# Patient Record
Sex: Female | Born: 1960 | Race: Black or African American | Hispanic: No | Smoking: Never smoker
Health system: Southern US, Community
[De-identification: ages and names within clinical notes are randomized; demographics above are authoritative.]

## PROBLEM LIST (undated history)

## (undated) DIAGNOSIS — G709 Myoneural disorder, unspecified: Secondary | ICD-10-CM

## (undated) DIAGNOSIS — E119 Type 2 diabetes mellitus without complications: Secondary | ICD-10-CM

## (undated) DIAGNOSIS — M1990 Unspecified osteoarthritis, unspecified site: Secondary | ICD-10-CM

## (undated) DIAGNOSIS — E785 Hyperlipidemia, unspecified: Secondary | ICD-10-CM

## (undated) DIAGNOSIS — I10 Essential (primary) hypertension: Secondary | ICD-10-CM

## (undated) DIAGNOSIS — Z9989 Dependence on other enabling machines and devices: Secondary | ICD-10-CM

## (undated) DIAGNOSIS — F419 Anxiety disorder, unspecified: Secondary | ICD-10-CM

## (undated) DIAGNOSIS — R0902 Hypoxemia: Secondary | ICD-10-CM

## (undated) DIAGNOSIS — T7840XA Allergy, unspecified, initial encounter: Secondary | ICD-10-CM

## (undated) DIAGNOSIS — I219 Acute myocardial infarction, unspecified: Secondary | ICD-10-CM

## (undated) DIAGNOSIS — G4730 Sleep apnea, unspecified: Secondary | ICD-10-CM

## (undated) DIAGNOSIS — E559 Vitamin D deficiency, unspecified: Secondary | ICD-10-CM

## (undated) DIAGNOSIS — E079 Disorder of thyroid, unspecified: Secondary | ICD-10-CM

## (undated) HISTORY — DX: Hypoxemia: R09.02

## (undated) HISTORY — DX: Acute myocardial infarction, unspecified: I21.9

## (undated) HISTORY — DX: Anxiety disorder, unspecified: F41.9

## (undated) HISTORY — DX: Hyperlipidemia, unspecified: E78.5

## (undated) HISTORY — DX: Allergy, unspecified, initial encounter: T78.40XA

## (undated) HISTORY — DX: Disorder of thyroid, unspecified: E07.9

## (undated) HISTORY — DX: Myoneural disorder, unspecified: G70.9

## (undated) HISTORY — DX: Sleep apnea, unspecified: G47.30

## (undated) HISTORY — DX: Vitamin D deficiency, unspecified: E55.9

## (undated) HISTORY — PX: TONSILLECTOMY: SUR1361

## (undated) HISTORY — DX: Dependence on other enabling machines and devices: Z99.89

## (undated) HISTORY — PX: ABDOMINAL HYSTERECTOMY: SHX81

## (undated) HISTORY — PX: TRIGGER FINGER RELEASE: SHX641

## (undated) HISTORY — DX: Type 2 diabetes mellitus without complications: E11.9

## (undated) HISTORY — DX: Unspecified osteoarthritis, unspecified site: M19.90

## (undated) HISTORY — PX: TOOTH EXTRACTION: SUR596

## (undated) HISTORY — DX: Essential (primary) hypertension: I10

---

## 2018-02-25 DIAGNOSIS — R5383 Other fatigue: Secondary | ICD-10-CM | POA: Diagnosis not present

## 2018-02-25 DIAGNOSIS — E7800 Pure hypercholesterolemia, unspecified: Secondary | ICD-10-CM | POA: Diagnosis not present

## 2018-02-25 DIAGNOSIS — E119 Type 2 diabetes mellitus without complications: Secondary | ICD-10-CM | POA: Diagnosis not present

## 2018-02-25 DIAGNOSIS — I10 Essential (primary) hypertension: Secondary | ICD-10-CM | POA: Diagnosis not present

## 2018-03-04 DIAGNOSIS — E7800 Pure hypercholesterolemia, unspecified: Secondary | ICD-10-CM | POA: Diagnosis not present

## 2018-03-04 DIAGNOSIS — E032 Hypothyroidism due to medicaments and other exogenous substances: Secondary | ICD-10-CM | POA: Diagnosis not present

## 2018-03-04 DIAGNOSIS — I10 Essential (primary) hypertension: Secondary | ICD-10-CM | POA: Diagnosis not present

## 2018-03-04 DIAGNOSIS — E119 Type 2 diabetes mellitus without complications: Secondary | ICD-10-CM | POA: Diagnosis not present

## 2018-05-13 DIAGNOSIS — I10 Essential (primary) hypertension: Secondary | ICD-10-CM | POA: Diagnosis not present

## 2018-05-13 DIAGNOSIS — B029 Zoster without complications: Secondary | ICD-10-CM | POA: Diagnosis not present

## 2018-10-07 DIAGNOSIS — E0590 Thyrotoxicosis, unspecified without thyrotoxic crisis or storm: Secondary | ICD-10-CM | POA: Diagnosis not present

## 2018-10-07 DIAGNOSIS — Z9989 Dependence on other enabling machines and devices: Secondary | ICD-10-CM | POA: Diagnosis not present

## 2018-10-07 DIAGNOSIS — I10 Essential (primary) hypertension: Secondary | ICD-10-CM | POA: Diagnosis not present

## 2018-10-07 DIAGNOSIS — R5383 Other fatigue: Secondary | ICD-10-CM | POA: Diagnosis not present

## 2018-10-07 DIAGNOSIS — E7800 Pure hypercholesterolemia, unspecified: Secondary | ICD-10-CM | POA: Diagnosis not present

## 2018-10-07 DIAGNOSIS — E876 Hypokalemia: Secondary | ICD-10-CM | POA: Diagnosis not present

## 2018-10-07 DIAGNOSIS — E119 Type 2 diabetes mellitus without complications: Secondary | ICD-10-CM | POA: Diagnosis not present

## 2018-10-07 LAB — TSH: TSH: 0.03 — AB (ref <=5.90)

## 2018-12-24 DIAGNOSIS — H10029 Other mucopurulent conjunctivitis, unspecified eye: Secondary | ICD-10-CM | POA: Diagnosis not present

## 2018-12-24 DIAGNOSIS — R05 Cough: Secondary | ICD-10-CM | POA: Diagnosis not present

## 2018-12-24 DIAGNOSIS — H6693 Otitis media, unspecified, bilateral: Secondary | ICD-10-CM | POA: Diagnosis not present

## 2018-12-24 DIAGNOSIS — E119 Type 2 diabetes mellitus without complications: Secondary | ICD-10-CM | POA: Diagnosis not present

## 2019-02-18 DIAGNOSIS — M79645 Pain in left finger(s): Secondary | ICD-10-CM | POA: Diagnosis not present

## 2019-02-18 DIAGNOSIS — M79644 Pain in right finger(s): Secondary | ICD-10-CM | POA: Diagnosis not present

## 2019-02-18 DIAGNOSIS — E119 Type 2 diabetes mellitus without complications: Secondary | ICD-10-CM | POA: Diagnosis not present

## 2019-02-23 ENCOUNTER — Encounter: Payer: Self-pay | Admitting: Family Medicine

## 2019-02-28 ENCOUNTER — Other Ambulatory Visit: Payer: Self-pay

## 2019-02-28 ENCOUNTER — Encounter: Payer: Self-pay | Admitting: Family Medicine

## 2019-02-28 ENCOUNTER — Ambulatory Visit (INDEPENDENT_AMBULATORY_CARE_PROVIDER_SITE_OTHER): Payer: BLUE CROSS/BLUE SHIELD | Admitting: Family Medicine

## 2019-02-28 ENCOUNTER — Telehealth: Payer: Self-pay | Admitting: Family Medicine

## 2019-02-28 VITALS — BP 128/79 | HR 96 | Temp 98.2°F | Resp 17 | Ht 64.57 in | Wt 211.8 lb

## 2019-02-28 DIAGNOSIS — Z1211 Encounter for screening for malignant neoplasm of colon: Secondary | ICD-10-CM

## 2019-02-28 DIAGNOSIS — E039 Hypothyroidism, unspecified: Secondary | ICD-10-CM | POA: Diagnosis not present

## 2019-02-28 DIAGNOSIS — E119 Type 2 diabetes mellitus without complications: Secondary | ICD-10-CM

## 2019-02-28 DIAGNOSIS — G4733 Obstructive sleep apnea (adult) (pediatric): Secondary | ICD-10-CM | POA: Diagnosis not present

## 2019-02-28 DIAGNOSIS — Z1389 Encounter for screening for other disorder: Secondary | ICD-10-CM | POA: Diagnosis not present

## 2019-02-28 DIAGNOSIS — I10 Essential (primary) hypertension: Secondary | ICD-10-CM

## 2019-02-28 LAB — GLUCOSE, POCT (MANUAL RESULT ENTRY): POC Glucose: 143 mg/dl — AB (ref 70–99)

## 2019-02-28 LAB — POCT URINALYSIS DIP (CLINITEK)
Bilirubin, UA: NEGATIVE
Blood, UA: NEGATIVE
Glucose, UA: 500 mg/dL — AB
Ketones, POC UA: NEGATIVE mg/dL
LEUKOCYTES UA: NEGATIVE
Nitrite, UA: NEGATIVE
POC PROTEIN,UA: NEGATIVE
Spec Grav, UA: 1.025 (ref 1.010–1.025)
Urobilinogen, UA: 0.2 E.U./dL
pH, UA: 5.5 (ref 5.0–8.0)

## 2019-02-28 MED ORDER — FREESTYLE LIBRE SENSOR SYSTEM MISC: 3 refills | Status: DC

## 2019-02-28 MED ORDER — AMLODIPINE BESYLATE 10 MG PO TABS: 10.0000 mg | ORAL_TABLET | Freq: Every day | ORAL | 3 refills | Status: DC

## 2019-02-28 NOTE — Progress Notes (Signed)
Melanie Steele, is a 59 y.o. female  MWN:[id number]  QIH:[id number]  DOB - 1961-02-10  CC:  Chief Complaint  Patient presents with  . Establish Care  . Diabetes  . Hypertension  . Hyperlipidemia       HPI: Melanie Steele is a 58 y.o. female is here today to establish care.   Melanie Steele has Essential hypertension; Type 2 diabetes mellitus without complication, without long-term current use of insulin ([place]); and Acquired hypothyroidism.  Recently relocated to [place]. Patient is employed with [employer]. Previously followed by [facility] in [place], [place]. Scheduled to have two surgeries to repair trigger fingers-followed by [facility]. Scheduled for an eye exam at [place].   Diabetes Last A1C 8.2 10/07/18. Current diabetes regimen includes Invokana and Victoza. She reports an intolerance of metformin. She has been prescribed current regimen for over 1 year.  She monitors her glucose at home and home readings have been between 120 and 140s.  She denies any prior history of renal disease. Diabetic eye exam was 06/15/17. Urine microalbumin 10/07/2018. Endorse occasional mild neuropathy. She suffered from a shingles outbreak last year and endorse neuropathic pain of her arm secondary to resolved shingle outbreak. She is prescribed Gabapentin as needed for pain.   Hypertension/Hyperlipidemia  Blood pressure currently managed with amlodipine 10 mg, hydrochlorothiazide 25 mg, metoprolol 50 mg.  She has a blood pressure cuff at home she does not routinely monitor her blood pressure.  She denies any headaches, dizziness, chest pain, weakness.  Endorses efforts to here to a low-sodium diet.  No routine physical activity however is making efforts to become more physically active. Current Body mass index is 35.72 kg/m.  She is currently on statin therapy for management of hyperlipidemia.    OSA  Patient was diagnosed with obstructive sleep apnea in 2009.  Was prescribed a CPAP  machine although endorses that the mask never fit appropriately for discontinued seeing the machine consistently.  She has not had a sleep study since 2009.  She request a referral today.  Other overdue health maintenance Colonscopy, interested in Cologuard. No family history of GI cancers or colon cancer. She suffers from acquired hypothyroidism which is currently managed by Armour 180 mg.  Last Thyroid study performed in October which was non- therapeutic with depressed levels of TSH.   Current medications: Current Outpatient Medications:  .  amLODipine (NORVASC) 10 MG tablet, Take 1 tablet by mouth daily., Disp: , Rfl:  .  canagliflozin (INVOKANA) 300 MG TABS tablet, Take 1 tablet by mouth daily., Disp: , Rfl:  .  Continuous Blood Gluc Sensor (Tranquillity) MISC, by Does not apply route., Disp: , Rfl:  .  EPINEPHrine 0.3 mg/0.3 mL IJ SOAJ injection, epinephrine 0.3 mg/0.3 mL injection, auto-injector  Inject 0.3 mg IM as needed, Disp: , Rfl:  .  fluticasone (FLONASE) 50 MCG/ACT nasal spray, Place 1 spray into both nostrils daily., Disp: , Rfl:  .  gabapentin (NEURONTIN) 100 MG capsule, Take 1 capsule by mouth 3 (three) times daily as needed., Disp: , Rfl:  .  glucose blood test strip, Dispense and use as prescribed., Disp: , Rfl:  .  hydrochlorothiazide (HYDRODIURIL) 25 MG tablet, Take 1 tablet by mouth daily., Disp: , Rfl:  .  Insulin Pen Needle (NOVOFINE) 30G X 8 MM MISC, by Does not apply route., Disp: , Rfl:  .  liraglutide (VICTOZA) 18 MG/3ML SOPN, Inject 0.3 mLs into the skin daily., Disp: , Rfl:  .  metoprolol succinate (TOPROL-XL) 50 MG 24 hr tablet, Take 1 tablet by mouth daily., Disp: , Rfl:  .  rosuvastatin (CRESTOR) 5 MG tablet, Take 1 tablet by mouth daily., Disp: , Rfl:  .  thyroid (ARMOUR THYROID) 180 MG tablet, Take 1 tablet by mouth every other day., Disp: , Rfl:  .  fexofenadine (ALLEGRA) 180 MG tablet, Take 1 tablet by mouth daily., Disp: , Rfl:     Pertinent family medical history: family history includes Diabetes in her mother; Hypertension in her mother and sister; Stroke in her father.   Allergies  Allergen Reactions  . Lisinopril Anaphylaxis  . Other Anaphylaxis  . Linagliptin Other (See Comments)    Upset stomach  . Metformin And Related Diarrhea    Social History   Socioeconomic History  . Marital status: Widowed    Spouse name: Not on file  . Number of children: Not on file  . Years of education: Not on file  . Highest education level: Not on file  Occupational History  . Not on file  Social Needs  . Financial resource strain: Not on file  . Food insecurity:    Worry: Not on file    Inability: Not on file  . Transportation needs:    Medical: Not on file    Non-medical: Not on file  Tobacco Use  . Smoking status: Never Smoker  . Smokeless tobacco: Never Used  Substance and Sexual Activity  . Alcohol use: Yes  . Drug use: Not on file  . Sexual activity: Not on file  Lifestyle  . Physical activity:    Days per week: Not on file    Minutes per session: Not on file  . Stress: Not on file  Relationships  . Social connections:    Talks on phone: Not on file    Gets together: Not on file    Attends religious service: Not on file    Active member of club or organization: Not on file    Attends meetings of clubs or organizations: Not on file    Relationship status: Not on file  . Intimate partner violence:    Fear of current or ex partner: Not on file    Emotionally abused: Not on file    Physically abused: Not on file    Forced sexual activity: Not on file  Other Topics Concern  . Not on file  Social History Narrative  . Not on file    Review of Systems: Pertinent negatives listed in HPI  Objective:  There were no vitals filed for this visit.  BP Readings from Last 3 Encounters:  No data found for BP    There were no vitals filed for this visit.    Physical Exam: Constitutional: Patient  appears well-developed and well-nourished. No distress. HENT: Normocephalic, atraumatic, External right and left ear normal. Oropharynx is clear and moist.  Eyes: Conjunctivae and EOM are normal. PERRLA, no scleral icterus. Neck: Normal ROM. Neck supple. No JVD. No tracheal deviation. No thyromegaly. CVS: RRR, S1/S2 +, no murmurs, no gallops, no carotid bruit.  Pulmonary: Effort and breath sounds normal, no stridor, rhonchi, wheezes, rales.  Abdominal: Soft. BS +, no distension, tenderness, rebound or guarding.  Musculoskeletal: Normal range of motion. No edema and no tenderness.  Neuro: Alert. Normal muscle tone coordination. Normal gait. BUE and BLE strength 5/5. Bilateral hand grips symmetrical. No cranial nerve deficit. Skin: Skin is warm and dry. No rash noted. Not diaphoretic. No erythema. No pallor.  Psychiatric: Normal mood and affect. Behavior, judgment, thought content normal.     Assessment and plan:  1. Type 2 diabetes mellitus without complication, without long-term current use of insulin (HCC) Last A1C 8.2. Recent home readings stable. No medication changes today. Continue Invokana and Victoza. Recommend engaging in routine physical activity with a goal of 150 minutes per week, increasing intake of vegetables, fruits, fiber, and selecting lean cuts of meat. Checking:   - Comprehensive metabolic panel - Hemoglobin A1c - Glucose (CBG) - CBC with Differential  2. Essential hypertension, Controlled  -Continue amlodipine, hydrochlorothiazide, and metoprolol We have discussed target BP range and blood pressure goal. I have advised patient to check BP regularly and to call us back or report to clinic if the numbers are consistently higher than 140/90. We discussed the importance of compliance with medical therapy and DASH diet recommended, consequences of uncontrolled hypertension discussed.   3. Acquired hypothyroidism Currently prescribed Armour Thyroid 180 mg once daily. -  TSH  4. Screen for colon cancer - Cologuard ordered.  5. Screening for blood or protein in urine - POCT URINALYSIS DIP (CLINITEK)  6. OSA, sleep study 2009.  Ordered a split night sleep study evaluation of CPAP.   Marland Kitchen Orders Placed This Encounter  Procedures  . Comprehensive metabolic panel  . Hemoglobin A1c  . CBC with Differential  . TSH  . TSH    This external order was created through the Results Console.  . Cologuard  . Glucose (CBG)  . POCT URINALYSIS DIP (CLINITEK)    A total of 40 minutes spent, greater than 50 % of this time was spent counseling and coordination of care.   Return in about 3 months (around 05/31/2019) for diabetes and hypertenson .   The patient was given clear instructions to go to ER or return to medical center if symptoms don't improve, worsen or new problems develop. The patient verbalized understanding. The patient was advised  to call and obtain lab results if they haven't heard anything from out office within 7-10 business days.  Molli Barrows, FNP [facility] [address] [place] [address] [phone]: [phone]    This note has been created with Dragon speech recognition software and Engineer, materials. Any transcriptional errors are unintentional.

## 2019-02-28 NOTE — Telephone Encounter (Signed)
Contacted patient, stated she would come pick it up tomorrow.

## 2019-02-28 NOTE — Telephone Encounter (Signed)
Completed a work Quarry manager for patient. Patient thought she was signed up for mychart, however activation is incomplete. Please contact patient to inquire if she would like to pick-up letter or have the letter mailed.

## 2019-02-28 NOTE — Patient Instructions (Signed)
Thank you for choosing [facility] at [facility] for your medical home!    Melanie Steele was seen by Molli Barrows, FNP today.   Melanie Steele primary care doctor is Scot Jun, FNP.   For the best care possible,  you should try to see Molli Barrows, FNP-C  whenever you come to clinic.   We look forward to seeing you again soon!  If you have any questions about your visit today,  please call us at [phone]  Or feel free to reach your provider via [place].        Preventive Care 40-64 Years, Female Preventive care refers to lifestyle choices and visits with your health care provider that can promote health and wellness. What does preventive care include?   A yearly physical exam. This is also called an annual well check.  Dental exams once or twice a year.  Routine eye exams. Ask your health care provider how often you should have your eyes checked.  Personal lifestyle choices, including: ? Daily care of your teeth and gums. ? Regular physical activity. ? Eating a healthy diet. ? Avoiding tobacco and drug use. ? Limiting alcohol use. ? Practicing safe sex. ? Taking low-dose aspirin daily starting at age 28. ? Taking vitamin and mineral supplements as recommended by your health care provider. What happens during an annual well check? The services and screenings done by your health care provider during your annual well check will depend on your age, overall health, lifestyle risk factors, and family history of disease. Counseling Your health care provider may ask you questions about your:  Alcohol use.  Tobacco use.  Drug use.  Emotional well-being.  Home and relationship well-being.  Sexual activity.  Eating habits.  Work and work Statistician.  Method of birth control.  Menstrual cycle.  Pregnancy history. Screening You may have the following tests or measurements:  Height, weight, and BMI.  Blood pressure.  Lipid and  cholesterol levels. These may be checked every 5 years, or more frequently if you are over 36 years old.  Skin check.  Lung cancer screening. You may have this screening every year starting at age 44 if you have a 30-pack-year history of smoking and currently smoke or have quit within the past 15 years.  Colorectal cancer screening. All adults should have this screening starting at age 31 and continuing until age 76. Your health care provider may recommend screening at age 39. You will have tests every 1-10 years, depending on your results and the type of screening test. People at increased risk should start screening at an earlier age. Screening tests may include: ? Guaiac-based fecal occult blood testing. ? Fecal immunochemical test (FIT). ? Stool DNA test. ? Virtual colonoscopy. ? Sigmoidoscopy. During this test, a flexible tube with a tiny camera (sigmoidoscope) is used to examine your rectum and lower colon. The sigmoidoscope is inserted through your anus into your rectum and lower colon. ? Colonoscopy. During this test, a long, thin, flexible tube with a tiny camera (colonoscope) is used to examine your entire colon and rectum.  Hepatitis C blood test.  Hepatitis B blood test.  Sexually transmitted disease (STD) testing.  Diabetes screening. This is done by checking your blood sugar (glucose) after you have not eaten for a while (fasting). You may have this done every 1-3 years.  Mammogram. This may be done every 1-2 years. Talk to your health care provider about when you should start having regular mammograms. This may  depend on whether you have a family history of breast cancer.  BRCA-related cancer screening. This may be done if you have a family history of breast, ovarian, tubal, or peritoneal cancers.  Pelvic exam and Pap test. This may be done every 3 years starting at age 35. Starting at age 16, this may be done every 5 years if you have a Pap test in combination with an HPV  test.  Bone density scan. This is done to screen for osteoporosis. You may have this scan if you are at high risk for osteoporosis. Discuss your test results, treatment options, and if necessary, the need for more tests with your health care provider. Vaccines Your health care provider may recommend certain vaccines, such as:  Influenza vaccine. This is recommended every year.  Tetanus, diphtheria, and acellular pertussis (Tdap, Td) vaccine. You may need a Td booster every 10 years.  Varicella vaccine. You may need this if you have not been vaccinated.  Zoster vaccine. You may need this after age 52.  Measles, mumps, and rubella (MMR) vaccine. You may need at least one dose of MMR if you were born in 1957 or later. You may also need a second dose.  Pneumococcal 13-valent conjugate (PCV13) vaccine. You may need this if you have certain conditions and were not previously vaccinated.  Pneumococcal polysaccharide (PPSV23) vaccine. You may need one or two doses if you smoke cigarettes or if you have certain conditions.  Meningococcal vaccine. You may need this if you have certain conditions.  Hepatitis A vaccine. You may need this if you have certain conditions or if you travel or work in places where you may be exposed to hepatitis A.  Hepatitis B vaccine. You may need this if you have certain conditions or if you travel or work in places where you may be exposed to hepatitis B.  Haemophilus influenzae type b (Hib) vaccine. You may need this if you have certain conditions. Talk to your health care provider about which screenings and vaccines you need and how often you need them. This information is not intended to replace advice given to you by your health care provider. Make sure you discuss any questions you have with your health care provider. Document Released: 12/28/2015 Document Revised: 01/21/2018 Document Reviewed: 10/02/2015 Elsevier Interactive Patient Education  2019 [employer].

## 2019-03-01 ENCOUNTER — Telehealth: Payer: Self-pay | Admitting: Family Medicine

## 2019-03-01 LAB — CBC WITH DIFFERENTIAL/PLATELET
Basophils Absolute: 0.1 10*3/uL (ref 0.0–0.2)
Basos: 1 %
EOS (ABSOLUTE): 0.1 10*3/uL (ref 0.0–0.4)
Eos: 1 %
Hematocrit: 44 % (ref 34.0–46.6)
Hemoglobin: 14.7 g/dL (ref 11.1–15.9)
Immature Grans (Abs): 0 10*3/uL (ref 0.0–0.1)
Immature Granulocytes: 0 %
Lymphocytes Absolute: 3.1 10*3/uL (ref 0.7–3.1)
Lymphs: 30 %
MCH: 30.9 pg (ref 26.6–33.0)
MCHC: 33.4 g/dL (ref 31.5–35.7)
MCV: 93 fL (ref 79–97)
MONOCYTES: 6 %
Monocytes Absolute: 0.6 10*3/uL (ref 0.1–0.9)
Neutrophils Absolute: 6.4 10*3/uL (ref 1.4–7.0)
Neutrophils: 62 %
Platelets: 268 10*3/uL (ref 150–450)
RBC: 4.75 x10E6/uL (ref 3.77–5.28)
RDW: 12.4 % (ref 11.7–15.4)
WBC: 10.2 10*3/uL (ref 3.4–10.8)

## 2019-03-01 LAB — COMPREHENSIVE METABOLIC PANEL
ALT: 20 IU/L (ref 0–32)
AST: 16 IU/L (ref 0–40)
Albumin/Globulin Ratio: 1.3 (ref 1.2–2.2)
Albumin: 4.3 g/dL (ref 3.8–4.9)
Alkaline Phosphatase: 79 IU/L (ref 39–117)
BUN/Creatinine Ratio: 16 (ref 9–23)
BUN: 11 mg/dL (ref 6–24)
Bilirubin Total: 0.8 mg/dL (ref 0.0–1.2)
CALCIUM: 10.2 mg/dL (ref 8.7–10.2)
CO2: 22 mmol/L (ref 20–29)
Chloride: 102 mmol/L (ref 96–106)
Creatinine, Ser: 0.7 mg/dL (ref 0.57–1.00)
GFR calc Af Amer: 111 mL/min/{1.73_m2} (ref >59)
GFR calc non Af Amer: 96 mL/min/{1.73_m2} (ref >59)
Globulin, Total: 3.2 g/dL (ref 1.5–4.5)
Glucose: 130 mg/dL — ABNORMAL HIGH (ref 65–99)
Potassium: 3.5 mmol/L (ref 3.5–5.2)
Sodium: 142 mmol/L (ref 134–144)
TOTAL PROTEIN: 7.5 g/dL (ref 6.0–8.5)

## 2019-03-01 LAB — TSH: TSH: 11.78 u[IU]/mL — AB (ref 0.450–4.500)

## 2019-03-01 LAB — HEMOGLOBIN A1C
Est. average glucose Bld gHb Est-mCnc: 180 mg/dL
Hgb A1c MFr Bld: 7.9 % — ABNORMAL HIGH (ref 4.8–5.6)

## 2019-03-01 NOTE — Telephone Encounter (Signed)
Signed order for [facility]. Please complete billing and demographic information and fax to number highlighted on the form

## 2019-03-02 ENCOUNTER — Other Ambulatory Visit: Payer: Self-pay

## 2019-03-02 DIAGNOSIS — I10 Essential (primary) hypertension: Secondary | ICD-10-CM | POA: Insufficient documentation

## 2019-03-02 DIAGNOSIS — E890 Postprocedural hypothyroidism: Secondary | ICD-10-CM | POA: Insufficient documentation

## 2019-03-02 DIAGNOSIS — E119 Type 2 diabetes mellitus without complications: Secondary | ICD-10-CM | POA: Insufficient documentation

## 2019-03-02 DIAGNOSIS — E039 Hypothyroidism, unspecified: Secondary | ICD-10-CM | POA: Insufficient documentation

## 2019-03-02 MED ORDER — HYDROCHLOROTHIAZIDE 25 MG PO TABS: 25.0000 mg | ORAL_TABLET | ORAL | 1 refills | Status: DC

## 2019-03-02 MED ORDER — POTASSIUM CHLORIDE CRYS ER 20 MEQ PO TBCR: 20.0000 meq | EXTENDED_RELEASE_TABLET | Freq: Every day | ORAL | 1 refills | Status: DC

## 2019-03-02 NOTE — Telephone Encounter (Signed)
Form was completed and faxed 03/01/2019.

## 2019-03-03 ENCOUNTER — Encounter: Payer: Self-pay | Admitting: Family Medicine

## 2019-03-04 ENCOUNTER — Encounter: Payer: Self-pay | Admitting: Family Medicine

## 2019-03-07 ENCOUNTER — Telehealth: Payer: Self-pay

## 2019-03-07 NOTE — Telephone Encounter (Signed)
Received fax from [facility] stating that sample could not be processed because they received an empty collection kit. They have contacted patient to initiate a new sample collection.

## 2019-03-08 DIAGNOSIS — H9312 Tinnitus, left ear: Secondary | ICD-10-CM | POA: Diagnosis not present

## 2019-03-08 DIAGNOSIS — H9313 Tinnitus, bilateral: Secondary | ICD-10-CM | POA: Diagnosis not present

## 2019-03-09 DIAGNOSIS — M65311 Trigger thumb, right thumb: Secondary | ICD-10-CM | POA: Diagnosis not present

## 2019-03-09 DIAGNOSIS — M65322 Trigger finger, left index finger: Secondary | ICD-10-CM | POA: Diagnosis not present

## 2019-03-28 ENCOUNTER — Telehealth: Payer: Self-pay

## 2019-03-28 NOTE — Telephone Encounter (Signed)
Due to current COVID 19 pandemic, our office is severely reducing in office visits for at least the next 2 weeks, in order to minimize the risk to our patients and healthcare providers.   I called pt, offered her a virtual visit with Dr. Rexene Alberts. Pt is agreeable to a virtual visit.  Pt understands that although there may be some limitations with this type of visit, we will take all precautions to reduce any security or privacy concerns.  Pt understands that this will be treated like an in office visit and we will file with pt's insurance, and there may be a patient responsible charge related to this service.  Pt's email is [email or web address]. Pt understands that the cisco webex software must be downloaded and operational on the device pt plans to use for the visit.  Pt's meds, allergies, and PMH were updated.  Pt was instructed on how to measure her neck size prior to her appt.  Pt reports that she had a sleep study in 2009. Pt has a cpap but has been unable to use it because she needs supplies. She does not have a DME.  Epworth Sleepiness Scale 0= would never doze 1= slight chance of dozing 2= moderate chance of dozing 3= high chance of dozing  Sitting and reading: 2 Watching TV: 2 Sitting inactive in a public place (ex. Theater or meeting): 0 As a passenger in a car for an hour without a break: 0 Lying down to rest in the afternoon: 2 Sitting and talking to someone: 0 Sitting quietly after lunch (no alcohol): 0 In a car, while stopped in traffic: 0 Total: 6  FSS: 33

## 2019-03-29 ENCOUNTER — Encounter: Payer: Self-pay | Admitting: Family Medicine

## 2019-03-29 ENCOUNTER — Other Ambulatory Visit: Payer: Self-pay

## 2019-03-29 MED ORDER — ROSUVASTATIN CALCIUM 5 MG PO TABS: 5.0000 mg | ORAL_TABLET | ORAL | 1 refills | Status: DC

## 2019-03-29 MED ORDER — METOPROLOL SUCCINATE ER 50 MG PO TB24: 50.0000 mg | ORAL_TABLET | Freq: Every day | ORAL | 1 refills | Status: DC

## 2019-03-29 MED ORDER — LIRAGLUTIDE 18 MG/3ML ~~LOC~~ SOPN: 1.8000 mg | PEN_INJECTOR | Freq: Every day | SUBCUTANEOUS | 1 refills | Status: DC

## 2019-03-29 MED ORDER — THYROID 180 MG PO TABS: 180.0000 mg | ORAL_TABLET | ORAL | 1 refills | Status: DC

## 2019-03-29 MED ORDER — CANAGLIFLOZIN 300 MG PO TABS: 300.0000 mg | ORAL_TABLET | Freq: Every day | ORAL | 1 refills | Status: DC

## 2019-03-29 MED ORDER — INSULIN PEN NEEDLE 30G X 8 MM MISC: 1 refills | Status: DC

## 2019-03-29 MED ORDER — THYROID 180 MG PO TABS: 180.0000 mg | ORAL_TABLET | Freq: Every day | ORAL | 1 refills | Status: DC

## 2019-03-29 NOTE — Addendum Note (Signed)
Addended by: Carylon Perches on: 03/29/2019 05:25 PM   Modules accepted: Orders

## 2019-04-12 ENCOUNTER — Ambulatory Visit (INDEPENDENT_AMBULATORY_CARE_PROVIDER_SITE_OTHER): Payer: BLUE CROSS/BLUE SHIELD | Admitting: Neurology

## 2019-04-12 ENCOUNTER — Encounter: Payer: Self-pay | Admitting: Neurology

## 2019-04-12 ENCOUNTER — Other Ambulatory Visit: Payer: Self-pay

## 2019-04-12 DIAGNOSIS — G4733 Obstructive sleep apnea (adult) (pediatric): Secondary | ICD-10-CM | POA: Diagnosis not present

## 2019-04-12 DIAGNOSIS — E669 Obesity, unspecified: Secondary | ICD-10-CM | POA: Diagnosis not present

## 2019-04-12 DIAGNOSIS — R51 Headache: Secondary | ICD-10-CM | POA: Diagnosis not present

## 2019-04-12 DIAGNOSIS — R519 Headache, unspecified: Secondary | ICD-10-CM

## 2019-04-12 DIAGNOSIS — R351 Nocturia: Secondary | ICD-10-CM

## 2019-04-12 NOTE — Patient Instructions (Signed)
Given verbally, during today's virtual video-based encounter, with verbal feedback received.   

## 2019-04-12 NOTE — Progress Notes (Signed)
Star Age, MD, PhD [facility] [address], [address] [place], [place] [address]   Virtual Visit via Video Note on 04/12/2019:  I connected with Ms. Rodenbaugh on 04/12/19 at  1:30 PM EDT by a video enabled telemedicine application and verified that I am speaking with the correct person using two identifiers.   I discussed the limitations of evaluation and management by telemedicine and the availability of in person appointments. The patient expressed understanding and agreed to proceed.  History of Present Illness:  Ms. Aizza Santiago is a 58 year old right-handed woman with an underlying medical history of vitamin D deficiency, diabetes, hypertension, hyperlipidemia, and obesity, with whom I am conducting a virtual, video based new patient visit via Webex in lieu of a face-to-face visit for evaluation of her sleep disorder, in particular, reevaluation of her prior diagnosis of obstructive sleep apnea. The patient is unaccompanied today and joins via laptop from home. I'm in my office. She is referred by Alphonse Guild, PA and I reviewed her office visit note from 10/07/2018.   She was diagnosed with obstructive sleep apnea over 10 years ago,and while she has been on CPAP therapy, she has not used her machine in the past couple of years she estimates. She had some difficulty obtaining supplies andfollowing up on a regular basis he admits. She had stressors in her life and recently lost her mother and grandmother within close proximity in time from each other and also moved from [place] to [place]. She has one son lives with her, age 1, her daughter is in [place], early 67s. Patient works for [employer], currently working from home because of virus pandemic. Her bedtime is generally around 11, rise time around 7. She has nocturia about once to up to 3 times per average night, she has had rare morning headaches. She has 1 small dog in the household, not in the bed  with her at night. She has a TV in the bedroom but turns it off at night. She is widowed, nonsmoker, drinks caffeine in limitation, about one cup per day and alcohol maybe once a week. She had a tonsillectomy in 2009 based on her sleep study showing severe sleep apnea. She also has a history of anaphylaxis. Her CPAP machine is the original machine, it is a ResMed S9.  Her Epworth sleepiness score is 6 out of 24, fatigue severity score is 33 out of 63.    Her Past Medical History Is Significant For: Past Medical History:  Diagnosis Date   CPAP (continuous positive airway pressure) dependence    Hyperlipidemia    Hypertension    Sleep apnea    Type 2 diabetes mellitus (HCC)    Vitamin D deficiency     Her Past Surgical History Is Significant For:  Her Family History Is Significant For: Family History  Problem Relation Age of Onset   Diabetes Mother    Hypertension Mother    Stroke Father    Hypertension Sister     Her Social History Is Significant For: Social History   Socioeconomic History   Marital status: Widowed    Spouse name: Not on file   Number of children: Not on file   Years of education: Not on file   Highest education level: Not on file  Occupational History   Not on file  Social Needs   Financial resource strain: Not on file   Food insecurity:    Worry: Not on file  Inability: Not on file   Transportation needs:    Medical: Not on file    Non-medical: Not on file  Tobacco Use   Smoking status: Never Smoker   Smokeless tobacco: Never Used  Substance and Sexual Activity   Alcohol use: Yes   Drug use: Not on file   Sexual activity: Not on file  Lifestyle   Physical activity:    Days per week: Not on file    Minutes per session: Not on file   Stress: Not on file  Relationships   Social connections:    Talks on phone: Not on file    Gets together: Not on file    Attends religious service: Not on file    Active member of  club or organization: Not on file    Attends meetings of clubs or organizations: Not on file    Relationship status: Not on file  Other Topics Concern   Not on file  Social History Narrative   Not on file    Her Allergies Are:  Allergies  Allergen Reactions   Lisinopril Anaphylaxis   Other Anaphylaxis    Tree nuts   Linagliptin Other (See Comments)    Upset stomach   Metformin And Related Diarrhea  :   Her Current Medications Are:  Outpatient Encounter Medications as of 04/12/2019  Medication Sig   amLODipine (NORVASC) 10 MG tablet Take 1 tablet (10 mg total) by mouth daily.   canagliflozin (INVOKANA) 300 MG TABS tablet Take 1 tablet (300 mg total) by mouth daily.   Continuous Blood Gluc Sensor ([place]) MISC Apply one sensor every 14 days.   EPINEPHrine 0.3 mg/0.3 mL IJ SOAJ injection epinephrine 0.3 mg/0.3 mL injection, auto-injector  Inject 0.3 mg IM as needed   fexofenadine (ALLEGRA) 180 MG tablet Take 1 tablet by mouth every other day.    fluticasone (FLONASE) 50 MCG/ACT nasal spray Place 1 spray into both nostrils daily.   gabapentin (NEURONTIN) 100 MG capsule Take 1 capsule by mouth 3 (three) times daily as needed.   hydrochlorothiazide (HYDRODIURIL) 25 MG tablet Take 1 tablet (25 mg total) by mouth every other day.   Insulin Pen Needle (NOVOFINE) 30G X 8 MM MISC Use daily for Victoza injections   liraglutide (VICTOZA) 18 MG/3ML SOPN Inject 0.3 mLs (1.8 mg total) into the skin daily.   metoprolol succinate (TOPROL-XL) 50 MG 24 hr tablet Take 1 tablet (50 mg total) by mouth daily.   potassium chloride SA (K-DUR,KLOR-CON) 20 MEQ tablet Take 1 tablet (20 mEq total) by mouth daily.   rosuvastatin (CRESTOR) 5 MG tablet Take 1 tablet (5 mg total) by mouth every other day.   thyroid (ARMOUR THYROID) 180 MG tablet Take 1 tablet (180 mg total) by mouth daily.   No facility-administered encounter medications on file as of 04/12/2019.    :   Review of Systems:  Out of a complete 14 point review of systems, all are reviewed and negative with the exception of these symptoms as listed below:  Observations/Objective: The most recent viewable vital signs in the chart are from 02/28/2019: Blood pressure 128/79 with pulse of 96, weight 211 pounds for a BMI of 35.72.     Her most recent weight at home by self report is 214 lb, neck circumference by self-report is 15 inches. On examination, she is pleasant, conversant, in no acute distress, language skills normal, speech is clear without dysarthria, hypophonia or voice tremor noted. Face is symmetric with  normal facial animation. She wears corrective eyeglasses. Extraocular movements are preserved in all directions. She has no obvious nystagmus. Airway examination reveals a moderately crowded airway secondary to smaller airway opening and wider uvula. She has no obvious tonsils. Mallampati class II. Hearing is grossly intact. She is able to stand without difficulty and Romberg is negative. She has no drift, no postural or action tremor and finger to nose testing shows no dysmetria or intention tremor. Fine motor skills are preserved. She walks without difficulty.  Assessment and Plan: Ms. Gratia Disla is a 58 year old right-handed woman with an underlying medical history of vitamin D deficiency, diabetes, hypertension, hyperlipidemia, and obesity, with whom I am conducting a virtual, video based new patient visit via Webex in lieu of a face-to-face visit for evaluation of an underlying organic sleep disorder, in particular, concern for obstructive sleep apnea. The patient's medical history and physical exam (albeit limited with current video-based evaluation) are concerning for a diagnosis of obstructive sleep apnea.she carries a prior diagnosis of what sounds like severe sleep apnea, she recalls that she stop breathing about 70 times per hour. She would be willing to get tested again and  consider CPAP or AutoPap therapy. I discussed with the patient the diagnosis of OSA, its prognosis and treatment options. I explained in particular the risks and ramifications of untreated moderate to severe OSA, especially with respect to developing cardiovascular disease down the Road, including congestive heart failure, difficult to treat hypertension, cardiac arrhythmias, or stroke. Even type 2 diabetes has, in part, been linked to untreated OSA. Symptoms of untreated OSA may include daytime sleepiness, memory problems, mood irritability and mood disorder such as depression and anxiety, lack of energy, as well as recurrent headaches, especially morning headaches. We talked about the importance of weight control. We talked about the importance of maintaining good sleep hygiene. I recommended the following at this time: home sleep test.  I explained the sleep test procedure to the patient and also outlined possible treatment options of OSA, including the use of a custom-made dental device (which would require a referral to a specialist dentist), upper airway surgical options, (such as UPPP, which would involve a referral to an ENT). I also explained the CPAP vs. AutoPAP treatment option to the patient, who indicated that she would be willing to try CPAP if the need arises. I answered all her questions today and the patient was in agreement. I plan to see the patient back after the sleep study is completed and encouraged her to call with any interim questions, concerns, problems or updates.   Star Age, MD, PhD    Follow Up Instructions: 1. HST, sleep lab staff will reach out to patient to arrange for either sending the unit to home address or a "drive by pickup" and for tutorial, making sure patient is comfortable with the unit and usage, and return of equipment, if necessary.  2. Consider AutoPap therapy, if home sleep test positive for obstructive sleep apnea, patient agreeable. 3. We talked about  alternative treatment options and current limitations, due to virus pandemic.  4. Follow-up after starting AutoPap therapy, follow-up to be scheduled according to set-up date, typically within 31 to 89 days post treatment start. 5. Pursue healthy lifestyle, good sleep hygiene, healthy weight. 6. Call for any interim questions or concerns.    I discussed the assessment and treatment plan with the patient. The patient was provided an opportunity to ask questions and all were answered. The patient agreed with the  plan and demonstrated an understanding of the instructions.   The patient was advised to call back or seek an in-person evaluation if the symptoms worsen or if the condition fails to improve as anticipated.  I provided 30 minutes of non-face-to-face time during this encounter.   Star Age, MD

## 2019-05-10 ENCOUNTER — Telehealth: Payer: Self-pay | Admitting: Family Medicine

## 2019-05-10 ENCOUNTER — Encounter: Payer: Self-pay | Admitting: Neurology

## 2019-05-10 MED ORDER — GABAPENTIN 100 MG PO CAPS: 100.0000 mg | ORAL_CAPSULE | Freq: Three times a day (TID) | ORAL | 1 refills | Status: DC | PRN

## 2019-05-10 NOTE — Telephone Encounter (Signed)
Caller Name: Melanie Steele  Reason for Call:  Medication refill for gabapentin (NEURONTIN) 100 MG capsule [[id number]]    If this is a medication request: confirm pharmacy  [id number] - [place], [place]. Verify call back number: [phone]   Action taken by recipient of request:

## 2019-05-26 DIAGNOSIS — M65322 Trigger finger, left index finger: Secondary | ICD-10-CM | POA: Diagnosis not present

## 2019-05-31 ENCOUNTER — Ambulatory Visit: Payer: BLUE CROSS/BLUE SHIELD | Admitting: Family Medicine

## 2019-06-01 ENCOUNTER — Ambulatory Visit (INDEPENDENT_AMBULATORY_CARE_PROVIDER_SITE_OTHER): Payer: BC Managed Care – PPO | Admitting: Neurology

## 2019-06-01 DIAGNOSIS — R351 Nocturia: Secondary | ICD-10-CM

## 2019-06-01 DIAGNOSIS — G4733 Obstructive sleep apnea (adult) (pediatric): Secondary | ICD-10-CM

## 2019-06-01 DIAGNOSIS — E669 Obesity, unspecified: Secondary | ICD-10-CM

## 2019-06-01 DIAGNOSIS — R519 Headache, unspecified: Secondary | ICD-10-CM

## 2019-06-08 ENCOUNTER — Telehealth: Payer: Self-pay

## 2019-06-08 NOTE — Progress Notes (Signed)
Patient referred by Alphonse Guild, PA, seen virtually by me on 04/12/19, HST on 06/01/19.    Please call and notify the patient that the recent home sleep test showed obstructive sleep apnea in the moderate to severe range. While I recommend treatment for this in the form CPAP, we are not yet bringing patients in for in-lab testing for CPAP titration studies, due to the virus pandemic; therefore, I suggest we start her on autoPAP at home, which means, that we don't have to bring her in for a sleep study with CPAP, but will let her start using an autoPAP machine at home, through a DME company (of patient's choice, or as per insurance requirement, as per in [employer], if there are such restrictions, depending on insurance carrier). The DME representative will educate the patient on how to use the machine, how to put the mask on, etc. I have placed an order in the chart. Please send referral, talk to patient, send report to referring MD. We will need a FU in sleep clinic for 10 weeks post-PAP set up, please arrange that with me or one of our NPs.  Also, please remind patient about the importance of compliance with PAP usage; this is an Designer, industrial/product, but good compliance also helps [place] track improvements in patient's sleep related complaints and objective improvements, such as BP and weight for example or nocturia or headaches, etc. For concerns and questions about how to clean the PAP machine and the supplies and how frequently to change the hose, mask and filters, etc., patient can call the DME company, for more information, education and troubleshooting. Especially in the current situation, I recommend, patients be extra mindful about hand hygiene, handling the PAP equipment only with clean hands, wipe the mask daily, keep little one and four-legged companions (and any other pets for that matter) away from the machine and mask at all times.    Thanks,   Star Age, MD, PhD [facility])

## 2019-06-08 NOTE — Telephone Encounter (Signed)
-----   Message from Star Age, MD sent at 06/08/2019  7:48 AM EDT ----- Patient referred by Alphonse Guild, PA, seen virtually by me on 04/12/19, HST on 06/01/19.    Please call and notify the patient that the recent home sleep test showed obstructive sleep apnea in the moderate to severe range. While I recommend treatment for this in the form CPAP, we are not yet bringing patients in for in-lab testing for CPAP titration studies, due to the virus pandemic; therefore, I suggest we start her on autoPAP at home, which means, that we don't have to bring her in for a sleep study with CPAP, but will let her start using an autoPAP machine at home, through a DME company (of patient's choice, or as per insurance requirement, as per in [employer], if there are such restrictions, depending on insurance carrier). The DME representative will educate the patient on how to use the machine, how to put the mask on, etc. I have placed an order in the chart. Please send referral, talk to patient, send report to referring MD. We will need a FU in sleep clinic for 10 weeks post-PAP set up, please arrange that with me or one of our NPs.  Also, please remind patient about the importance of compliance with PAP usage; this is an Designer, industrial/product, but good compliance also helps [place] track improvements in patient's sleep related complaints and objective improvements, such as BP and weight for example or nocturia or headaches, etc. For concerns and questions about how to clean the PAP machine and the supplies and how frequently to change the hose, mask and filters, etc., patient can call the DME company, for more information, education and troubleshooting. Especially in the current situation, I recommend, patients be extra mindful about hand hygiene, handling the PAP equipment only with clean hands, wipe the mask daily, keep little one and four-legged companions (and any other pets for that matter) away from the machine and mask at all  times.     Thanks,   Star Age, MD, PhD [facility])

## 2019-06-08 NOTE — Telephone Encounter (Signed)
I called pt. I advised pt that Dr. Rexene Alberts reviewed their sleep study results and found that pt has moderate to severe osa. Dr. Rexene Alberts recommends that pt start an auto pap at home. I reviewed PAP compliance expectations with the pt. Pt is agreeable to starting an auto-PAP. I advised pt that an order will be sent to a DME, [facility], and [facility] will call the pt within about one week after they file with the pt's insurance. [facility] will show the pt how to use the machine, fit for masks, and troubleshoot the auto-PAP if needed. A follow up appt was made for insurance purposes with Dr. Rexene Alberts on 08/30/2019 at 10:30am. Pt verbalized understanding to arrive 15 minutes early and bring their auto-PAP. A letter with all of this information in it will be mailed to the pt as a reminder. I verified with the pt that the address we have on file is correct. Pt verbalized understanding of results. Pt had no questions at this time but was encouraged to call back if questions arise. I have sent the order to [facility] and have received confirmation that they have received the order.

## 2019-06-08 NOTE — Addendum Note (Signed)
Addended by: Star Age on: 06/08/2019 07:48 AM   Modules accepted: Orders

## 2019-06-08 NOTE — Procedures (Signed)
Patient Information     First Name: Melanie Last Name: Steele ID: [id number]  Birth Date: 22-May-1961 Age: 58 Gender: Female  Referring Provider: Alphonse Guild, PA BMI: 35.3 (W=211 lb, H=5' 5'')  Neck Circ.:  15 '' Epworth:  6/24   Sleep Study Information    Study Date: Jun 01, 2019 S/H/A Version: 001.001.001.001 / 4.1.1528 / 54  History: 58 year old woman with a history of vitamin D deficiency, diabetes, hypertension, hyperlipidemia, and obesity, who presents for re-evaluation of her OSA. She has not used her CPAP machine in the past couple of years.  Summary & Diagnosis:       OSA Recommendations:       This home sleep test demonstrates moderate/near severe obstructive sleep apnea with a total AHI of 29.4/hour and O2 nadir of 81%. Treatment with positive airway pressure (PAP) - in the form of CPAP - is recommended. This will require, ideally, a full night CPAP titration study for proper treatment settings, O2 monitoring and mask fitting. Based on the severity of the sleep disordered breathing, an attended titration study is indicated. However, under the current circumstances (i.e. the COVID-19 pandemic), in order to ensure continuity of care and for the safety of the patient and healthcare professionals, she will be advised to proceed with an autoPAP titration/trial at home. A proper overnight, lab-attended PAP titration study with CPAP may be helpful or needed down the road to optimize treatment, when considered safe. Please note, that untreated obstructive sleep apnea may carry additional perioperative morbidity. Patients with significant obstructive sleep apnea should receive perioperative PAP therapy and the surgeons and particularly the anesthesiologist should be informed of the diagnosis and the severity of the sleep disordered breathing. Patient will be reminded regarding compliance with the PAP machine and to be mindful of cleanliness with the equipment and timely with supply changes (i.e. changing  filter, mask, hose, humidifier chamber on an ongoing basis, as recommended, and cleaning parts that touch the face and nose daily, etc). The patient should be cautioned not to drive, work at heights, or operate dangerous or heavy equipment when tired or sleepy. Review and reiteration of good sleep hygiene measures should be pursued with any patient. Other causes of the patient's symptoms, including circadian rhythm disturbances, an underlying mood disorder, medication effect and/or an underlying medical problem cannot be ruled out based on this test. Clinical correlation is recommended. The patient and her referring provider will be notified of the test results. The patient will be seen in follow up in sleep clinic at [facility], either for a face-to-face or virtual visit, whichever feasible and recommended at the time.  I certify that I have reviewed the raw data recording prior to the issuance of this report in accordance with the standards of the American Academy of Sleep Medicine (AASM).  Star Age, MD, PhD [facility]) Diplomat, ABPN (Neurology and Sleep)        Sleep Summary    Oxygen Saturation Statistics     Start Study Time: End Study Time: Total Recording Time:  11:58:29 PM          7:17:43 AM   7 h, 19 min  Total Sleep Time % REM of Sleep Time:  6 h, 7 min  25.3    Mean: 93 Minimum: 81 Maximum: 98  Mean of Desaturations Nadirs (%):   89  Oxygen Desaturation %:  4-9 10-20 >20 Total  Events Number Total   107 90.7   11  0  9.3 0.0  118 100.0  Oxygen Saturation: <90 <=88  <85 <80 <70  Duration (minutes): Sleep % 11.2 3.0 8.3 2.3  1.4 0.0  0.4 0.0 0.0 0.0     Respiratory Indices      Total Events REM NREM All Night  pRDI:  182  pAHI:  180 ODI:  118  pAHIc:  0  % CSR: 0.0 60.7 59.4 51.0 0.0 19.2 19.2 8.5 0.0 29.7 29.4 19.3 0.0       Pulse Rate Statistics during Sleep (BPM)      Mean: 96 Minimum: 60 Maximum:  113    Indices  are calculated using technically valid sleep time of  6 hrs, 7 min. pRDI/pAHI are calculated using oxi desaturations ? 3%  Body Position Statistics  Position Supine Prone Right Left Non-Supine  Sleep (min) 326.5 0.0 6.0 35.2 41.2  Sleep % 88.8 0.0 1.6 9.6 11.2  pRDI 33.1 N/A N/A 3.4 2.9  pAHI 32.7 N/A N/A 3.4 2.9  ODI 21.5 N/A N/A 1.7 1.5     Snoring Statistics Snoring Level (dB) >40 >50 >60 >70 >80 >Threshold (45)  Sleep (min) 314.0 213.1 46.1 1.0 0.0 245.8  Sleep % 85.4 57.9 12.5 0.3 0.0 66.8    Mean: 51 dB Sleep Stages Chart                                             pAHI=29.4                                                          Mild              Moderate                    Severe                                                    5              15                    30

## 2019-06-16 DIAGNOSIS — G4733 Obstructive sleep apnea (adult) (pediatric): Secondary | ICD-10-CM | POA: Diagnosis not present

## 2019-07-01 DIAGNOSIS — M65322 Trigger finger, left index finger: Secondary | ICD-10-CM | POA: Diagnosis not present

## 2019-07-07 ENCOUNTER — Other Ambulatory Visit: Payer: Self-pay | Admitting: Pharmacist

## 2019-07-07 ENCOUNTER — Encounter: Payer: Self-pay | Admitting: Family Medicine

## 2019-07-07 MED ORDER — CANAGLIFLOZIN 300 MG PO TABS: 300.0000 mg | ORAL_TABLET | Freq: Every day | ORAL | 0 refills | Status: DC

## 2019-07-07 MED ORDER — HYDROCHLOROTHIAZIDE 25 MG PO TABS: 25.0000 mg | ORAL_TABLET | ORAL | 0 refills | Status: DC

## 2019-07-07 MED ORDER — METOPROLOL SUCCINATE ER 50 MG PO TB24: 50.0000 mg | ORAL_TABLET | Freq: Every day | ORAL | 0 refills | Status: DC

## 2019-07-07 MED ORDER — LIRAGLUTIDE 18 MG/3ML ~~LOC~~ SOPN: 1.8000 mg | PEN_INJECTOR | Freq: Every day | SUBCUTANEOUS | 0 refills | Status: DC

## 2019-07-07 MED ORDER — ROSUVASTATIN CALCIUM 5 MG PO TABS: 5.0000 mg | ORAL_TABLET | ORAL | 0 refills | Status: DC

## 2019-07-07 MED ORDER — AMLODIPINE BESYLATE 10 MG PO TABS: 10.0000 mg | ORAL_TABLET | Freq: Every day | ORAL | 0 refills | Status: DC

## 2019-07-08 ENCOUNTER — Ambulatory Visit: Payer: BLUE CROSS/BLUE SHIELD | Admitting: Family Medicine

## 2019-07-08 DIAGNOSIS — E785 Hyperlipidemia, unspecified: Secondary | ICD-10-CM | POA: Insufficient documentation

## 2019-07-08 DIAGNOSIS — G4730 Sleep apnea, unspecified: Secondary | ICD-10-CM | POA: Insufficient documentation

## 2019-07-11 ENCOUNTER — Telehealth: Payer: Self-pay

## 2019-07-11 NOTE — Telephone Encounter (Signed)
Called patient to do their pre-visit COVID screening.  Call went to voicemail. Unable to do prescreening.  

## 2019-07-12 ENCOUNTER — Other Ambulatory Visit: Payer: Self-pay

## 2019-07-12 ENCOUNTER — Encounter: Payer: Self-pay | Admitting: Internal Medicine

## 2019-07-12 ENCOUNTER — Ambulatory Visit (INDEPENDENT_AMBULATORY_CARE_PROVIDER_SITE_OTHER): Payer: BLUE CROSS/BLUE SHIELD | Admitting: Internal Medicine

## 2019-07-12 VITALS — BP 131/80 | HR 99 | Temp 97.3°F | Resp 17 | Ht 64.5 in | Wt 215.0 lb

## 2019-07-12 DIAGNOSIS — E039 Hypothyroidism, unspecified: Secondary | ICD-10-CM | POA: Diagnosis not present

## 2019-07-12 DIAGNOSIS — I10 Essential (primary) hypertension: Secondary | ICD-10-CM | POA: Diagnosis not present

## 2019-07-12 DIAGNOSIS — Z1239 Encounter for other screening for malignant neoplasm of breast: Secondary | ICD-10-CM

## 2019-07-12 DIAGNOSIS — E119 Type 2 diabetes mellitus without complications: Secondary | ICD-10-CM | POA: Diagnosis not present

## 2019-07-12 DIAGNOSIS — Z6836 Body mass index (BMI) 36.0-36.9, adult: Secondary | ICD-10-CM

## 2019-07-12 DIAGNOSIS — R0789 Other chest pain: Secondary | ICD-10-CM

## 2019-07-12 DIAGNOSIS — Z9989 Dependence on other enabling machines and devices: Secondary | ICD-10-CM

## 2019-07-12 DIAGNOSIS — E785 Hyperlipidemia, unspecified: Secondary | ICD-10-CM

## 2019-07-12 DIAGNOSIS — G4733 Obstructive sleep apnea (adult) (pediatric): Secondary | ICD-10-CM

## 2019-07-12 DIAGNOSIS — E1169 Type 2 diabetes mellitus with other specified complication: Secondary | ICD-10-CM

## 2019-07-12 MED ORDER — METOPROLOL SUCCINATE ER 50 MG PO TB24: 50.0000 mg | ORAL_TABLET | Freq: Every day | ORAL | 2 refills | Status: DC

## 2019-07-12 MED ORDER — ROSUVASTATIN CALCIUM 5 MG PO TABS: 5.0000 mg | ORAL_TABLET | ORAL | 2 refills | Status: DC

## 2019-07-12 MED ORDER — CANAGLIFLOZIN 300 MG PO TABS: 300.0000 mg | ORAL_TABLET | Freq: Every day | ORAL | 3 refills | Status: DC

## 2019-07-12 MED ORDER — HYDROCHLOROTHIAZIDE 25 MG PO TABS: 25.0000 mg | ORAL_TABLET | ORAL | 3 refills | Status: DC

## 2019-07-12 MED ORDER — POTASSIUM CHLORIDE CRYS ER 20 MEQ PO TBCR: 20.0000 meq | EXTENDED_RELEASE_TABLET | Freq: Every day | ORAL | 2 refills | Status: DC

## 2019-07-12 MED ORDER — AMLODIPINE BESYLATE 10 MG PO TABS: 10.0000 mg | ORAL_TABLET | Freq: Every day | ORAL | 3 refills | Status: DC

## 2019-07-12 MED ORDER — LIRAGLUTIDE 18 MG/3ML ~~LOC~~ SOPN: 1.8000 mg | PEN_INJECTOR | Freq: Every day | SUBCUTANEOUS | 3 refills | Status: DC

## 2019-07-12 NOTE — Progress Notes (Signed)
Patient ID: Melanie Steele, female    DOB: [birth year]  MRN: [id number]  CC: Diabetes, Hypertension, Hyperlipidemia, and Hypothyroidism   Subjective: Melanie Steele is a 58 y.o. female who presents for chronic disease management at [facility].  Her concerns today include:  History of HTN, OSA on CPAP, HL, DM, hypothyroidism, obesity   OSA:  Just got her CPAP machine about 1 mth ago.  Trying to get use to the mask.  She reports that she is noted decreased daytime sleepiness and is resting better since she started using the CPAP machine.     HYPERTENSION Currently taking: see medication list -HCTZ, metoprolol, amlodipine Med Adherence: [x]  Yes    []  No Medication side effects: []  Yes    [x]  No Adherence with salt restriction: [x]  Yes    []  No Home Monitoring?: []  Yes    [x]  No but does have a device Monitoring Frequency: []  Yes    []  No Home BP results range: []  Yes    []  No SOB? []  Yes    [x]  No Chest Pain?: [x]  Yes  - tightness LT upper chest occasionally about once every 3 wks.  Last 2-3 mins.  No radiation down arms or up neck.  Not associated with exertion.  Brother has CAD.    Leg swelling?: []  Yes    [x]  No Headaches?: []  Yes    [x]  No Dizziness? []  Yes    []  No Comments:   DM:  She has not been checking BS over past several wks. Meds: compliant with meds except for 1-2 days while her insurance requested her change from [facility] to [facility].  Needs 90 day supply on all meds Eating habits:  Eating more at home but "I snack too much."  Eating Gilato, potatoe chips for snacks Exercise:  Walks her dog about 1 block and walks around her yard  Thyroid:  On [place]our thyroid for 1-2 yrs.  Prior to this she was on Synthroid.  She is not sure why she was changed from Synthroid.  Never saw endocrinologist TSH level has not stablized on Armor thyroid No feeling of being hot or cols all the time   HM:  Has a Cologuard at home but has not use it as yet.  Due for MMG.  Eye exam cancel during  COVID Patient Active Problem List   Diagnosis Date Noted  . Hyperlipidemia 07/08/2019  . Sleep apnea 07/08/2019  . Essential hypertension 03/02/2019  . Type 2 diabetes mellitus without complication, without long-term current use of insulin ([place]) 03/02/2019  . Acquired hypothyroidism 03/02/2019     Current Outpatient Medications on File Prior to Visit  Medication Sig Dispense Refill  . Continuous Blood Gluc Sensor ([place]) MISC Apply one sensor every 14 days. 6 each 3  . EPINEPHrine 0.3 mg/0.3 mL IJ SOAJ injection epinephrine 0.3 mg/0.3 mL injection, auto-injector  Inject 0.3 mg IM as needed    . fexofenadine (ALLEGRA) 180 MG tablet Take 1 tablet by mouth every other day.     . gabapentin (NEURONTIN) 100 MG capsule Take 1 capsule (100 mg total) by mouth 3 (three) times daily as needed. 90 capsule 1  . Insulin Pen Needle (NOVOFINE) 30G X 8 MM MISC Use daily for Victoza injections 100 each 1  . thyroid (ARMOUR THYROID) 180 MG tablet Take 1 tablet (180 mg total) by mouth daily. 90 tablet 1   No current facility-administered medications on file prior to visit.  Allergies  Allergen Reactions  . Lisinopril Anaphylaxis  . Other Anaphylaxis    Tree nuts  . Linagliptin Other (See Comments)    Upset stomach  . Metformin And Related Diarrhea    Social History   Socioeconomic History  . Marital status: Widowed    Spouse name: Not on file  . Number of children: Not on file  . Years of education: Not on file  . Highest education level: Not on file  Occupational History  . Not on file  Social Needs  . Financial resource strain: Not on file  . Food insecurity    Worry: Not on file    Inability: Not on file  . Transportation needs    Medical: Not on file    Non-medical: Not on file  Tobacco Use  . Smoking status: Never Smoker  . Smokeless tobacco: Never Used  Substance and Sexual Activity  . Alcohol use: Yes  . Drug use: Not on file  . Sexual activity:  Not on file  Lifestyle  . Physical activity    Days per week: Not on file    Minutes per session: Not on file  . Stress: Not on file  Relationships  . Social Herbalist on phone: Not on file    Gets together: Not on file    Attends religious service: Not on file    Active member of club or organization: Not on file    Attends meetings of clubs or organizations: Not on file    Relationship status: Not on file  . Intimate partner violence    Fear of current or ex partner: Not on file    Emotionally abused: Not on file    Physically abused: Not on file    Forced sexual activity: Not on file  Other Topics Concern  . Not on file  Social History Narrative  . Not on file    Family History  Problem Relation Age of Onset  . Diabetes Mother   . Hypertension Mother   . Stroke Father   . Hypertension Sister     Past Surgical History:  Procedure Laterality Date  . ABDOMINAL HYSTERECTOMY    . TONSILLECTOMY    . TOOTH EXTRACTION    . TRIGGER FINGER RELEASE      ROS: Review of Systems Negative except as stated above  PHYSICAL EXAM: BP 131/80   Pulse 99   Temp (!) 97.3 F (36.3 C) (Temporal)   Resp 17   Ht 5' 4.5" (1.638 m)   Wt 215 lb (97.5 kg)   SpO2 96%   BMI 36.33 kg/m   Wt Readings from Last 3 Encounters:  07/12/19 215 lb (97.5 kg)  02/28/19 211 lb 12.8 oz (96.1 kg)   Physical Exam  General appearance - alert, well appearing, and in no distress Mental status - normal mood, behavior, speech, dress, motor activity, and thought processes Eyes -Pink conjunctiva Nose - normal and patent, no erythema, discharge or polyps Mouth - mucous membranes moist, pharynx normal without lesions Neck -thyroid slightly enlarged.  No palpable nodules Chest - clear to auscultation, no wheezes, rales or rhonchi, symmetric air entry Heart - normal rate, regular rhythm, normal S1, S2, no rubs, clicks or gallops.  Soft 1/6 SEM LUSB Extremities - peripheral pulses normal, no  pedal edema, no clubbing or cyanosis Diabetic Foot Exam - Simple   Simple Foot Form Visual Inspection No deformities, no ulcerations, no other skin breakdown bilaterally: Yes Sensation Testing  Intact to touch and monofilament testing bilaterally: Yes Pulse Check Posterior Tibialis and Dorsalis pulse intact bilaterally: Yes Comments     CMP Latest Ref Rng & Units 02/28/2019  Glucose 65 - 99 mg/dL 130(H)  BUN 6 - 24 mg/dL 11  Creatinine 0.57 - 1.00 mg/dL 0.70  Sodium 134 - 144 mmol/L 142  Potassium 3.5 - 5.2 mmol/L 3.5  Chloride 96 - 106 mmol/L 102  CO2 20 - 29 mmol/L 22  Calcium 8.7 - 10.2 mg/dL 10.2  Total Protein 6.0 - 8.5 g/dL 7.5  Total Bilirubin 0.0 - 1.2 mg/dL 0.8  Alkaline Phos 39 - 117 IU/L 79  AST 0 - 40 IU/L 16  ALT 0 - 32 IU/L 20   Lipid Panel  No results found for: CHOL, TRIG, HDL, CHOLHDL, VLDL, LDLCALC, LDLDIRECT  CBC    Component Value Date/Time   WBC 10.2 02/28/2019 1539   RBC 4.75 02/28/2019 1539   HGB 14.7 02/28/2019 1539   HCT 44.0 02/28/2019 1539   PLT 268 02/28/2019 1539   MCV 93 02/28/2019 1539   MCH 30.9 02/28/2019 1539   MCHC 33.4 02/28/2019 1539   RDW 12.4 02/28/2019 1539   LYMPHSABS 3.1 02/28/2019 1539   EOSABS 0.1 02/28/2019 1539   BASOSABS 0.1 02/28/2019 1539    ASSESSMENT AND PLAN: 1. Type 2 diabetes mellitus without complication, without long-term current use of insulin (HCC) Level of control is unknown but I suspect her A1c will probably be higher than it was in March given her reported eating habits. Dietary counseling given.  She tells me she has seen a nutritionist in the past and knows what she needs to do.  She will try purchasing healthier snacks and eating in moderation. Encouraged her to get in some moderate intensity exercise at least 3 to 4 days a week for 30 minutes Encouraged her to check blood sugars at least once a day.  I went over goal for blood sugar readings before meals and 2 hours after meals. - Hemoglobin A1c -  canagliflozin (INVOKANA) 300 MG TABS tablet; Take 1 tablet (300 mg total) by mouth daily.  Dispense: 90 tablet; Refill: 3 - liraglutide (VICTOZA) 18 MG/3ML SOPN; Inject 0.3 mLs (1.8 mg total) into the skin daily.  Dispense: 6 pen; Refill: 3 - Microalbumin / creatinine urine ratio - Ambulatory referral to Ophthalmology  2. Essential hypertension Close to goal.  Encourage her to check blood pressure at least once a week with goal being 130/80 or lower.  Continue to limit salt in the foods. - amLODipine (NORVASC) 10 MG tablet; Take 1 tablet (10 mg total) by mouth daily.  Dispense: 90 tablet; Refill: 3 - hydrochlorothiazide (HYDRODIURIL) 25 MG tablet; Take 1 tablet (25 mg total) by mouth every other day.  Dispense: 90 tablet; Refill: 3 - metoprolol succinate (TOPROL-XL) 50 MG 24 hr tablet; Take 1 tablet (50 mg total) by mouth daily.  Dispense: 90 tablet; Refill: 2 - potassium chloride SA (K-DUR) 20 MEQ tablet; Take 1 tablet (20 mEq total) by mouth daily.  Dispense: 90 tablet; Refill: 2  3. Acquired hypothyroidism Check TSH.  If we need to she is agreeable to changing to Synthroid or levothyroxine - TSH  4. OSA on CPAP Encourage her to continue compliance with using the CPAP machine.  She reports improvement in her sleep and decreased daytime sleepiness since using the machine  5. Atypical chest pain Advised to observe for now.  If this becomes more frequent or starts having chest pain  with exertion she needs to follow-up immediately  6. Class 2 severe obesity due to excess calories with serious comorbidity and body mass index (BMI) of 36.0 to 36.9 in adult [facility]) See #1 above  7. Hyperlipidemia associated with type 2 diabetes mellitus (HCC) - rosuvastatin (CRESTOR) 5 MG tablet; Take 1 tablet (5 mg total) by mouth every other day.  Dispense: 90 tablet; Refill: 2  8. Screening for breast cancer - MM Digital Screening; Future   Patient was given the opportunity to ask questions.  Patient  verbalized understanding of the plan and was able to repeat key elements of the plan.   Orders Placed This Encounter  Procedures  . MM Digital Screening  . Hemoglobin A1c  . Microalbumin / creatinine urine ratio  . TSH  . Ambulatory referral to Ophthalmology     Requested Prescriptions   Signed Prescriptions Disp Refills  . amLODipine (NORVASC) 10 MG tablet 90 tablet 3    Sig: Take 1 tablet (10 mg total) by mouth daily.  . canagliflozin (INVOKANA) 300 MG TABS tablet 90 tablet 3    Sig: Take 1 tablet (300 mg total) by mouth daily.  . hydrochlorothiazide (HYDRODIURIL) 25 MG tablet 90 tablet 3    Sig: Take 1 tablet (25 mg total) by mouth every other day.  . liraglutide (VICTOZA) 18 MG/3ML SOPN 6 pen 3    Sig: Inject 0.3 mLs (1.8 mg total) into the skin daily.  . metoprolol succinate (TOPROL-XL) 50 MG 24 hr tablet 90 tablet 2    Sig: Take 1 tablet (50 mg total) by mouth daily.  . potassium chloride SA (K-DUR) 20 MEQ tablet 90 tablet 2    Sig: Take 1 tablet (20 mEq total) by mouth daily.  . rosuvastatin (CRESTOR) 5 MG tablet 90 tablet 2    Sig: Take 1 tablet (5 mg total) by mouth every other day.    Return in about 3 months (around 10/12/2019).  Karle Plumber, MD, FACP

## 2019-07-12 NOTE — Progress Notes (Signed)
Insurance requires 90 day supplies for Rxs & patient must use [facility]. Patient has not been checking blood sugars.

## 2019-07-12 NOTE — Patient Instructions (Signed)
Try to check your blood sugars at least once a day before breakfast.  Goal is 90-130 before meals.  If you check after a meal, check it 2 hours after.  The goal is less than 180.   Try to walk 3-4 times a week for 30 minutes.  Please let me know if you start having any chest pain with exertion.    Continue using the CPAP machine nightly.  I have submitted a refer for you to see an ophthalmologist and for the mammogram.  Please use and mail of the Cologuard test that you have at home.

## 2019-07-13 ENCOUNTER — Other Ambulatory Visit: Payer: Self-pay | Admitting: Internal Medicine

## 2019-07-13 LAB — HEMOGLOBIN A1C
Est. average glucose Bld gHb Est-mCnc: 214 mg/dL
Hgb A1c MFr Bld: 9.1 % — ABNORMAL HIGH (ref 4.8–5.6)

## 2019-07-13 LAB — TSH: TSH: 0.56 u[IU]/mL (ref 0.450–4.500)

## 2019-07-14 LAB — MICROALBUMIN / CREATININE URINE RATIO
Creatinine, Urine: 33.9 mg/dL
Microalb/Creat Ratio: <9 mg/g creat (ref 0–29)
Microalbumin, Urine: <3 ug/mL

## 2019-07-15 ENCOUNTER — Encounter: Payer: Self-pay | Admitting: Neurology

## 2019-07-17 DIAGNOSIS — G4733 Obstructive sleep apnea (adult) (pediatric): Secondary | ICD-10-CM | POA: Diagnosis not present

## 2019-07-19 ENCOUNTER — Encounter (HOSPITAL_BASED_OUTPATIENT_CLINIC_OR_DEPARTMENT_OTHER): Payer: Self-pay

## 2019-07-19 ENCOUNTER — Ambulatory Visit (HOSPITAL_BASED_OUTPATIENT_CLINIC_OR_DEPARTMENT_OTHER)
Admission: RE | Admit: 2019-07-19 | Discharge: 2019-07-19 | Disposition: A | Discharge status: Home / Self Care | Payer: BLUE CROSS/BLUE SHIELD | Source: Ambulatory Visit | Attending: Internal Medicine | Admitting: Internal Medicine

## 2019-07-19 ENCOUNTER — Other Ambulatory Visit: Payer: Self-pay

## 2019-07-19 DIAGNOSIS — Z1231 Encounter for screening mammogram for malignant neoplasm of breast: Secondary | ICD-10-CM | POA: Insufficient documentation

## 2019-07-19 DIAGNOSIS — Z1239 Encounter for other screening for malignant neoplasm of breast: Secondary | ICD-10-CM

## 2019-08-17 DIAGNOSIS — G4733 Obstructive sleep apnea (adult) (pediatric): Secondary | ICD-10-CM | POA: Diagnosis not present

## 2019-08-28 ENCOUNTER — Encounter: Payer: Self-pay | Admitting: Neurology

## 2019-08-30 ENCOUNTER — Other Ambulatory Visit: Payer: Self-pay

## 2019-08-30 ENCOUNTER — Encounter: Payer: Self-pay | Admitting: Neurology

## 2019-08-30 ENCOUNTER — Ambulatory Visit (INDEPENDENT_AMBULATORY_CARE_PROVIDER_SITE_OTHER): Payer: BC Managed Care – PPO | Admitting: Neurology

## 2019-08-30 VITALS — BP 139/81 | HR 85 | Ht 65.0 in | Wt 215.0 lb

## 2019-08-30 DIAGNOSIS — Z789 Other specified health status: Secondary | ICD-10-CM | POA: Diagnosis not present

## 2019-08-30 DIAGNOSIS — Z9989 Dependence on other enabling machines and devices: Secondary | ICD-10-CM | POA: Diagnosis not present

## 2019-08-30 DIAGNOSIS — G4733 Obstructive sleep apnea (adult) (pediatric): Secondary | ICD-10-CM | POA: Diagnosis not present

## 2019-08-30 NOTE — Progress Notes (Signed)
Subjective:    Patient ID: Melanie Steele is a 58 y.o. female.  HPI     Interim history:   Melanie Steele is a 58 year old right-handed woman with an underlying medical history of vitamin D deficiency, diabetes, hypertension, hyperlipidemia, and obesity, who presents for follow-up consultation of her obstructive sleep apnea after interim home sleep testing and starting AutoPap therapy.  The patient is unaccompanied today.  I first met her on 04/12/2019 in virtual visit at the request of her primary care Melanie Steele, at which time she reported a prior diagnosis of obstructive sleep apnea but she had not been using her CPAP for about 2 years or so.  She was advised to proceed with testing.  She had a home sleep test on 06/01/2019 which indicated moderate obstructive sleep apnea with a near severe AHI of 29.4/h, O2 nadir of 81%.  She was advised to proceed with treatment at home with a new AutoPap machine.  Her set up date was 06/16/2019.    Today, 08/30/2019: I reviewed her AutoPap compliance data from the past 30 days from 07/30/2019 through 08/28/2019, during which time she used her machine 27 days with percent use days greater than 4 hours at 60%, indicating suboptimal compliance with an average usage of 4 hours and 36 minutes, residual AHI at goal at 2.3/h, average pressure for the 95th percentile at 12.7 cm with a range of 7cm to 13 cm, leak on the high side with a 95th percentile at 26.9 L/min.  Her compliance has declined over the past 75 days.  Her best compliance was in the beginning from 06/16/2019 through 07/15/2019, during which time her percent used time greater than 4 hours was 90%, indicating excellent compliance but leak was consistently higher.  She reports not always doing a great job with her sleep schedule.  She reports being somewhat of a night owl and has to get in a better routine.  Nevertheless, she has benefited from her AutoPap, she feels better rested, she no longer has any morning headaches  essentially.  She is more consolidated in her sleep and has better sleep quality.  She does not have to get up to use the bathroom generally speaking.  She does have some difficulty with the mask itself.  She is using a medium DreamWear full facemask and feels like it has to be tightened very much, she feels that she has puffy cheeks and the mask is not fitting quite right.  She was trying to email the DME provider but lost the card.  She is motivated to continue with treatment.   The patient's allergies, current medications, family history, past medical history, past social history, past surgical history and problem list were reviewed and updated as appropriate.    Previously:   04/12/2019: I am conducting a virtual, video based new patient visit via Webex in lieu of a face-to-face visit for evaluation of her sleep disorder, in particular, reevaluation of her prior diagnosis of obstructive sleep apnea. The patient is unaccompanied today and joins via laptop from home. I'm in my office. She is referred by Melanie Guild, Melanie Steele and I reviewed her office visit note from 10/07/2018.   She was diagnosed with obstructive sleep apnea over 10 years ago, and while she has been on CPAP therapy, she has not used her machine in the past couple of years she estimates. She had some difficulty obtaining supplies and following up on a regular basis she admits. She had stressors in her life and  recently lost her mother and grandmother within close proximity in time from each other and also moved from [place] to [place]. She has one son lives with her, age 14, her daughter is in [place], early 73s. Patient works for [employer], currently working from home because of virus pandemic. Her bedtime is generally around 11, rise time around 7. She has nocturia about once to up to 3 times per average night, she has had rare morning headaches. She has 1 small dog in the household, not in the bed with her at night. She has a TV in  the bedroom but turns it off at night. She is widowed, nonsmoker, drinks caffeine in limitation, about one cup per day and alcohol maybe once a week. She had a tonsillectomy in 2009 based on her sleep study showing severe sleep apnea. She also has a history of anaphylaxis. Her CPAP machine is the original machine, it is a ResMed S9.  Her Epworth sleepiness score is 6 out of 24, fatigue severity score is 33 out of 63.    Her Past Medical History Is Significant For: Past Medical History:  Diagnosis Date  . CPAP (continuous positive airway pressure) dependence   . Hyperlipidemia   . Hypertension   . Sleep apnea   . Type 2 diabetes mellitus ([place])   . Vitamin D deficiency     Her Past Surgical History Is Significant For: Past Surgical History:  Procedure Laterality Date  . ABDOMINAL HYSTERECTOMY    . TONSILLECTOMY    . TOOTH EXTRACTION    . TRIGGER FINGER RELEASE      Her Family History Is Significant For: Family History  Problem Relation Age of Onset  . Diabetes Mother   . Hypertension Mother   . Stroke Father   . Hypertension Sister     Her Social History Is Significant For: Social History   Socioeconomic History  . Marital status: Widowed    Spouse name: Not on file  . Number of children: Not on file  . Years of education: Not on file  . Highest education level: Not on file  Occupational History  . Not on file  Social Needs  . Financial resource strain: Not on file  . Food insecurity    Worry: Not on file    Inability: Not on file  . Transportation needs    Medical: Not on file    Non-medical: Not on file  Tobacco Use  . Smoking status: Never Smoker  . Smokeless tobacco: Never Used  Substance and Sexual Activity  . Alcohol use: Yes  . Drug use: Not on file  . Sexual activity: Not on file  Lifestyle  . Physical activity    Days per week: Not on file    Minutes per session: Not on file  . Stress: Not on file  Relationships  . Social Herbalist  on phone: Not on file    Gets together: Not on file    Attends religious service: Not on file    Active member of club or organization: Not on file    Attends meetings of clubs or organizations: Not on file    Relationship status: Not on file  Other Topics Concern  . Not on file  Social History Narrative  . Not on file    Her Allergies Are:  Allergies  Allergen Reactions  . Lisinopril Anaphylaxis  . Other Anaphylaxis    Tree nuts  . Linagliptin Other (See Comments)  Upset stomach  . Metformin And Related Diarrhea  :   Her Current Medications Are:  Outpatient Encounter Medications as of 08/30/2019  Medication Sig  . amLODipine (NORVASC) 10 MG tablet Take 1 tablet (10 mg total) by mouth daily.  . canagliflozin (INVOKANA) 300 MG TABS tablet Take 1 tablet (300 mg total) by mouth daily.  . Continuous Blood Gluc Sensor ([place]) MISC Apply one sensor every 14 days.  Marland Kitchen EPINEPHrine 0.3 mg/0.3 mL IJ SOAJ injection epinephrine 0.3 mg/0.3 mL injection, auto-injector  Inject 0.3 mg IM as needed  . fexofenadine (ALLEGRA) 180 MG tablet Take 1 tablet by mouth every other day.   . gabapentin (NEURONTIN) 100 MG capsule Take 1 capsule (100 mg total) by mouth 3 (three) times daily as needed.  . hydrochlorothiazide (HYDRODIURIL) 25 MG tablet Take 1 tablet (25 mg total) by mouth every other day.  . Insulin Pen Needle (NOVOFINE) 30G X 8 MM MISC Use daily for Victoza injections  . liraglutide (VICTOZA) 18 MG/3ML SOPN Inject 0.3 mLs (1.8 mg total) into the skin daily.  . metoprolol succinate (TOPROL-XL) 50 MG 24 hr tablet Take 1 tablet (50 mg total) by mouth daily.  . potassium chloride SA (K-DUR) 20 MEQ tablet Take 1 tablet (20 mEq total) by mouth daily.  . rosuvastatin (CRESTOR) 5 MG tablet Take 1 tablet (5 mg total) by mouth every other day.  . thyroid (ARMOUR THYROID) 180 MG tablet Take 1 tablet (180 mg total) by mouth daily.   No facility-administered encounter  medications on file as of 08/30/2019.   :  Review of Systems:  Out of a complete 14 point review of systems, all are reviewed and negative with the exception of these symptoms as listed below: Review of Systems  Neurological:       Pt presents today to discuss her cpap. Pt reports that she does feel more rested when she uses it. She reports that she needs to go to bed earlier; she is a "night owl."    Objective:  Neurological Exam  Physical Exam Physical Examination:   Vitals:   08/30/19 1034  BP: 139/81  Pulse: 85    General Examination: The patient is a very pleasant 58 y.o. female in no acute distress. She appears well-developed and well-nourished and well groomed.   HEENT: Normocephalic, atraumatic, pupils are equal, round and reactive to light. Corrective eyeglasses in place.  Extraocular tracking well preserved, hearing is grossly intact.  Face is symmetric with normal facial animation.  Speech is clear without dysarthria, hypophonia or voice tremor.  Neck shows full range of motion, no carotid bruits.  Airway examination reveals mild mouth dryness, good dental hygiene, mild airway crowding secondary to small airway, tonsils are absent.  Tongue protrudes centrally in palate elevates symmetrically.  Chest: Clear to auscultation without wheezing, rhonchi or crackles noted.  Heart: S1+S2+0, regular and normal without murmurs, rubs or gallops noted.   Abdomen: Soft, non-tender and non-distended with normal bowel sounds appreciated on auscultation.  Extremities: There is trace pitting edema in the distal lower extremities bilaterally, mostly around the ankles, R more than L.   Skin: Warm and dry without trophic changes noted.  Musculoskeletal: exam reveals no obvious joint deformities, tenderness or joint swelling or erythema.   Neurologically:  Mental status: The patient is awake, alert and oriented in all 4 spheres. Her immediate and remote memory, attention, language skills  and fund of knowledge are appropriate. There is no evidence of aphasia, agnosia,  apraxia or anomia. Speech is clear with normal prosody and enunciation. Thought process is linear. Mood is normal and affect is normal.  Cranial nerves II - XII are as described above under HEENT exam. In addition: shoulder shrug is normal with equal shoulder height noted. Motor exam: Normal bulk, strength and tone is noted. There is no tremor. Fine motor skills and coordination: grosslly intact.  Cerebellar testing: No dysmetria or intention tremor. Sensory exam: intact to light touch.  Gait, station and balance: no abn. Noted.   Assessment and Plan:  In summary, Melanie Steele is a very pleasant 58 y.o.-year old female with an underlying medical history of vitamin D deficiency, diabetes, hypertension, hyperlipidemia, and obesity, who presents for follow-up consultation of her obstructive sleep apnea after interim home sleep testing and starting AutoPap therapy.  Her home sleep test in June 2020 confirmed moderate obstructive sleep apnea.  She has been on AutoPap therapy.  Her compliance was overall Excellent when she first started her treatment with AutoPap in July 2020.  She has had some difficulty with the mask and air leakage.  Her air leak from the mask has been consistently high.  She would benefit from a mask refit.  I will request a new mask from her DME company you with a refit appointment hopefully.  She has noted benefit from treating her sleep apnea and that her sleep quality and daytime energy are better, also her morning headaches are improved and she does not have to get up at night to use the bathroom.  She is highly motivated to continue with treatment.  She would benefit from trying a different style of mask in my opinion.  She is advised to make an appointment with her DME provider.  She is advised to follow-up routinely in 6 months with a nurse practitioner.  I answered all her questions today and she was in  agreement. I spent 25 minutes in total face-to-face time with the patient, more than 50% of which was spent in counseling and coordination of care, reviewing test results, reviewing medication and discussing or reviewing the diagnosis of OSA, its prognosis and treatment options. Pertinent laboratory and imaging test results that were available during this visit with the patient were reviewed by me and considered in my medical decision making (see chart for details).

## 2019-08-30 NOTE — Patient Instructions (Signed)
Please continue using your autoPAP regularly. While your insurance requires that you use PAP at least 4 hours each night on 70% of the nights, I recommend, that you not skip any nights and use it throughout the night if you can. Getting used to PAP and staying with the treatment long term does take time and patience and discipline. Untreated obstructive sleep apnea when it is moderate to severe can have an adverse impact on cardiovascular health and raise her risk for heart disease, arrhythmias, hypertension, congestive heart failure, stroke and diabetes. Untreated obstructive sleep apnea causes sleep disruption, nonrestorative sleep, and sleep deprivation. This can have an impact on your day to day functioning and cause daytime sleepiness and impairment of cognitive function, memory loss, mood disturbance, and problems focussing. Using PAP regularly can improve these symptoms.  Keep up the good work! I will request a mask refit through [facility].   The address for [facility] is: [address] [phone].   We can see you in 6 months with the nurse practitioner and then hopefully yearly after that.

## 2019-08-30 NOTE — Progress Notes (Signed)
Order for mask refit sent to [facility] via community message. Confirmation received that the order transmitted was successful.  

## 2019-09-16 DIAGNOSIS — G4733 Obstructive sleep apnea (adult) (pediatric): Secondary | ICD-10-CM | POA: Diagnosis not present

## 2019-10-12 ENCOUNTER — Telehealth: Payer: Self-pay

## 2019-10-12 NOTE — Telephone Encounter (Signed)

## 2019-10-13 ENCOUNTER — Ambulatory Visit (INDEPENDENT_AMBULATORY_CARE_PROVIDER_SITE_OTHER): Payer: BC Managed Care – PPO | Admitting: Family Medicine

## 2019-10-13 ENCOUNTER — Encounter: Payer: Self-pay | Admitting: Family Medicine

## 2019-10-13 VITALS — BP 135/85 | HR 97 | Temp 97.2°F | Resp 17 | Wt 214.0 lb

## 2019-10-13 DIAGNOSIS — E785 Hyperlipidemia, unspecified: Secondary | ICD-10-CM

## 2019-10-13 DIAGNOSIS — M65312 Trigger thumb, left thumb: Secondary | ICD-10-CM

## 2019-10-13 DIAGNOSIS — M6530 Trigger finger, unspecified finger: Secondary | ICD-10-CM

## 2019-10-13 DIAGNOSIS — Z1159 Encounter for screening for other viral diseases: Secondary | ICD-10-CM | POA: Diagnosis not present

## 2019-10-13 DIAGNOSIS — I10 Essential (primary) hypertension: Secondary | ICD-10-CM

## 2019-10-13 DIAGNOSIS — E039 Hypothyroidism, unspecified: Secondary | ICD-10-CM | POA: Diagnosis not present

## 2019-10-13 DIAGNOSIS — E1169 Type 2 diabetes mellitus with other specified complication: Secondary | ICD-10-CM | POA: Diagnosis not present

## 2019-10-13 DIAGNOSIS — E119 Type 2 diabetes mellitus without complications: Secondary | ICD-10-CM | POA: Diagnosis not present

## 2019-10-13 DIAGNOSIS — E1149 Type 2 diabetes mellitus with other diabetic neurological complication: Secondary | ICD-10-CM

## 2019-10-13 DIAGNOSIS — M65322 Trigger finger, left index finger: Secondary | ICD-10-CM

## 2019-10-13 LAB — GLUCOSE, POCT (MANUAL RESULT ENTRY): POC Glucose: 169 mg/dl — AB (ref 70–99)

## 2019-10-13 MED ORDER — LIRAGLUTIDE 18 MG/3ML ~~LOC~~ SOPN: 1.8000 mg | PEN_INJECTOR | Freq: Every day | SUBCUTANEOUS | 3 refills | Status: DC

## 2019-10-13 MED ORDER — PREDNISONE 20 MG PO TABS: 20.0000 mg | ORAL_TABLET | Freq: Every day | ORAL | 0 refills | Status: DC

## 2019-10-13 MED ORDER — GABAPENTIN 100 MG PO CAPS: 100.0000 mg | ORAL_CAPSULE | Freq: Three times a day (TID) | ORAL | 1 refills | Status: DC | PRN

## 2019-10-13 NOTE — Progress Notes (Signed)
States that she hasn't been checking FSBS over the last 3 weeks. Did notice improvement in readings prior. Denies nausea, vomiting, polyuria, polydipsia.  Patient is fasting this morning but states that she had cake last night.  Had trigger finger release surgery on her L thumb & index finger. Now the other 3 fingers are starting to lock up. Concerned about possible arthritis.

## 2019-10-13 NOTE — Progress Notes (Signed)
 Established Patient Office Visit  Subjective:  Patient ID: Melanie Steele, female    DOB: 01/14/1961  Age: 58 y.o. MRN: [id number]  CC:  Chief Complaint  Patient presents with  . Diabetes  . Hypertension  . Hyperlipidemia  . Hypothyroidism    HPI Melanie Steele is a 58-year-old female with a history of hypertension, hypothyroidism, hyperlipidemia, type 2 diabetes mellitus (A1c 9.1) here for a follow-up visit. She complains of locking of the fingers of both hands and this is worse in the morning to the point that she sometimes she is unable to bend her hands.  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  She is status post left thumb and index finger surgery for trigger finger. Symptoms have worsened over the last 5 weeks.  With regards to her diabetes mellitus her A1c was 9.1 in 06/2019 and prior to that was 7.6 in 02/2019.  She is trying to eat right but has not been getting much exercise. Compliant with her antihypertensive and statin and has no adverse effects from her medications. With regards to healthcare maintenance she does not want colonoscopy because of bad experiences of some other close friends but would rather do the stool kit. She requests COVID-19 antibody testing.  Past Medical History:  Diagnosis Date  . CPAP (continuous positive airway pressure) dependence   . Hyperlipidemia   . Hypertension   . Sleep apnea   . Type 2 diabetes mellitus (HCC)   . Vitamin D deficiency     Past Surgical History:  Procedure Laterality Date  . ABDOMINAL HYSTERECTOMY    . TONSILLECTOMY    . TOOTH EXTRACTION    . TRIGGER FINGER RELEASE      Family History  Problem Relation Age of Onset  . Diabetes Mother   . Hypertension Mother   . Stroke Father   . Hypertension Sister     Social History   Socioeconomic History  . Marital status: Widowed    Spouse name: Not on file  . Number of children: Not on file  . Years of education: Not on file  . Highest education level: Not  on file  Occupational History  . Not on file  Social Needs  . Financial resource strain: Not on file  . Food insecurity    Worry: Not on file    Inability: Not on file  . Transportation needs    Medical: Not on file    Non-medical: Not on file  Tobacco Use  . Smoking status: Never Smoker  . Smokeless tobacco: Never Used  Substance and Sexual Activity  . Alcohol use: Yes  . Drug use: Not on file  . Sexual activity: Not on file  Lifestyle  . Physical activity    Days per week: Not on file    Minutes per session: Not on file  . Stress: Not on file  Relationships  . Social connections    Talks on phone: Not on file    Gets together: Not on file    Attends religious service: Not on file    Active member of club or organization: Not on file    Attends meetings of clubs or organizations: Not on file    Relationship status: Not on file  . Intimate partner violence    Fear of current or ex partner: Not on file    Emotionally abused: Not on file    Physically abused: Not on file    Forced sexual activity: Not on file    Other Topics Concern  . Not on file  Social History Narrative  . Not on file    Outpatient Medications Prior to Visit  Medication Sig Dispense Refill  . amLODipine (NORVASC) 10 MG tablet Take 1 tablet (10 mg total) by mouth daily. 90 tablet 3  . canagliflozin (INVOKANA) 300 MG TABS tablet Take 1 tablet (300 mg total) by mouth daily. 90 tablet 3  . EPINEPHrine 0.3 mg/0.3 mL IJ SOAJ injection epinephrine 0.3 mg/0.3 mL injection, auto-injector  Inject 0.3 mg IM as needed    . fexofenadine (ALLEGRA) 180 MG tablet Take 1 tablet by mouth every other day.     . gabapentin (NEURONTIN) 100 MG capsule Take 1 capsule (100 mg total) by mouth 3 (three) times daily as needed. 90 capsule 1  . hydrochlorothiazide (HYDRODIURIL) 25 MG tablet Take 1 tablet (25 mg total) by mouth every other day. 90 tablet 3  . Insulin Pen Needle (NOVOFINE) 30G X 8 MM MISC Use daily for Victoza  injections 100 each 1  . liraglutide (VICTOZA) 18 MG/3ML SOPN Inject 0.3 mLs (1.8 mg total) into the skin daily. 6 pen 3  . metoprolol succinate (TOPROL-XL) 50 MG 24 hr tablet Take 1 tablet (50 mg total) by mouth daily. 90 tablet 2  . potassium chloride SA (K-DUR) 20 MEQ tablet Take 1 tablet (20 mEq total) by mouth daily. 90 tablet 2  . rosuvastatin (CRESTOR) 5 MG tablet Take 1 tablet (5 mg total) by mouth every other day. 90 tablet 2  . thyroid (ARMOUR THYROID) 180 MG tablet Take 1 tablet (180 mg total) by mouth daily. 90 tablet 1  . Continuous Blood Gluc Sensor ([place]) MISC Apply one sensor every 14 days. (Patient not taking: Reported on 10/13/2019) 6 each 3   No facility-administered medications prior to visit.     Allergies  Allergen Reactions  . Lisinopril Anaphylaxis  . Other Anaphylaxis    Tree nuts  . Linagliptin Other (See Comments)    Upset stomach  . Metformin And Related Diarrhea    ROS Review of Systems  Constitutional: Negative for activity change, appetite change and fatigue.  HENT: Negative for congestion, sinus pressure and sore throat.   Eyes: Negative for visual disturbance.  Respiratory: Negative for cough, chest tightness, shortness of breath and wheezing.   Cardiovascular: Negative for chest pain and palpitations.  Gastrointestinal: Negative for abdominal distention, abdominal pain and constipation.  Endocrine: Negative for polydipsia.  Genitourinary: Negative for dysuria and frequency.  Musculoskeletal:       See hpi  Skin: Negative for rash.  Neurological: Negative for tremors, light-headedness and numbness.  Hematological: Does not bruise/bleed easily.  Psychiatric/Behavioral: Negative for agitation and behavioral problems.      Objective:    Physical Exam  Constitutional: She is oriented to person, place, and time. She appears well-developed and well-nourished. No distress.  HENT:  Head: Normocephalic.  Right Ear:  External ear normal.  Left Ear: External ear normal.  Nose: Nose normal.  Mouth/Throat: Oropharynx is clear and moist.  Eyes: Pupils are equal, round, and reactive to light. Conjunctivae and EOM are normal.  Neck: Normal range of motion. No JVD present.  Cardiovascular: Normal rate, regular rhythm, normal heart sounds and intact distal pulses. Exam reveals no gallop.  No murmur heard. Pulmonary/Chest: Effort normal and breath sounds normal. No respiratory distress. She has no wheezes. She has no rales. She exhibits no tenderness.  Abdominal: Soft. Bowel sounds are normal. She exhibits no  distension and no mass. There is no abdominal tenderness.  Musculoskeletal: Normal range of motion.        General: No tenderness or edema.     Comments: Able to make a fist in both hands  Neurological: She is alert and oriented to person, place, and time. She has normal reflexes.  Skin: Skin is warm and dry. She is not diaphoretic.  Psychiatric: She has a normal mood and affect.    BP 135/85   Pulse 97   Temp (!) 97.2 F (36.2 C) (Temporal)   Resp 17   Wt 214 lb (97.1 kg)   SpO2 96%   BMI 35.61 kg/m  Wt Readings from Last 3 Encounters:  10/13/19 214 lb (97.1 kg)  08/30/19 215 lb (97.5 kg)  07/12/19 215 lb (97.5 kg)     Health Maintenance Due  Topic Date Due  . HIV Screening  01/07/1977  . COLONOSCOPY  01/08/2012  . OPHTHALMOLOGY EXAM  06/15/2018  . FOOT EXAM  10/08/2019    There are no preventive care reminders to display for this patient.  Lab Results  Component Value Date   TSH 0.560 07/12/2019   Lab Results  Component Value Date   WBC 10.2 02/28/2019   HGB 14.7 02/28/2019   HCT 44.0 02/28/2019   MCV 93 02/28/2019   PLT 268 02/28/2019   Lab Results  Component Value Date   NA 142 02/28/2019   K 3.5 02/28/2019   CO2 22 02/28/2019   GLUCOSE 130 (H) 02/28/2019   BUN 11 02/28/2019   CREATININE 0.70 02/28/2019   BILITOT 0.8 02/28/2019   ALKPHOS 79 02/28/2019   AST 16  02/28/2019   ALT 20 02/28/2019   PROT 7.5 02/28/2019   ALBUMIN 4.3 02/28/2019   CALCIUM 10.2 02/28/2019    Lab Results  Component Value Date   HGBA1C 9.1 (H) 07/12/2019      Assessment & Plan:   1. Type 2 diabetes mellitus with other neurologic complication, without long-term current use of insulin (HCC) Uncontrolled with A1c of 9.1 We will send of A1c and adjust regimen accordingly Counseled on Diabetic diet, my plate method, 150 minutes of moderate intensity exercise/week Blood sugar logs with fasting goals of 80-120 mg/dl, random of less than 180 and in the event of sugars less than 60 mg/dl or greater than 400 mg/dl encouraged to notify the clinic. Advised on the need for annual eye exams, annual foot exams, Pneumonia vaccine. - Glucose (CBG) - Hemoglobin A1c - gabapentin (NEURONTIN) 100 MG capsule; Take 1 capsule (100 mg total) by mouth 3 (three) times daily as needed.  Dispense: 90 capsule; Refill: 1 - liraglutide (VICTOZA) 18 MG/3ML SOPN; Inject 0.3 mLs (1.8 mg total) into the skin daily.  Dispense: 6 pen; Refill: 3  2. Essential hypertension Controlled Counseled on blood pressure goal of less than 130/80, low-sodium, DASH diet, medication compliance, 150 minutes of moderate intensity exercise per week. Discussed medication compliance, adverse effects. Continue current antihypertensive  3. Hyperlipidemia associated with type 2 diabetes mellitus (HCC) We will check lipid panel and will adjust regimen accordingly - Lipid panel  4. Acquired hypothyroidism Controlled We will send of thyroid panel and adjust regimen accordingly - TSH - T4, free  5. Trigger finger, acquired Uncontrolled with previous trigger finger surgeries She is hesitant to remaining on an additional chronic medication hence I will hold off on NSAID and she has been advised to use it as needed - predniSONE (DELTASONE) 20 MG tablet; Take 1   tablet (20 mg total) by mouth daily with breakfast.  Dispense:  5 tablet; Refill: 0 - Ambulatory referral to Orthopedic Surgery  6. Screening for viral disease - HIV antibody (with reflex) - SAR CoV2 Serology (COVID 19)AB(IGG)IA - SARS-CoV-2 Antibodies  Return in about 3 months (around 01/13/2020) for chronic medical conditions.    Enobong Newlin, MD 

## 2019-10-13 NOTE — Patient Instructions (Signed)
Diclofenac skin gel What is this medicine? DICLOFENAC (dye KLOE fen ak) is a non-steroidal anti-inflammatory drug (NSAID). The 1% skin gel is used to treat osteoarthritis of the hands or knees. The 3% skin gel is used to treat actinic keratosis. This medicine may be used for other purposes; ask your health care provider or pharmacist if you have questions. COMMON BRAND NAME(S): DSG Sherrye Payor, Solaravix, Solaraze, Voltaren Gel What should I tell my health care provider before I take this medicine? They need to know if you have any of these conditions:  asthma  bleeding problems  coronary artery bypass graft (CABG) surgery within the past 2 weeks  heart disease  high blood pressure  if you frequently drink alcohol containing drinks  kidney disease  liver disease  open or infected skin  stomach problems  an unusual or allergic reaction to diclofenac, aspirin, other NSAIDs, other medicines, benzyl alcohol (3% gel only), foods, dyes, or preservatives  pregnant or trying to get pregnant  breast-feeding How should I use this medicine? This medicine is for external use only. Follow the directions on the prescription label. Wash hands before and after use. Do not get this medicine in your eyes. If you do, rinse out with plenty of cool tap water. Use your doses at regular intervals. Do not use your medicine more often than directed. A special MedGuide will be given to you by the pharmacist with each prescription and refill of the 1% gel. Be sure to read this information carefully each time. Talk to your pediatrician regarding the use of this medicine in children. Special care may be needed. The 3% gel is not approved for use in children. Overdosage: If you think you have taken too much of this medicine contact a poison control center or emergency room at once. NOTE: This medicine is only for you. Do not share this medicine with others. What if I miss a dose? If you miss a dose, use it  as soon as you can. If it is almost time for your next dose, use only that dose. Do not use double or extra doses. What may interact with this medicine?  aspirin  NSAIDs, medicines for pain and inflammation, like ibuprofen or naproxen Do not use any other skin products without telling your doctor or health care professional. This list may not describe all possible interactions. Give your health care provider a list of all the medicines, herbs, non-prescription drugs, or dietary supplements you use. Also tell them if you smoke, drink alcohol, or use illegal drugs. Some items may interact with your medicine. What should I watch for while using this medicine? Tell your doctor or healthcare provider if your symptoms do not start to get better or if they get worse. You will need to follow up with your healthcare provider to monitor your progress. You may need to be treated for up to 3 months if you are using the 3% gel, but the full effect may not occur until 1 month after stopping treatment. If you develop a severe skin reaction, contact your doctor or healthcare provider immediately. This medicine may cause serious skin reactions. They can happen weeks to months after starting the medicine. Contact your healthcare provider right away if you notice fevers or flu-like symptoms with a rash. The rash may be red or purple and then turn into blisters or peeling of the skin. Or, you might notice a red rash with swelling of the face, lips or lymph nodes in your neck  or under your arms. This medicine can make you more sensitive to the sun. Keep out of the sun. If you cannot avoid being in the sun, wear protective clothing and use sunscreen. Do not use sun lamps or tanning beds/booths. Do not take medicines such as ibuprofen and naproxen with this medicine. Side effects such as stomach upset, nausea, or ulcers may be more likely to occur. Many medicines available without a prescription should not be taken with this  medicine. This medicine does not prevent heart attack or stroke. In fact, this medicine may increase the chance of a heart attack or stroke. The chance may increase with longer use of this medicine and in people who have heart disease. If you take aspirin to prevent heart attack or stroke, talk with your doctor or healthcare provider. This medicine can cause ulcers and bleeding in the stomach and intestines at any time during treatment. Do not smoke cigarettes or drink alcohol. These increase irritation to your stomach and can make it more susceptible to damage from this medicine. Ulcers and bleeding can happen without warning symptoms and can cause death. You may get drowsy or dizzy. Do not drive, use machinery, or do anything that needs mental alertness until you know how this medicine affects you. Do not stand or sit up quickly, especially if you are an older patient. This reduces the risk of dizzy or fainting spells. This medicine can cause you to bleed more easily. Try to avoid damage to your teeth and gums when you brush or floss your teeth. What side effects may I notice from receiving this medicine? Side effects that you should report to your doctor or health care professional as soon as possible:  allergic reactions like skin rash, itching or hives, swelling of the face, lips, or tongue  black or bloody stools, blood in the urine or vomit  blurred vision  chest pain  difficulty breathing or wheezing  nausea or vomiting  rash, fever, and swollen lymph nodes  redness, blistering, peeling or loosening of the skin, including inside the mouth  slurred speech or weakness on one side of the body  trouble passing urine or change in the amount of urine  unexplained weight gain or swelling  unusually weak or tired  yellowing of eyes or skin Side effects that usually do not require medical attention (report to your doctor or health care professional if they continue or are  bothersome):  dizziness  dry skin  headache  heartburn  increased sensitivity to the sun  stomach pain  tingling at the application site This list may not describe all possible side effects. Call your doctor for medical advice about side effects. You may report side effects to FDA at 1-800-FDA-1088. Where should I keep my medicine? Keep out of the reach of children. Store the 1% gel at room temperature between 15 and 30 degrees C (59 and 86 degrees F). Store the 3% gel at room temperature between 20 and 25 degrees C (68 and 77 degrees F). Protect from light. Throw away any unused medicine after the expiration date. NOTE: This sheet is a summary. It may not cover all possible information. If you have questions about this medicine, talk to your doctor, pharmacist, or health care provider.  2020 Elsevier/Gold Standard (2019-02-16 13:05:18)

## 2019-10-14 LAB — HEMOGLOBIN A1C
Est. average glucose Bld gHb Est-mCnc: 194 mg/dL
Hgb A1c MFr Bld: 8.4 % — ABNORMAL HIGH (ref 4.8–5.6)

## 2019-10-14 LAB — LIPID PANEL
Chol/HDL Ratio: 3.3 ratio (ref 0.0–4.4)
Cholesterol, Total: 187 mg/dL (ref 100–199)
HDL: 56 mg/dL (ref >39)
LDL Chol Calc (NIH): 111 mg/dL — ABNORMAL HIGH (ref 0–99)
Triglycerides: 109 mg/dL (ref 0–149)
VLDL Cholesterol Cal: 20 mg/dL (ref 5–40)

## 2019-10-14 LAB — HIV ANTIBODY (ROUTINE TESTING W REFLEX): HIV Screen 4th Generation wRfx: NONREACTIVE

## 2019-10-14 LAB — SAR COV2 SEROLOGY (COVID19)AB(IGG),IA

## 2019-10-14 LAB — T4, FREE: Free T4: 1.43 ng/dL (ref 0.82–1.77)

## 2019-10-14 LAB — SARS-COV-2 ANTIBODIES: SARS-CoV-2 Antibodies: NEGATIVE

## 2019-10-14 LAB — EUROIMMUN SARS-COV-2 AB, IGG: Euroimmun SARS-CoV-2 Ab, IgG: NEGATIVE

## 2019-10-14 LAB — TSH: TSH: 0.116 u[IU]/mL — ABNORMAL LOW (ref 0.450–4.500)

## 2019-10-17 ENCOUNTER — Other Ambulatory Visit: Payer: Self-pay | Admitting: Family Medicine

## 2019-10-17 MED ORDER — GLIPIZIDE 5 MG PO TABS: 5.0000 mg | ORAL_TABLET | Freq: Two times a day (BID) | ORAL | 3 refills | Status: DC

## 2019-11-18 DIAGNOSIS — G4733 Obstructive sleep apnea (adult) (pediatric): Secondary | ICD-10-CM | POA: Diagnosis not present

## 2020-01-12 ENCOUNTER — Ambulatory Visit: Payer: BC Managed Care – PPO

## 2020-01-30 ENCOUNTER — Ambulatory Visit (INDEPENDENT_AMBULATORY_CARE_PROVIDER_SITE_OTHER): Payer: BC Managed Care – PPO | Admitting: Internal Medicine

## 2020-01-30 ENCOUNTER — Encounter: Payer: Self-pay | Admitting: Internal Medicine

## 2020-01-30 DIAGNOSIS — M6530 Trigger finger, unspecified finger: Secondary | ICD-10-CM

## 2020-01-30 DIAGNOSIS — R21 Rash and other nonspecific skin eruption: Secondary | ICD-10-CM

## 2020-01-30 DIAGNOSIS — L659 Nonscarring hair loss, unspecified: Secondary | ICD-10-CM | POA: Diagnosis not present

## 2020-01-30 NOTE — Progress Notes (Signed)
Virtual Visit via Telephone Note  I connected with Melanie Steele, on 01/30/2020 at 10:28 AM by telephone due to the COVID-19 pandemic and verified that I am speaking with the correct person using two identifiers.   Consent: I discussed the limitations, risks, security and privacy concerns of performing an evaluation and management service by telephone and the availability of in person appointments. I also discussed with the patient that there may be a patient responsible charge related to this service. The patient expressed understanding and agreed to proceed.   Location of Patient: Home   Location of Provider: Clinic    Persons participating in Telemedicine visit: [facility] Dr. Juleen China      History of Present Illness: Patient has a visit for follow up of chronic medical conditions. Patient had a visit in October with A1c of 8.4%. She was prescribed Glipizide at that time but she has not been taking it because she didn't feel like she had a good explanation of why this was prescribed.   Has concerns about her hands. She reports that she has had trigger finger surgery and she complains of locking of hands bilaterally. A referral was placed to orthopedic surgery at the previous office visit. Patient declined the appointment and said she would like to see hand surgery. She is worried about long term consequences and losing function in her hands. She does take anti inflammatory about 3 nights per week.   Requests referral for dermatology. Wants to see someone who is more familiar with melanin skin. She is having a lot of issues. Her face is breaking out a lot on one side especially with use of face mask and CPAP machine. She is also having some hair loss at the temple region.    Past Medical History:  Diagnosis Date  . CPAP (continuous positive airway pressure) dependence   . Hyperlipidemia   . Hypertension   . Sleep apnea   . Type 2 diabetes mellitus ([place])   .  Vitamin D deficiency    Allergies  Allergen Reactions  . Lisinopril Anaphylaxis  . Other Anaphylaxis    Tree nuts  . Linagliptin Other (See Comments)    Upset stomach  . Metformin And Related Diarrhea    Current Outpatient Medications on File Prior to Visit  Medication Sig Dispense Refill  . amLODipine (NORVASC) 10 MG tablet Take 1 tablet (10 mg total) by mouth daily. 90 tablet 3  . canagliflozin (INVOKANA) 300 MG TABS tablet Take 1 tablet (300 mg total) by mouth daily. 90 tablet 3  . Continuous Blood Gluc Sensor ([place]) MISC Apply one sensor every 14 days. 6 each 3  . EPINEPHrine 0.3 mg/0.3 mL IJ SOAJ injection epinephrine 0.3 mg/0.3 mL injection, auto-injector  Inject 0.3 mg IM as needed    . fexofenadine (ALLEGRA) 180 MG tablet Take 1 tablet by mouth every other day.     . gabapentin (NEURONTIN) 100 MG capsule Take 1 capsule (100 mg total) by mouth 3 (three) times daily as needed. 90 capsule 1  . hydrochlorothiazide (HYDRODIURIL) 25 MG tablet Take 1 tablet (25 mg total) by mouth every other day. 90 tablet 3  . Insulin Pen Needle (NOVOFINE) 30G X 8 MM MISC Use daily for Victoza injections 100 each 1  . liraglutide (VICTOZA) 18 MG/3ML SOPN Inject 0.3 mLs (1.8 mg total) into the skin daily. 6 pen 3  . metoprolol succinate (TOPROL-XL) 50 MG 24 hr tablet Take 1 tablet (50 mg total)  by mouth daily. 90 tablet 2  . potassium chloride SA (K-DUR) 20 MEQ tablet Take 1 tablet (20 mEq total) by mouth daily. 90 tablet 2  . rosuvastatin (CRESTOR) 5 MG tablet Take 1 tablet (5 mg total) by mouth every other day. 90 tablet 2  . thyroid (ARMOUR THYROID) 180 MG tablet Take 1 tablet (180 mg total) by mouth daily. 90 tablet 1   No current facility-administered medications on file prior to visit.    Observations/Objective: NAD. Speaking clearly.  Work of breathing normal.  Alert and oriented. Mood appropriate.   Assessment and Plan: 1. Trigger finger, acquired Uncontrolled  with previous trigger finger surgeries. Patient wants other methods of dealing with this as opposed to chronic NSAID use. Discussed epsom salt, heat, ice trials. I think PT/OT may be beneficial. Will also refer to hand surgery for consult.  - Ambulatory referral to Hand Surgery - Ambulatory referral to Physical Therapy - Ambulatory referral to Occupational Therapy  2. Rash and nonspecific skin eruption - Ambulatory referral to Dermatology  3. Hair loss - Ambulatory referral to Dermatology    Follow Up Instructions: Follow up with specialists; follow up for chronic medical conditions. Patient prefers to have labs drawn prior to visits. Discussed that this is feasible and we can do this in the future.    I discussed the assessment and treatment plan with the patient. The patient was provided an opportunity to ask questions and all were answered. The patient agreed with the plan and demonstrated an understanding of the instructions.   The patient was advised to call back or seek an in-person evaluation if the symptoms worsen or if the condition fails to improve as anticipated.     I provided 14 minutes total of non-face-to-face time during this encounter including median intraservice time, reviewing previous notes, investigations, ordering medications, medical decision making, coordinating care and patient verbalized understanding at the end of the visit.    Phill Myron, D.O. Primary [facility] at [facility]  01/30/2020, 10:28 AM

## 2020-02-10 ENCOUNTER — Other Ambulatory Visit: Payer: Self-pay | Admitting: Internal Medicine

## 2020-02-10 ENCOUNTER — Telehealth: Payer: Self-pay

## 2020-02-10 MED ORDER — TRULICITY 0.75 MG/0.5ML ~~LOC~~ SOAJ: 0.7500 mg | SUBCUTANEOUS | 3 refills | Status: DC

## 2020-02-10 NOTE — Telephone Encounter (Signed)
Victoza is non-preferred on pt's ins.  If appropriate would you please change therapy to a preferred product-BYDUREON, BYETTA, OZEMPIC, OR TRULICITY and send script to [place] on [address].

## 2020-02-10 NOTE — Progress Notes (Signed)
Trulicity sent to pharmacy in place of Victoza as Victoza was not on preferred list for patient's insurance.   Phill Myron, D.O. Primary Care at [facility]  02/10/2020, 11:49 PM

## 2020-02-22 NOTE — Addendum Note (Signed)
Addended by: Melina Schools on: 02/22/2020 11:15 AM   Modules accepted: Orders

## 2020-02-23 DIAGNOSIS — G4733 Obstructive sleep apnea (adult) (pediatric): Secondary | ICD-10-CM | POA: Diagnosis not present

## 2020-02-27 ENCOUNTER — Ambulatory Visit: Payer: BC Managed Care – PPO | Admitting: Adult Health

## 2020-03-19 MED ORDER — FREESTYLE LIBRE SENSOR SYSTEM MISC: 0 refills | Status: DC

## 2020-04-03 ENCOUNTER — Other Ambulatory Visit: Payer: Self-pay

## 2020-04-03 ENCOUNTER — Ambulatory Visit: Payer: BC Managed Care – PPO | Attending: Internal Medicine | Admitting: Occupational Therapy

## 2020-04-03 ENCOUNTER — Encounter: Payer: Self-pay | Admitting: Occupational Therapy

## 2020-04-03 DIAGNOSIS — M79641 Pain in right hand: Secondary | ICD-10-CM | POA: Insufficient documentation

## 2020-04-03 DIAGNOSIS — M6281 Muscle weakness (generalized): Secondary | ICD-10-CM | POA: Diagnosis not present

## 2020-04-03 DIAGNOSIS — M25641 Stiffness of right hand, not elsewhere classified: Secondary | ICD-10-CM | POA: Diagnosis not present

## 2020-04-03 DIAGNOSIS — R600 Localized edema: Secondary | ICD-10-CM | POA: Diagnosis not present

## 2020-04-03 DIAGNOSIS — M79631 Pain in right forearm: Secondary | ICD-10-CM | POA: Diagnosis not present

## 2020-04-03 NOTE — Therapy (Signed)
[facility] [address] [place], [place], [address] Phone: [phone]   Fax:  [phone]  Occupational Therapy Evaluation  Patient Details  Name: Melanie Steele MRN: [id number] Date of Birth: 08/10/1961 Referring Provider (OT): Phill Myron   Encounter Date: 04/03/2020  OT End of Session - 04/03/20 1810    Visit Number  1    Number of Visits  11    Date for OT Re-Evaluation  06/02/20    Authorization Type  [employer]- 60 VL - OT/PT/SLP    OT Start Time  1700    OT Stop Time  1800    OT Time Calculation (min)  60 min    Behavior During Therapy  [facility] for tasks assessed/performed       Past Medical History:  Diagnosis Date  . CPAP (continuous positive airway pressure) dependence   . Hyperlipidemia   . Hypertension   . Sleep apnea   . Type 2 diabetes mellitus ([place])   . Vitamin D deficiency     Past Surgical History:  Procedure Laterality Date  . ABDOMINAL HYSTERECTOMY    . TONSILLECTOMY    . TOOTH EXTRACTION    . TRIGGER FINGER RELEASE      There were no vitals filed for this visit.  Subjective Assessment - 04/03/20 1706    Subjective   I want to figure out what this is, and figure out steps to make this better.    Currently in Pain?  Yes    Pain Score  3     Pain Location  Finger (Comment which one)    Pain Orientation  Right    Pain Descriptors / Indicators  Throbbing    Pain Type  Chronic pain    Pain Radiating Towards  Dorsal aspect of forearm and elbow    Pain Onset  More than a month ago    Pain Frequency  Constant    Aggravating Factors   stiffness in am, fingers locked    Pain Relieving Factors  aleve, ice    Effect of Pain on Daily Activities  gardening, opening jars, carrying items, writing        [facility] OT Assessment - 04/03/20 0001      Assessment   Medical Diagnosis  trigger finger    Referring Provider (OT)  Phill Myron    Onset Date/Surgical Date  01/30/20    Hand  Dominance  Right    Prior Therapy  Has had prior trigger finger surgeries      Prior Function   Level of Independence  Independent with basic ADLs    Vocation  Full time employment    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, phone    Leisure  gardening      Written Expression   Dominant Hand  Right    Handwriting  100% legible   ring finger maintains extension     Sensation   Light Touch  Appears Intact      Coordination   9 Hole Peg Test  Right;Left    Right 9 Hole Peg Test  20.68    Left 9 Hole Peg Test  21.10      ROM / Strength   AROM / PROM / Strength  AROM;Strength      AROM   Overall AROM   Within functional limits for tasks performed    Overall AROM Comments  Shoulders, elbows, forearms, wrists - [facility]  Strength   Overall Strength  Within functional limits for tasks performed    Overall Strength Comments  shoulders, elbows, wrists- WFL      Right Hand AROM   R Ring PIP 0-100  --   Unable to contact finger pad to palm - limited MCP/PIP flex     Right Hand PROM   R Ring PIP 0-100  --   limited ability to passively fist hand due to pain     Hand Function   Right Hand Gross Grasp  Impaired    Right Hand Grip (lbs)  24    Right Hand Lateral Pinch  18 lbs    Left Hand Gross Grasp  Functional    Left Hand Grip (lbs)  45    Left Hand Lateral Pinch  15 lbs                      OT Education - 04/03/20 1809    Education Details  discussed anti-inflammatory benefits, iontophoresis, benefits of seeing a hand specialist - resources given, avoid repetitive gripping/ rest to reduce symptoms    Person(s) Educated  Patient    Methods  Explanation    Comprehension  Need further instruction       OT Short Term Goals - 04/03/20 1822      OT SHORT TERM GOAL #1   Title  Patient will report pain no greater than 3/10 when completing HEP for right hand due 05/03/20    Time  4    Period  Weeks    Status  New    Target Date   05/03/20      OT SHORT TERM GOAL #2   Title  Patient will demonstrate awareness of activity modifications to reduce symptoms in right hand/arm during work, and leisure activities    Time  4    Period  Weeks    Status  New      OT SHORT TERM GOAL #3   Title  Patient will demonstrate composite fist in right hand (pad to palm) with all four fingers actively to help with functional grasp    Time  4    Period  Weeks    Status  New      OT SHORT TERM GOAL #4   Title  Patient will demonstrate awareness of splint wear and care    Time  4    Period  Weeks    Status  New        OT Long Term Goals - 04/03/20 1824      OT LONG TERM GOAL #1   Title  Patient will complete updated HEP to address both range of motion and strengthening in right hand    Time  6    Period  Weeks    Status  New    Target Date  06/02/20      OT LONG TERM GOAL #2   Title  Patient will report pain no greater than 2/10 when functionally using right hand to carry a mug or glass of liquid 15 feet with right hand    Time  6    Period  Weeks    Status  New      OT LONG TERM GOAL #3   Title  Patient will report effective grip on garden tools, trowel, etc when working in her raised beds (vegetable garden)    Time  6    Period  Weeks    Status  New      OT LONG TERM GOAL #4   Title  Patient will demonstrate at least 7 lb increase in grip strength in right hand to help with ADL/IADL/ leisure activity    Baseline  24lb    Time  6    Period  Weeks    Status  New            Plan - 04/03/20 1817    Clinical Impression Statement  Patient is a 59 year old woman referred to OT with trigger finger (conservative management)  Patient limited by pain, decreased range of motion and strength in right hand, radiating up her arm to the lateral aspect of her elbow.  Patient will benefit form skilled OT intervention to improve functional use of RUE.    OT Occupational Profile and History  Problem Focused Assessment -  Including review of records relating to presenting problem    Occupational performance deficits (Please refer to evaluation for details):  ADL's;IADL's;Leisure;Rest and Sleep;Work    Marketing executive / Function / Physical Skills  ADL;UE functional use;Fascial restriction;Body mechanics;Decreased knowledge of use of DME;Flexibility;IADL;Pain;Strength;Dexterity;Edema;ROM    Rehab Potential  Good    Clinical Decision Making  Limited treatment options, no task modification necessary    Comorbidities Affecting Occupational Performance:  Presence of comorbidities impacting occupational performance    Comorbidities impacting occupational performance description:  Prior trigger thumb/finger release, DM    Modification or Assistance to Complete Evaluation   No modification of tasks or assist necessary to complete eval    OT Frequency  2x / week    OT Duration  4 weeks   Then 1x/week for 2 weeks   OT Treatment/Interventions  Self-care/ADL training;Electrical Stimulation;Iontophoresis;Therapeutic exercise;Patient/family education;Splinting;Moist Heat;Fluidtherapy;Therapeutic activities;Passive range of motion;Manual Therapy;DME and/or AE instruction;Contrast Bath;Ultrasound;Cryotherapy    Plan  check for ionto orders on cert, start HEP stretching program, may be best to start with fluido if pain    Consulted and Agree with Plan of Care  Patient       Patient will benefit from skilled therapeutic intervention in order to improve the following deficits and impairments:   Body Structure / Function / Physical Skills: ADL, UE functional use, Fascial restriction, Body mechanics, Decreased knowledge of use of DME, Flexibility, IADL, Pain, Strength, Dexterity, Edema, ROM       Visit Diagnosis: Pain in right hand - Plan: Ot plan of care cert/re-cert  Pain in right forearm - Plan: Ot plan of care cert/re-cert  Stiffness of right hand, not elsewhere classified - Plan: Ot plan of care cert/re-cert  Muscle  weakness (generalized) - Plan: Ot plan of care cert/re-cert  Localized edema - Plan: Ot plan of care cert/re-cert    Problem List Patient Active Problem List   Diagnosis Date Noted  . Hyperlipidemia 07/08/2019  . Sleep apnea 07/08/2019  . Essential hypertension 03/02/2019  . Type 2 diabetes mellitus without complication, without long-term current use of insulin ([place]) 03/02/2019  . Acquired hypothyroidism 03/02/2019    Mariah Milling, OTR/L 04/03/2020, 6:32 PM  [facility] [address] [place], [place], [address] Phone: [phone]   Fax:  [phone]  Name: Carlicia Christos MRN: [id number] Date of Birth: [birth year]

## 2020-04-16 ENCOUNTER — Other Ambulatory Visit: Payer: Self-pay

## 2020-04-16 ENCOUNTER — Ambulatory Visit: Payer: BC Managed Care – PPO | Admitting: Physician Assistant

## 2020-04-16 VITALS — BP 147/78 | HR 97 | Ht 65.0 in | Wt 216.4 lb

## 2020-04-16 DIAGNOSIS — E039 Hypothyroidism, unspecified: Secondary | ICD-10-CM

## 2020-04-16 DIAGNOSIS — M722 Plantar fascial fibromatosis: Secondary | ICD-10-CM

## 2020-04-16 DIAGNOSIS — M6530 Trigger finger, unspecified finger: Secondary | ICD-10-CM

## 2020-04-16 DIAGNOSIS — E1149 Type 2 diabetes mellitus with other diabetic neurological complication: Secondary | ICD-10-CM | POA: Diagnosis not present

## 2020-04-16 MED ORDER — TRULICITY 0.75 MG/0.5ML ~~LOC~~ SOAJ: 0.7500 mg | SUBCUTANEOUS | 3 refills | Status: DC

## 2020-04-16 NOTE — Progress Notes (Signed)
Patient is here for Lab follow up. Patient is not fasting today. Patient states that she is feeling well. Patient has not checked her blood sugar in 3 or 4 days.

## 2020-04-16 NOTE — Patient Instructions (Signed)
Reminders:  Schedule your annual eye exam  Check on your Cologuard to make sure it has not expired  Follow-up with hand specialist   Plantar Fasciitis  Plantar fasciitis is a painful foot condition that affects the heel. It occurs when the band of tissue that connects the toes to the heel bone (plantar fascia) becomes irritated. This can happen as the result of exercising too much or doing other repetitive activities (overuse injury). The pain from plantar fasciitis can range from mild irritation to severe pain that makes it difficult to walk or move. The pain is usually worse in the morning after sleeping, or after sitting or lying down for a while. Pain may also be worse after long periods of walking or standing. What are the causes? This condition may be caused by:  Standing for long periods of time.  Wearing shoes that do not have good arch support.  Doing activities that put stress on joints (high-impact activities), including running, aerobics, and ballet.  Being overweight.  An abnormal way of walking (gait).  Tight muscles in the back of your lower leg (calf).  High arches in your feet.  Starting a new athletic activity. What are the signs or symptoms? The main symptom of this condition is heel pain. Pain may:  Be worse with first steps after a time of rest, especially in the morning after sleeping or after you have been sitting or lying down for a while.  Be worse after long periods of standing still.  Decrease after 30-45 minutes of activity, such as gentle walking. How is this diagnosed? This condition may be diagnosed based on your medical history and your symptoms. Your health care provider may ask questions about your activity level. Your health care provider will do a physical exam to check for:  A tender area on the bottom of your foot.  A high arch in your foot.  Pain when you move your foot.  Difficulty moving your foot. You may have imaging tests to  confirm the diagnosis, such as:  X-rays.  Ultrasound.  MRI. How is this treated? Treatment for plantar fasciitis depends on how severe your condition is. Treatment may include:  Rest, ice, applying pressure (compression), and raising the affected foot (elevation). This may be called RICE therapy. Your health care provider may recommend RICE therapy along with over-the-counter pain medicines to manage your pain.  Exercises to stretch your calves and your plantar fascia.  A splint that holds your foot in a stretched, upward position while you sleep (night splint).  Physical therapy to relieve symptoms and prevent problems in the future.  Injections of steroid medicine (cortisone) to relieve pain and inflammation.  Stimulating your plantar fascia with electrical impulses (extracorporeal shock wave therapy). This is usually the last treatment option before surgery.  Surgery, if other treatments have not worked after 12 months. Follow these instructions at home:  Managing pain, stiffness, and swelling  If directed, put ice on the painful area: ? Put ice in a plastic bag, or use a frozen bottle of water. ? Place a towel between your skin and the bag or bottle. ? Roll the bottom of your foot over the bag or bottle. ? Do this for 20 minutes, 2-3 times a day.  Wear athletic shoes that have air-sole or gel-sole cushions, or try wearing soft shoe inserts that are designed for plantar fasciitis.  Raise (elevate) your foot above the level of your heart while you are sitting or lying down. Activity  Avoid activities that cause pain. Ask your health care provider what activities are safe for you.  Do physical therapy exercises and stretches as told by your health care provider.  Try activities and forms of exercise that are easier on your joints (low-impact). Examples include swimming, water aerobics, and biking. General instructions  Take over-the-counter and prescription medicines  only as told by your health care provider.  Wear a night splint while sleeping, if told by your health care provider. Loosen the splint if your toes tingle, become numb, or turn cold and blue.  Maintain a healthy weight, or work with your health care provider to lose weight as needed.  Keep all follow-up visits as told by your health care provider. This is important. Contact a health care provider if you:  Have symptoms that do not go away after caring for yourself at home.  Have pain that gets worse.  Have pain that affects your ability to move or do your daily activities. Summary  Plantar fasciitis is a painful foot condition that affects the heel. It occurs when the band of tissue that connects the toes to the heel bone (plantar fascia) becomes irritated.  The main symptom of this condition is heel pain that may be worse after exercising too much or standing still for a long time.  Treatment varies, but it usually starts with rest, ice, compression, and elevation (RICE therapy) and over-the-counter medicines to manage pain. This information is not intended to replace advice given to you by your health care provider. Make sure you discuss any questions you have with your health care provider. Document Revised: 11/13/2017 Document Reviewed: 09/28/2017 Elsevier Patient Education  2020 [employer].

## 2020-04-16 NOTE — Progress Notes (Signed)
Established Patient Office Visit  Subjective:  Patient ID: Melanie Steele, female    DOB: February 06, 1961  Age: 59 y.o. MRN: [id number]  CC:  Chief Complaint  Patient presents with  . Diabetes    HPI Melanie Steele presents for medication refills.  Reports that she has been having difficulty affording her medication, states it needs to be prescribed on a 90-day prescription for it to be affordable.  Reports that she has been checking her blood sugar a few times a week, states that her blood glucose reading this morning was 179.  Reports that she recently started occupational therapy to help her with her finger pain, has been diagnosed with trigger finger on both ring fingers, states that she is going to schedule an appointment with a hand specialist.  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  Does not use any type of bracing, has tried over-the-counter Voltaren without much relief.  Reports she does have prescription for gabapentin 100 mg, but has not tried this recently.  Reports she has not completed her Cologuard test, reports kit is at her house.  Reports it has been over a year since her last eye exam.  Reports that she did receive the Covid vaccine, is fully vaccinated  Reports she has been having right heel pain, worse in the morning, states that she has been working at home so she is either wearing slippers or casual flat shoes during the day.  Denies bruising or swelling has not tried anything for relief  Past Medical History:  Diagnosis Date  . CPAP (continuous positive airway pressure) dependence   . Hyperlipidemia   . Hypertension   . Sleep apnea   . Type 2 diabetes mellitus ([place])   . Vitamin D deficiency     Past Surgical History:  Procedure Laterality Date  . ABDOMINAL HYSTERECTOMY    . TONSILLECTOMY    . TOOTH EXTRACTION    . TRIGGER FINGER RELEASE      Family History  Problem Relation  Age of Onset  . Diabetes Mother   . Hypertension Mother   . Stroke Father   . Hypertension Sister     Social History   Socioeconomic History  . Marital status: Widowed    Spouse name: Not on file  . Number of children: Not on file  . Years of education: Not on file  . Highest education level: Not on file  Occupational History  . Not on file  Tobacco Use  . Smoking status: Never Smoker  . Smokeless tobacco: Never Used  Substance and Sexual Activity  . Alcohol use: Yes  . Drug use: Not on file  . Sexual activity: Not on file  Other Topics Concern  . Not on file  Social History Narrative  . Not on file   Social Determinants of Health   Financial Resource Strain:   . Difficulty of Paying Living Expenses:   Food Insecurity:   . Worried About Charity fundraiser in the Last Year:   . Arboriculturist in the Last Year:   Transportation Needs:   . Film/video editor (Medical):   Marland Kitchen Lack of Transportation (Non-Medical):   Physical Activity:   . Days of Exercise per Week:   . Minutes of Exercise per Session:   Stress:   . Feeling of Stress :   Social Connections:   . Frequency of Communication  with Friends and Family:   . Frequency of Social Gatherings with Friends and Family:   . Attends Religious Services:   . Active Member of Clubs or Organizations:   . Attends Archivist Meetings:   Marland Kitchen Marital Status:   Intimate Partner Violence:   . Fear of Current or Ex-Partner:   . Emotionally Abused:   Marland Kitchen Physically Abused:   . Sexually Abused:     Outpatient Medications Prior to Visit  Medication Sig Dispense Refill  . amLODipine (NORVASC) 10 MG tablet Take 1 tablet (10 mg total) by mouth daily. 90 tablet 3  . canagliflozin (INVOKANA) 300 MG TABS tablet Take 1 tablet (300 mg total) by mouth daily. 90 tablet 3  . Continuous Blood Gluc Sensor ([place]) MISC Apply one sensor every 14 days. 6 each 0  . EPINEPHrine 0.3 mg/0.3 mL IJ SOAJ  injection epinephrine 0.3 mg/0.3 mL injection, auto-injector  Inject 0.3 mg IM as needed    . fexofenadine (ALLEGRA) 180 MG tablet Take 1 tablet by mouth every other day.     . fluticasone (FLONASE) 50 MCG/ACT nasal spray Place into both nostrils 2 (two) times daily.    Marland Kitchen gabapentin (NEURONTIN) 100 MG capsule Take 1 capsule (100 mg total) by mouth 3 (three) times daily as needed. 90 capsule 1  . hydrochlorothiazide (HYDRODIURIL) 25 MG tablet Take 1 tablet (25 mg total) by mouth every other day. 90 tablet 3  . metoprolol succinate (TOPROL-XL) 50 MG 24 hr tablet Take 1 tablet (50 mg total) by mouth daily. 90 tablet 2  . Multiple Vitamin (MULTIVITAMIN) tablet Take 1 tablet by mouth daily.    . potassium chloride SA (K-DUR) 20 MEQ tablet Take 1 tablet (20 mEq total) by mouth daily. 90 tablet 2  . rosuvastatin (CRESTOR) 5 MG tablet Take 1 tablet (5 mg total) by mouth every other day. 90 tablet 2  . thyroid (ARMOUR THYROID) 180 MG tablet Take 1 tablet (180 mg total) by mouth daily. 90 tablet 1  . Insulin Pen Needle (NOVOFINE) 30G X 8 MM MISC Use daily for Victoza injections (Patient not taking: Reported on 04/03/2020) 100 each 1  . Dulaglutide (TRULICITY) 0.25 [place]/7.0WC SOPN Inject 0.75 mg into the skin once a week. (Patient not taking: Reported on 04/16/2020) 0.5 mL 3   No facility-administered medications prior to visit.    Allergies  Allergen Reactions  . Lisinopril Anaphylaxis  . Other Anaphylaxis    Tree nuts  . Linagliptin Other (See Comments)    Upset stomach  . Metformin And Related Diarrhea    ROS Review of Systems  Constitutional: Positive for fatigue.  HENT: Negative.   Eyes: Negative.  Negative for visual disturbance.  Respiratory: Negative for cough and shortness of breath.   Cardiovascular: Negative for chest pain and palpitations.  Gastrointestinal: Negative.   Endocrine: Negative.   Genitourinary: Negative.   Musculoskeletal: Positive for arthralgias and gait problem.   Skin: Negative.   Allergic/Immunologic: Negative.   Hematological: Negative.   Psychiatric/Behavioral: Positive for sleep disturbance.      Objective:    Physical Exam  Constitutional: She is oriented to person, place, and time. She appears well-developed and well-nourished.  HENT:  Head: Normocephalic and atraumatic.  Right Ear: External ear normal.  Left Ear: External ear normal.  Nose: Nose normal.  Mouth/Throat: Oropharynx is clear and moist.  Eyes: Pupils are equal, round, and reactive to light. Conjunctivae and EOM are normal.  Cardiovascular: Normal rate, regular  rhythm, normal heart sounds and intact distal pulses.  Pulmonary/Chest: Effort normal and breath sounds normal.  Abdominal: Soft. Bowel sounds are normal.  Musculoskeletal:        General: Normal range of motion.     Cervical back: Normal range of motion and neck supple.  Neurological: She is alert and oriented to person, place, and time.  Skin: Skin is warm and dry.  Psychiatric: She has a normal mood and affect. Her behavior is normal. Judgment and thought content normal.  Vitals reviewed.   BP (!) 147/78   Pulse 97   Ht '5\' 5"'$  (1.651 m)   Wt 216 lb 6.4 oz (98.2 kg)   BMI 36.01 kg/m  Wt Readings from Last 3 Encounters:  04/16/20 216 lb 6.4 oz (98.2 kg)  10/13/19 214 lb (97.1 kg)  08/30/19 215 lb (97.5 kg)     Health Maintenance Due  Topic Date Due  . COVID-19 Vaccine (1) Never done  . COLONOSCOPY  Never done  . OPHTHALMOLOGY EXAM  06/15/2018  . HEMOGLOBIN A1C  04/12/2020    There are no preventive care reminders to display for this patient.  Lab Results  Component Value Date   TSH 0.116 (L) 10/13/2019   Lab Results  Component Value Date   WBC 10.2 02/28/2019   HGB 14.7 02/28/2019   HCT 44.0 02/28/2019   MCV 93 02/28/2019   PLT 268 02/28/2019   Lab Results  Component Value Date   NA 142 02/28/2019   K 3.5 02/28/2019   CO2 22 02/28/2019   GLUCOSE 130 (H) 02/28/2019   BUN 11  02/28/2019   CREATININE 0.70 02/28/2019   BILITOT 0.8 02/28/2019   ALKPHOS 79 02/28/2019   AST 16 02/28/2019   ALT 20 02/28/2019   PROT 7.5 02/28/2019   ALBUMIN 4.3 02/28/2019   CALCIUM 10.2 02/28/2019   Lab Results  Component Value Date   CHOL 187 10/13/2019   Lab Results  Component Value Date   HDL 56 10/13/2019   Lab Results  Component Value Date   LDLCALC 111 (H) 10/13/2019   Lab Results  Component Value Date   TRIG 109 10/13/2019   Lab Results  Component Value Date   CHOLHDL 3.3 10/13/2019   Lab Results  Component Value Date   HGBA1C 8.4 (H) 10/13/2019      Assessment & Plan:   Problem List Items Addressed This Visit      Endocrine   Acquired hypothyroidism   Relevant Orders   Thyroid Panel With TSH    Other Visit Diagnoses    Type 2 diabetes mellitus with other neurologic complication, without long-term current use of insulin (HCC)    -  Primary   Relevant Medications   Dulaglutide (TRULICITY) 8.41 YS/0.6TK SOPN   Other Relevant Orders   CBC with Differential/Platelet   Comp. Metabolic Panel (12)   Glucose (CBG) (Completed)   Trigger finger, acquired       Plantar fasciitis of right foot          Meds ordered this encounter  Medications  . Dulaglutide (TRULICITY) 1.60 FU/9.3AT SOPN    Sig: Inject 0.75 mg into the skin once a week.    Dispense:  0.5 mL    Refill:  3    90 day supply please    Order Specific Question:   Supervising Provider    Answer:   Asencion Noble E [[id number]]  1. Type 2 diabetes mellitus with other neurologic complication, without long-term current  use of insulin (HCC) Refilled current dosing, patient is not fasting, lipid panel and microalbumin completed within the last year. - Dulaglutide (TRULICITY) 2.17 VG/1.0YV SOPN; Inject 0.75 mg into the skin once a week.  Dispense: 0.5 mL; Refill: 3 - CBC with Differential/Platelet - Comp. Metabolic Panel (12) - Glucose (CBG)  2. Acquired hypothyroidism  - Thyroid Panel With  TSH  3. Trigger finger, acquired Gave patient education on bracing, stretching, patient will follow up with hand specialist, stated she did not need a referral  4. Plantar fasciitis of right foot Gave patient education on proper shoewear, stretching, icing   I have reviewed the patient's medical history (PMH, PSH, Social History, Family History, Medications, and allergies) , and have been updated if relevant. I spent 30 minutes reviewing chart and  face to face time with patient.    Follow-up: Return in about 3 months (around 07/17/2020) for with Dr. Juleen China at [facility].    Loraine Grip Mayers, PA-C

## 2020-04-17 LAB — GLUCOSE, POCT (MANUAL RESULT ENTRY): POC Glucose: 204 mg/dl — AB (ref 70–99)

## 2020-04-18 ENCOUNTER — Encounter: Payer: Self-pay | Admitting: Occupational Therapy

## 2020-04-18 ENCOUNTER — Other Ambulatory Visit: Payer: Self-pay

## 2020-04-18 ENCOUNTER — Telehealth: Payer: Self-pay | Admitting: *Deleted

## 2020-04-18 ENCOUNTER — Ambulatory Visit: Payer: BC Managed Care – PPO | Attending: Internal Medicine | Admitting: Occupational Therapy

## 2020-04-18 DIAGNOSIS — M79641 Pain in right hand: Secondary | ICD-10-CM | POA: Diagnosis not present

## 2020-04-18 DIAGNOSIS — M6281 Muscle weakness (generalized): Secondary | ICD-10-CM | POA: Diagnosis not present

## 2020-04-18 DIAGNOSIS — M79631 Pain in right forearm: Secondary | ICD-10-CM | POA: Diagnosis not present

## 2020-04-18 DIAGNOSIS — R600 Localized edema: Secondary | ICD-10-CM

## 2020-04-18 DIAGNOSIS — M25641 Stiffness of right hand, not elsewhere classified: Secondary | ICD-10-CM

## 2020-04-18 LAB — CBC WITH DIFFERENTIAL/PLATELET
Basophils Absolute: 0 10*3/uL (ref 0.0–0.2)
Basos: 1 %
EOS (ABSOLUTE): 0.1 10*3/uL (ref 0.0–0.4)
Eos: 2 %
Hematocrit: 47.6 % — ABNORMAL HIGH (ref 34.0–46.6)
Hemoglobin: 15.1 g/dL (ref 11.1–15.9)
Immature Grans (Abs): 0 10*3/uL (ref 0.0–0.1)
Immature Granulocytes: 0 %
Lymphocytes Absolute: 2.9 10*3/uL (ref 0.7–3.1)
Lymphs: 33 %
MCH: 30.9 pg (ref 26.6–33.0)
MCHC: 31.7 g/dL (ref 31.5–35.7)
MCV: 98 fL — ABNORMAL HIGH (ref 79–97)
Monocytes Absolute: 0.6 10*3/uL (ref 0.1–0.9)
Monocytes: 6 %
Neutrophils Absolute: 5.2 10*3/uL (ref 1.4–7.0)
Neutrophils: 58 %
Platelets: 281 10*3/uL (ref 150–450)
RBC: 4.88 x10E6/uL (ref 3.77–5.28)
RDW: 12.7 % (ref 11.7–15.4)
WBC: 8.8 10*3/uL (ref 3.4–10.8)

## 2020-04-18 LAB — THYROID PANEL WITH TSH
Free Thyroxine Index: 1.1 — ABNORMAL LOW (ref 1.2–4.9)
T3 Uptake Ratio: 20 % — ABNORMAL LOW (ref 24–39)
T4, Total: 5.6 ug/dL (ref 4.5–12.0)
TSH: 8.22 u[IU]/mL — ABNORMAL HIGH (ref 0.450–4.500)

## 2020-04-18 LAB — COMP. METABOLIC PANEL (12)
AST: 14 IU/L (ref 0–40)
Albumin/Globulin Ratio: 1.5 (ref 1.2–2.2)
Albumin: 4.7 g/dL (ref 3.8–4.9)
Alkaline Phosphatase: 86 IU/L (ref 39–117)
BUN/Creatinine Ratio: 17 (ref 9–23)
BUN: 11 mg/dL (ref 6–24)
Bilirubin Total: 0.5 mg/dL (ref 0.0–1.2)
Calcium: 10.4 mg/dL — ABNORMAL HIGH (ref 8.7–10.2)
Chloride: 101 mmol/L (ref 96–106)
Creatinine, Ser: 0.64 mg/dL (ref 0.57–1.00)
GFR calc Af Amer: 114 mL/min/{1.73_m2} (ref >59)
GFR calc non Af Amer: 99 mL/min/{1.73_m2} (ref >59)
Globulin, Total: 3.2 g/dL (ref 1.5–4.5)
Glucose: 167 mg/dL — ABNORMAL HIGH (ref 65–99)
Potassium: 3.6 mmol/L (ref 3.5–5.2)
Sodium: 143 mmol/L (ref 134–144)
Total Protein: 7.9 g/dL (ref 6.0–8.5)

## 2020-04-18 NOTE — Therapy (Signed)
[facility] [address] [place], [place], [address] Phone: [phone]   Fax:  [phone]  Occupational Therapy Treatment  Patient Details  Name: Melanie Steele MRN: [id number] Date of Birth: [birth year] Referring Provider (OT): Phill Myron   Encounter Date: 04/18/2020    Past Medical History:  Diagnosis Date  . CPAP (continuous positive airway pressure) dependence   . Hyperlipidemia   . Hypertension   . Sleep apnea   . Type 2 diabetes mellitus ([place])   . Vitamin D deficiency     Past Surgical History:  Procedure Laterality Date  . ABDOMINAL HYSTERECTOMY    . TONSILLECTOMY    . TOOTH EXTRACTION    . TRIGGER FINGER RELEASE      There were no vitals filed for this visit.  Subjective Assessment - 04/18/20 1744    Subjective   Pain in ring finger    Currently in Pain?  Yes    Pain Score  4     Pain Location  Finger (Comment which one)    Pain Orientation  Right    Pain Descriptors / Indicators  Aching    Pain Type  Chronic pain    Pain Onset  More than a month ago    Pain Frequency  Intermittent    Aggravating Factors   overuse    Pain Relieving Factors  rest              Treatment: [place] 29mhz, 0.8 w/cm 2, 20% x 8 mins to right palm and ring finger. No adverse reactions.  Pt was provided with education regarding iontophoreis and handout. PIP blocking to ring finger for A/ROM. Iontophoresis  With dexamethsone dose# 1 to palm at base of ring finger 1.5 cc small pad,  1.8-2.0 mA intensity over 22 mins,                OT Short Term Goals - 04/03/20 1822      OT SHORT TERM GOAL #1   Title  Patient will report pain no greater than 3/10 when completing HEP for right hand due 05/03/20    Time  4    Period  Weeks    Status  New    Target Date  05/03/20      OT SHORT TERM GOAL #2   Title  Patient will demonstrate awareness of activity modifications to reduce symptoms in right hand/arm  during work, and leisure activities    Time  4    Period  Weeks    Status  New      OT SHORT TERM GOAL #3   Title  Patient will demonstrate composite fist in right hand (pad to palm) with all four fingers actively to help with functional grasp    Time  4    Period  Weeks    Status  New      OT SHORT TERM GOAL #4   Title  Patient will demonstrate awareness of splint wear and care    Time  4    Period  Weeks    Status  New        OT Long Term Goals - 04/03/20 1824      OT LONG TERM GOAL #1   Title  Patient will complete updated HEP to address both range of motion and strengthening in right hand    Time  6    Period  Weeks    Status  New    Target Date  06/02/20      OT LONG TERM GOAL #2   Title  Patient will report pain no greater than 2/10 when functionally using right hand to carry a mug or glass of liquid 15 feet with right hand    Time  6    Period  Weeks    Status  New      OT LONG TERM GOAL #3   Title  Patient will report effective grip on garden tools, trowel, etc when working in her raised beds (vegetable garden)    Time  6    Period  Weeks    Status  New      OT LONG TERM GOAL #4   Title  Patient will demonstrate at least 7 lb increase in grip strength in right hand to help with ADL/IADL/ leisure activity    Baseline  24lb    Time  6    Period  Weeks    Status  New            Plan - 04/18/20 1740    Clinical Impression Statement  Pt is progressing towards goals. Iontophoresis dose #1 today.    OT Occupational Profile and History  Problem Focused Assessment - Including review of records relating to presenting problem    Occupational performance deficits (Please refer to evaluation for details):  ADL's;IADL's;Leisure;Rest and Sleep;Work    Marketing executive / Function / Physical Skills  ADL;UE functional use;Fascial restriction;Body mechanics;Decreased knowledge of use of DME;Flexibility;IADL;Pain;Strength;Dexterity;Edema;ROM    Rehab Potential  Good     Clinical Decision Making  Limited treatment options, no task modification necessary    Comorbidities Affecting Occupational Performance:  Presence of comorbidities impacting occupational performance    Comorbidities impacting occupational performance description:  Prior trigger thumb/finger release, DM    Modification or Assistance to Complete Evaluation   No modification of tasks or assist necessary to complete eval    OT Frequency  2x / week    OT Duration  4 weeks   Then 1x/week for 2 weeks   OT Treatment/Interventions  Self-care/ADL training;Electrical Stimulation;Iontophoresis;Therapeutic exercise;Patient/family education;Splinting;Moist Heat;Fluidtherapy;Therapeutic activities;Passive range of motion;Manual Therapy;DME and/or AE instruction;Contrast Bath;Ultrasound;Cryotherapy    Plan  continue ionto dose #2, consider splinting prn    Consulted and Agree with Plan of Care  Patient       Patient will benefit from skilled therapeutic intervention in order to improve the following deficits and impairments:   Body Structure / Function / Physical Skills: ADL, UE functional use, Fascial restriction, Body mechanics, Decreased knowledge of use of DME, Flexibility, IADL, Pain, Strength, Dexterity, Edema, ROM       Visit Diagnosis: Pain in right hand  Pain in right forearm  Stiffness of right hand, not elsewhere classified  Muscle weakness (generalized)  Localized edema    Problem List Patient Active Problem List   Diagnosis Date Noted  . Hyperlipidemia 07/08/2019  . Sleep apnea 07/08/2019  . Essential hypertension 03/02/2019  . Type 2 diabetes mellitus without complication, without long-term current use of insulin ([place]) 03/02/2019  . Acquired hypothyroidism 03/02/2019    Leylany Nored 04/18/2020, 5:45 PM  [facility] [address] [place], [place], [address] Phone: [phone]   Fax:  [phone]  Name: Melanie Steele MRN: [id number] Date of Birth: 1960/12/20

## 2020-04-18 NOTE — Telephone Encounter (Signed)
Patient verified DOB Patient shares she has been taking medication 6 out of 7 days. Patient has been Endo previously. Patient is aware of [facility] reaching out to schedule for 6 week TSH recheck.

## 2020-04-18 NOTE — Patient Instructions (Signed)
   AROM: PIP Flexion / Extension   Pinch bottom knuckle of __ring______ finger of hand to prevent bending. Actively bend middle knuckle until stretch is felt. Hold __5__ seconds. Relax. Straighten finger as far as possible. Repeat __10-15__ times per set. Do _2-3__ sessions per day.

## 2020-04-18 NOTE — Telephone Encounter (Signed)
-----   Message from Kennieth Rad, [place] sent at 04/18/2020  9:02 AM EDT ----- Please call patient and ask her if she has been taking her thyroid medication on a daily basis, also has she ever been seen by endocrinology? Thanks!

## 2020-04-23 ENCOUNTER — Other Ambulatory Visit: Payer: Self-pay

## 2020-04-23 ENCOUNTER — Ambulatory Visit: Payer: BC Managed Care – PPO | Admitting: Occupational Therapy

## 2020-04-23 ENCOUNTER — Encounter: Payer: Self-pay | Admitting: Occupational Therapy

## 2020-04-23 DIAGNOSIS — R600 Localized edema: Secondary | ICD-10-CM

## 2020-04-23 DIAGNOSIS — M79631 Pain in right forearm: Secondary | ICD-10-CM

## 2020-04-23 DIAGNOSIS — M25641 Stiffness of right hand, not elsewhere classified: Secondary | ICD-10-CM | POA: Diagnosis not present

## 2020-04-23 DIAGNOSIS — M79641 Pain in right hand: Secondary | ICD-10-CM | POA: Diagnosis not present

## 2020-04-23 DIAGNOSIS — M6281 Muscle weakness (generalized): Secondary | ICD-10-CM | POA: Diagnosis not present

## 2020-04-23 NOTE — Therapy (Signed)
[facility] [address] [place], [place], [address] Phone: [phone]   Fax:  [phone]  Occupational Therapy Treatment  Patient Details  Name: Melanie Steele MRN: [id number] Date of Birth: [birth year] Referring Provider (OT): Phill Myron   Encounter Date: 04/23/2020  OT End of Session - 04/23/20 1849    Visit Number  3    Number of Visits  11    Date for OT Re-Evaluation  06/02/20    Authorization Type  [employer]BS- 60 VL - OT/PT/SLP    OT Start Time  [id number]    OT Stop Time  1838    OT Time Calculation (min)  53 min    Activity Tolerance  Patient tolerated treatment well    Behavior During Therapy  [facility] for tasks assessed/performed       Past Medical History:  Diagnosis Date  . CPAP (continuous positive airway pressure) dependence   . Hyperlipidemia   . Hypertension   . Sleep apnea   . Type 2 diabetes mellitus ([place])   . Vitamin D deficiency     Past Surgical History:  Procedure Laterality Date  . ABDOMINAL HYSTERECTOMY    . TONSILLECTOMY    . TOOTH EXTRACTION    . TRIGGER FINGER RELEASE      There were no vitals filed for this visit.  Subjective Assessment - 04/23/20 1752    Subjective   I have been doing the exercises you all recommended.  I have not made the appointment with the hand specialist    Currently in Pain?  No/denies    Pain Score  0-No pain                   OT Treatments/Exercises (OP) - 04/23/20 0001      ADLs   LB Dressing  Discussed activity modification for dressing - donning shoes with use of shoe horn to avoid gripping and pulling tight shoe.      Cooking  Patient reports she was able to open a jar successfully.      ADL Comments  Reviewed all short and long term goals with patient.  DIscussefd emphasis on reducing inflammation, and gaining range of motion before working toward strengthening.        Exercises   Exercises  Hand      Hand Exercises   Other Hand  Exercises  Reviewed blocking exercises for right ring finger      Modalities   Modalities  Iontophoresis;Ultrasound      Ultrasound   Ultrasound Location  right ring finger and palm    Ultrasound Parameters  32mhz, continuous, 0.8 w/cm2, x 7 min      Iontophoresis   Type of Iontophoresis  Dexamethasone    Location  right palm - 4th digit    Dose  1.5 cc, 2.41mA intensity    Time  22 min             OT Education - 04/23/20 1848    Education Details  shoe horn for shoes to avoid gripping and pulling    Person(s) Educated  Patient    Methods  Explanation    Comprehension  Verbalized understanding       OT Short Term Goals - 04/23/20 1828      OT SHORT TERM GOAL #1   Title  Patient will report pain no greater than 3/10 when completing HEP for right hand due 05/03/20    Time  4  Period  Weeks    Status  On-going      OT SHORT TERM GOAL #2   Title  Patient will demonstrate awareness of activity modifications to reduce symptoms in right hand/arm during work, and leisure activities    Status  On-going      OT [place] #3   Title  Patient will demonstrate composite fist in right hand (pad to palm) with all four fingers actively to help with functional grasp    Status  On-going      OT SHORT TERM GOAL #4   Title  Patient will demonstrate awareness of splint wear and care    Status  On-going        OT Long Term Goals - 04/23/20 1834      OT LONG TERM GOAL #1   Status  On-going      OT LONG TERM GOAL #2   Title  Patient will report pain no greater than 2/10 when functionally using right hand to carry a mug or glass of liquid 15 feet with right hand    Status  On-going      OT LONG TERM GOAL #3   Title  Patient will report effective grip on garden tools, trowel, etc when working in her raised beds (vegetable garden)    Status  On-going      OT LONG TERM GOAL #4   Title  Patient will demonstrate at least 7 lb increase in grip strength in right hand to help  with ADL/IADL/ leisure activity    Status  On-going            Plan - 04/23/20 1850    Clinical Impression Statement  Patient is reporting decreased pain and inflammation.  Ionto dose #2 today    OT Frequency  2x / week    OT Duration  4 weeks    OT Treatment/Interventions  Self-care/ADL training;Electrical Stimulation;Iontophoresis;Therapeutic exercise;Patient/family education;Splinting;Moist Heat;Fluidtherapy;Therapeutic activities;Passive range of motion;Manual Therapy;DME and/or AE instruction;Contrast Bath;Ultrasound;Cryotherapy    Plan  continue ionto dose #3, consider splinting prn, activity modification    OT Home Exercise Plan  right ring blocking exercise    Consulted and Agree with Plan of Care  Patient       Patient will benefit from skilled therapeutic intervention in order to improve the following deficits and impairments:           Visit Diagnosis: Pain in right hand  Pain in right forearm  Stiffness of right hand, not elsewhere classified  Muscle weakness (generalized)  Localized edema    Problem List Patient Active Problem List   Diagnosis Date Noted  . Hyperlipidemia 07/08/2019  . Sleep apnea 07/08/2019  . Essential hypertension 03/02/2019  . Type 2 diabetes mellitus without complication, without long-term current use of insulin ([place]) 03/02/2019  . Acquired hypothyroidism 03/02/2019    Mariah Milling, OTR/L 04/23/2020, 6:53 PM  [facility] [address] [place], [place], [address] Phone: [phone]   Fax:  [phone]  Name: Melanie Steele MRN: [id number] Date of Birth: 1961/06/30

## 2020-04-25 ENCOUNTER — Ambulatory Visit: Payer: BC Managed Care – PPO | Admitting: Occupational Therapy

## 2020-04-25 ENCOUNTER — Other Ambulatory Visit: Payer: Self-pay

## 2020-04-25 DIAGNOSIS — R600 Localized edema: Secondary | ICD-10-CM | POA: Diagnosis not present

## 2020-04-25 DIAGNOSIS — M79631 Pain in right forearm: Secondary | ICD-10-CM | POA: Diagnosis not present

## 2020-04-25 DIAGNOSIS — M6281 Muscle weakness (generalized): Secondary | ICD-10-CM | POA: Diagnosis not present

## 2020-04-25 DIAGNOSIS — M79641 Pain in right hand: Secondary | ICD-10-CM

## 2020-04-25 DIAGNOSIS — M25641 Stiffness of right hand, not elsewhere classified: Secondary | ICD-10-CM | POA: Diagnosis not present

## 2020-04-25 NOTE — Therapy (Signed)
[facility] [address] [place], [place], [address] Phone: [phone]   Fax:  [phone]  Occupational Therapy Treatment  Patient Details  Name: Melanie Steele MRN: [id number] Date of Birth: 14-Feb-1961 Referring Provider (OT): Phill Myron   Encounter Date: 04/25/2020  OT End of Session - 04/25/20 1830    Visit Number  4    Number of Visits  11    Date for OT Re-Evaluation  06/02/20    Authorization Type  BCBS- 60 VL - OT/PT/SLP    OT Start Time  1750    OT Stop Time  1835    OT Time Calculation (min)  45 min    Activity Tolerance  Patient tolerated treatment well    Behavior During Therapy  [facility] for tasks assessed/performed       Past Medical History:  Diagnosis Date  . CPAP (continuous positive airway pressure) dependence   . Hyperlipidemia   . Hypertension   . Sleep apnea   . Type 2 diabetes mellitus ([place])   . Vitamin D deficiency     Past Surgical History:  Procedure Laterality Date  . ABDOMINAL HYSTERECTOMY    . TONSILLECTOMY    . TOOTH EXTRACTION    . TRIGGER FINGER RELEASE      There were no vitals filed for this visit.  Subjective Assessment - 04/25/20 1750    Subjective   Pt reports that she is having less pain and triggering    Currently in Pain?  Yes    Pain Score  4     Pain Location  Hand    Pain Orientation  Right    Pain Descriptors / Indicators  Aching    Pain Type  Chronic pain    Pain Onset  More than a month ago    Pain Frequency  Intermittent    Aggravating Factors   overuse    Pain Relieving Factors  rest,       Therapsit issued a foam ring splint for ring finger. Pt was instructed to remove if any issues.      Exercises  Exercises  Hand     Hand Exercises  Other Hand Exercises  Reviewed blocking exercises for right ring finger  Pt performed gentle MP flexion x 10 then gentle reverse blocking for all digits, gently    Modalities  Modalities   Iontophoresis;Ultrasound     Ultrasound  Ultrasound Location  right ring finger and palm   Ultrasound Parameters  16mhz, continuous, 0.8 w/cm2, x 8 min     Iontophoresis  Type of Iontophoresis  Dexamethasone   Location  right palm - 4th digit   Dose  1.5 cc, 2.4mA intensity   Time  22 min                  OT Education - 04/25/20 1822    Education Details  reviewed PIP blocking exercises, issued soft ring splint for ring finger to prevent hard composite grip    Person(s) Educated  Patient    Methods  Explanation;Demonstration    Comprehension  Verbalized understanding;Returned demonstration;Verbal cues required       OT Short Term Goals - 04/23/20 1828      OT SHORT TERM GOAL #1   Title  Patient will report pain no greater than 3/10 when completing HEP for right hand due 05/03/20    Time  4    Period  Weeks    Status  On-going  OT SHORT TERM GOAL #2   Title  Patient will demonstrate awareness of activity modifications to reduce symptoms in right hand/arm during work, and leisure activities    Status  On-going      OT [place] #3   Title  Patient will demonstrate composite fist in right hand (pad to palm) with all four fingers actively to help with functional grasp    Status  On-going      OT SHORT TERM GOAL #4   Title  Patient will demonstrate awareness of splint wear and care    Status  On-going        OT Long Term Goals - 04/23/20 1834      OT LONG TERM GOAL #1   Status  On-going      OT LONG TERM GOAL #2   Title  Patient will report pain no greater than 2/10 when functionally using right hand to carry a mug or glass of liquid 15 feet with right hand    Status  On-going      OT LONG TERM GOAL #3   Title  Patient will report effective grip on garden tools, trowel, etc when working in her raised beds (vegetable garden)    Status  On-going      OT LONG TERM GOAL #4   Title  Patient will demonstrate at least 7 lb increase in grip strength  in right hand to help with ADL/IADL/ leisure activity    Status  On-going            Plan - 04/25/20 1834    Clinical Impression Statement  Pt is progressing towards goals. Iontophoresis dose #3 today.    OT Occupational Profile and History  Problem Focused Assessment - Including review of records relating to presenting problem    Occupational performance deficits (Please refer to evaluation for details):  ADL's;IADL's;Leisure;Rest and Sleep;Work    Marketing executive / Function / Physical Skills  ADL;UE functional use;Fascial restriction;Body mechanics;Decreased knowledge of use of DME;Flexibility;IADL;Pain;Strength;Dexterity;Edema;ROM    Rehab Potential  Good    Clinical Decision Making  Limited treatment options, no task modification necessary    Comorbidities Affecting Occupational Performance:  Presence of comorbidities impacting occupational performance    Comorbidities impacting occupational performance description:  Prior trigger thumb/finger release, DM    Modification or Assistance to Complete Evaluation   No modification of tasks or assist necessary to complete eval    OT Frequency  2x / week    OT Duration  4 weeks   Then 1x/week for 2 weeks   OT Treatment/Interventions  Self-care/ADL training;Electrical Stimulation;Iontophoresis;Therapeutic exercise;Patient/family education;Splinting;Moist Heat;Fluidtherapy;Therapeutic activities;Passive range of motion;Manual Therapy;DME and/or AE instruction;Contrast Bath;Ultrasound;Cryotherapy    Plan  ionto dose 4, check on ring splint    OT Home Exercise Plan  right ring blocking exercise    Consulted and Agree with Plan of Care  Patient       Patient will benefit from skilled therapeutic intervention in order to improve the following deficits and impairments:   Body Structure / Function / Physical Skills: ADL, UE functional use, Fascial restriction, Body mechanics, Decreased knowledge of use of DME, Flexibility, IADL, Pain, Strength,  Dexterity, Edema, ROM       Visit Diagnosis: Pain in right hand  Stiffness of right hand, not elsewhere classified    Problem List Patient Active Problem List   Diagnosis Date Noted  . Hyperlipidemia 07/08/2019  . Sleep apnea 07/08/2019  . Essential hypertension 03/02/2019  . Type  2 diabetes mellitus without complication, without long-term current use of insulin ([place]) 03/02/2019  . Acquired hypothyroidism 03/02/2019    , 04/25/2020, 6:35 PM  [facility] [address] [place], [place], [address] Phone: [phone]   Fax:  [phone]  Name: Tawona Dever MRN: [id number] Date of Birth: 04-28-1961

## 2020-04-26 ENCOUNTER — Telehealth: Payer: Self-pay | Admitting: Internal Medicine

## 2020-04-26 NOTE — Telephone Encounter (Signed)
1) Medication(s) Requested (by name):thyroid (ARMOUR THYROID) 180 MG tablet [id number]   2) Pharmacy of Choice: [facility] on [address]   3) Special Requests: pt is out today    Approved medications will be sent to the pharmacy, we will reach out if there is an issue.  Requests made after 3pm may not be addressed until the following business day!  If a patient is unsure of the name of the medication(s) please note and ask patient to call back when they are able to provide all info, do not send to responsible party until all information is available!

## 2020-04-26 NOTE — Telephone Encounter (Signed)
Pt needs to have thyroid rechecked in June and can you order an A1C

## 2020-04-27 ENCOUNTER — Other Ambulatory Visit: Payer: Self-pay | Admitting: Internal Medicine

## 2020-04-27 DIAGNOSIS — E119 Type 2 diabetes mellitus without complications: Secondary | ICD-10-CM

## 2020-04-27 DIAGNOSIS — E039 Hypothyroidism, unspecified: Secondary | ICD-10-CM

## 2020-04-27 MED ORDER — THYROID 180 MG PO TABS: 180.0000 mg | ORAL_TABLET | Freq: Every day | ORAL | 0 refills | Status: DC

## 2020-04-27 NOTE — Telephone Encounter (Signed)
Pt needs a 3 month supply in order for insurance to pay for medication

## 2020-04-27 NOTE — Telephone Encounter (Signed)
I have placed the orders as future so that she can have them checked in June. Thanks!  Phill Myron, D.O. Primary Care at [facility]  04/27/2020, 8:48 AM

## 2020-04-27 NOTE — Telephone Encounter (Signed)
Short Rx sent in case dose needs to change after labs are drawn.

## 2020-04-30 ENCOUNTER — Other Ambulatory Visit: Payer: Self-pay

## 2020-04-30 ENCOUNTER — Encounter: Payer: Self-pay | Admitting: Occupational Therapy

## 2020-04-30 ENCOUNTER — Ambulatory Visit: Payer: BC Managed Care – PPO | Admitting: Occupational Therapy

## 2020-04-30 DIAGNOSIS — M79641 Pain in right hand: Secondary | ICD-10-CM

## 2020-04-30 DIAGNOSIS — M6281 Muscle weakness (generalized): Secondary | ICD-10-CM | POA: Diagnosis not present

## 2020-04-30 DIAGNOSIS — M79631 Pain in right forearm: Secondary | ICD-10-CM

## 2020-04-30 DIAGNOSIS — R600 Localized edema: Secondary | ICD-10-CM

## 2020-04-30 DIAGNOSIS — M25641 Stiffness of right hand, not elsewhere classified: Secondary | ICD-10-CM

## 2020-04-30 NOTE — Therapy (Signed)
[facility] [address] [place], [place], [address] Phone: [phone]   Fax:  [phone]  Occupational Therapy Treatment  Patient Details  Name: Melanie Steele MRN: [id number] Date of Birth: [birth year] Referring Provider (OT): Phill Myron   Encounter Date: 04/30/2020  OT End of Session - 04/30/20 1847    Visit Number  5    Number of Visits  11    Date for OT Re-Evaluation  06/02/20    Authorization Type  BCBS- 60 VL - OT/PT/SLP    OT Start Time  1750    OT Stop Time  1843    OT Time Calculation (min)  53 min    Activity Tolerance  Patient tolerated treatment well    Behavior During Therapy  [facility] for tasks assessed/performed       Past Medical History:  Diagnosis Date  . CPAP (continuous positive airway pressure) dependence   . Hyperlipidemia   . Hypertension   . Sleep apnea   . Type 2 diabetes mellitus ([place])   . Vitamin D deficiency     Past Surgical History:  Procedure Laterality Date  . ABDOMINAL HYSTERECTOMY    . TONSILLECTOMY    . TOOTH EXTRACTION    . TRIGGER FINGER RELEASE      There were no vitals filed for this visit.  Subjective Assessment - 04/30/20 1752    Subjective   Patient continues to report overall decreased pain, and not needing ibuprofen at night    Currently in Pain?  No/denies    Pain Score  0-No pain                   OT Treatments/Exercises (OP) - 04/30/20 0001      ADLs   Home Maintenance  Patient was able to use her right hand to complete some trim painting over the weekend without pain or triggering.      Work  Patient reports not really thinking about her hand when she is working, but needing to take occassional rest breaks.        Ultrasound   Ultrasound Location  right metacarpal 4th digit, ring finger volar and dorsal surface      Ultrasound Parameters  50mhz, continuous, 0.8 w/cm2 x 8 min    Ultrasound Goals  Pain      Iontophoresis   Type of  Iontophoresis  Dexamethasone    Location  right palm - 4th digit- 4th dose    Dose  1.5 cc, 2.42mA intensity    Time  7min   had two small water blisters at this rate, return to 2.0     Splinting   Splinting  Unable to check ring splint today patient left at home.                 OT Short Term Goals - 04/30/20 1849      OT SHORT TERM GOAL #1   Title  Patient will report pain no greater than 3/10 when completing HEP for right hand due 05/03/20    Status  Achieved      OT SHORT TERM GOAL #2   Title  Patient will demonstrate awareness of activity modifications to reduce symptoms in right hand/arm during work, and leisure activities    Status  On-going      OT [place] #3   Title  Patient will demonstrate composite fist in right hand (pad to palm) with all four fingers actively to help  with functional grasp    Status  On-going      OT SHORT TERM GOAL #4   Title  Patient will demonstrate awareness of splint wear and care    Status  On-going        OT Long Term Goals - 04/23/20 1834      OT LONG TERM GOAL #1   Status  On-going      OT LONG TERM GOAL #2   Title  Patient will report pain no greater than 2/10 when functionally using right hand to carry a mug or glass of liquid 15 feet with right hand    Status  On-going      OT LONG TERM GOAL #3   Title  Patient will report effective grip on garden tools, trowel, etc when working in her raised beds (vegetable garden)    Status  On-going      OT LONG TERM GOAL #4   Title  Patient will demonstrate at least 7 lb increase in grip strength in right hand to help with ADL/IADL/ leisure activity    Status  On-going            Plan - 04/30/20 1848    Clinical Impression Statement  Pt is progressing towards goals, reportin goverall decrease in pain, taking less pain medication. Iontophoresis dose #4 today.    OT Frequency  2x / week    OT Duration  4 weeks    OT Treatment/Interventions  Self-care/ADL  training;Electrical Stimulation;Iontophoresis;Therapeutic exercise;Patient/family education;Splinting;Moist Heat;Fluidtherapy;Therapeutic activities;Passive range of motion;Manual Therapy;DME and/or AE instruction;Contrast Bath;Ultrasound;Cryotherapy    Plan  ionto dose 5, check on ring splint    Consulted and Agree with Plan of Care  Patient       Patient will benefit from skilled therapeutic intervention in order to improve the following deficits and impairments:           Visit Diagnosis: Pain in right hand  Stiffness of right hand, not elsewhere classified  Pain in right forearm  Muscle weakness (generalized)  Localized edema    Problem List Patient Active Problem List   Diagnosis Date Noted  . Hyperlipidemia 07/08/2019  . Sleep apnea 07/08/2019  . Essential hypertension 03/02/2019  . Type 2 diabetes mellitus without complication, without long-term current use of insulin ([place]) 03/02/2019  . Acquired hypothyroidism 03/02/2019    Mariah Milling, OTR/L 04/30/2020, 6:52 PM  [facility] [address] [place], [place], [address] Phone: [phone]   Fax:  [phone]  Name: Melanie Steele MRN: [id number] Date of Birth: [birth year]

## 2020-05-02 ENCOUNTER — Other Ambulatory Visit: Payer: Self-pay

## 2020-05-02 ENCOUNTER — Ambulatory Visit: Payer: BC Managed Care – PPO | Admitting: Occupational Therapy

## 2020-05-02 ENCOUNTER — Encounter: Payer: Self-pay | Admitting: Occupational Therapy

## 2020-05-02 DIAGNOSIS — M25641 Stiffness of right hand, not elsewhere classified: Secondary | ICD-10-CM | POA: Diagnosis not present

## 2020-05-02 DIAGNOSIS — R600 Localized edema: Secondary | ICD-10-CM

## 2020-05-02 DIAGNOSIS — M6281 Muscle weakness (generalized): Secondary | ICD-10-CM | POA: Diagnosis not present

## 2020-05-02 DIAGNOSIS — M79631 Pain in right forearm: Secondary | ICD-10-CM | POA: Diagnosis not present

## 2020-05-02 DIAGNOSIS — M79641 Pain in right hand: Secondary | ICD-10-CM

## 2020-05-02 MED ORDER — THYROID 180 MG PO TABS: 180.0000 mg | ORAL_TABLET | Freq: Every day | ORAL | 0 refills | Status: DC

## 2020-05-02 NOTE — Therapy (Signed)
[facility] [address] [place], [place], [address] Phone: [phone]   Fax:  [phone]  Occupational Therapy Treatment  Patient Details  Name: Melanie Steele MRN: [id number] Date of Birth: 1960/12/31 Referring Provider (OT): Phill Myron   Encounter Date: 05/02/2020  OT End of Session - 05/02/20 1826    Visit Number  6    Number of Visits  11    Date for OT Re-Evaluation  06/02/20    Authorization Type  BCBS- 60 VL - OT/PT/SLP    OT Start Time  1750    OT Stop Time  [id number]    OT Time Calculation (min)  40 min    Activity Tolerance  Patient tolerated treatment well    Behavior During Therapy  [facility] for tasks assessed/performed       Past Medical History:  Diagnosis Date  . CPAP (continuous positive airway pressure) dependence   . Hyperlipidemia   . Hypertension   . Sleep apnea   . Type 2 diabetes mellitus ([place])   . Vitamin D deficiency     Past Surgical History:  Procedure Laterality Date  . ABDOMINAL HYSTERECTOMY    . TONSILLECTOMY    . TOOTH EXTRACTION    . TRIGGER FINGER RELEASE      There were no vitals filed for this visit.  Subjective Assessment - 05/02/20 1823    Subjective   Patient continues to report overall decreased pain,    Currently in Pain?  Yes    Pain Score  3     Pain Location  Hand    Pain Orientation  Right    Pain Descriptors / Indicators  Aching    Pain Type  Acute pain    Pain Onset  More than a month ago    Pain Frequency  Intermittent    Aggravating Factors   use    Pain Relieving Factors  rest            Ultrasound  Ultrasound Location  right metacarpal 4th digit, ring finger volar and dorsal surface     Ultrasound Parameters  32mhz, continuous, 0.8 w/cm2 x 8 min   Ultrasound Goals  Pain     Iontophoresis  Type of Iontophoresis  Dexamethasone   Location  right palm - 4th digit- 4th dose   Dose  1.5 cc, 2.0 mA intensity   Time 20 mins, no adverse  reactions      Reviewed PIP blocking exercise, and discussed ring splint, pt arrived wearing , pt reports splint is working well.No problems              OT Short Term Goals - 04/30/20 1849      OT SHORT TERM GOAL #1   Title  Patient will report pain no greater than 3/10 when completing HEP for right hand due 05/03/20    Status  Achieved      OT SHORT TERM GOAL #2   Title  Patient will demonstrate awareness of activity modifications to reduce symptoms in right hand/arm during work, and leisure activities    Status  On-going      OT [place] #3   Title  Patient will demonstrate composite fist in right hand (pad to palm) with all four fingers actively to help with functional grasp    Status  On-going      OT SHORT TERM GOAL #4   Title  Patient will demonstrate awareness of splint wear and care  Status  On-going        OT Long Term Goals - 04/23/20 1834      OT LONG TERM GOAL #1   Status  On-going      OT LONG TERM GOAL #2   Title  Patient will report pain no greater than 2/10 when functionally using right hand to carry a mug or glass of liquid 15 feet with right hand    Status  On-going      OT LONG TERM GOAL #3   Title  Patient will report effective grip on garden tools, trowel, etc when working in her raised beds (vegetable garden)    Status  On-going      OT LONG TERM GOAL #4   Title  Patient will demonstrate at least 7 lb increase in grip strength in right hand to help with ADL/IADL/ leisure activity    Status  On-going            Plan - 05/02/20 1824    Clinical Impression Statement  Pt is progressing towards goals. Iontophoresis dose #4 today.    OT Occupational Profile and History  Problem Focused Assessment - Including review of records relating to presenting problem    Occupational performance deficits (Please refer to evaluation for details):  ADL's;IADL's;Leisure;Rest and Sleep;Work    Marketing executive / Function / Physical Skills   ADL;UE functional use;Fascial restriction;Body mechanics;Decreased knowledge of use of DME;Flexibility;IADL;Pain;Strength;Dexterity;Edema;ROM    Rehab Potential  Good    Clinical Decision Making  Limited treatment options, no task modification necessary    Comorbidities Affecting Occupational Performance:  Presence of comorbidities impacting occupational performance    Comorbidities impacting occupational performance description:  Prior trigger thumb/finger release, DM    Modification or Assistance to Complete Evaluation   No modification of tasks or assist necessary to complete eval    OT Frequency  2x / week    OT Duration  4 weeks   Then 1x/week for 2 weeks   OT Treatment/Interventions  Self-care/ADL training;Electrical Stimulation;Iontophoresis;Therapeutic exercise;Patient/family education;Splinting;Moist Heat;Fluidtherapy;Therapeutic activities;Passive range of motion;Manual Therapy;DME and/or AE instruction;Contrast Bath;Ultrasound;Cryotherapy    Plan  continue ionto dose 5, [place],    OT Home Exercise Plan  right ring blocking exercise    Consulted and Agree with Plan of Care  Patient       Patient will benefit from skilled therapeutic intervention in order to improve the following deficits and impairments:   Body Structure / Function / Physical Skills: ADL, UE functional use, Fascial restriction, Body mechanics, Decreased knowledge of use of DME, Flexibility, IADL, Pain, Strength, Dexterity, Edema, ROM       Visit Diagnosis: Pain in right hand  Stiffness of right hand, not elsewhere classified  Localized edema    Problem List Patient Active Problem List   Diagnosis Date Noted  . Hyperlipidemia 07/08/2019  . Sleep apnea 07/08/2019  . Essential hypertension 03/02/2019  . Type 2 diabetes mellitus without complication, without long-term current use of insulin ([place]) 03/02/2019  . Acquired hypothyroidism 03/02/2019    Raenell Mensing 05/02/2020, 6:27 PM  [facility] [address] [place], [place], [address] Phone: [phone]   Fax:  [phone]  Name: Melanie Steele MRN: [id number] Date of Birth: 1961-04-11

## 2020-05-07 ENCOUNTER — Ambulatory Visit: Payer: BC Managed Care – PPO | Admitting: Occupational Therapy

## 2020-05-07 ENCOUNTER — Other Ambulatory Visit: Payer: Self-pay

## 2020-05-07 DIAGNOSIS — M25641 Stiffness of right hand, not elsewhere classified: Secondary | ICD-10-CM

## 2020-05-07 DIAGNOSIS — R600 Localized edema: Secondary | ICD-10-CM

## 2020-05-07 DIAGNOSIS — M79631 Pain in right forearm: Secondary | ICD-10-CM | POA: Diagnosis not present

## 2020-05-07 DIAGNOSIS — M79641 Pain in right hand: Secondary | ICD-10-CM

## 2020-05-07 DIAGNOSIS — M6281 Muscle weakness (generalized): Secondary | ICD-10-CM | POA: Diagnosis not present

## 2020-05-07 NOTE — Therapy (Signed)
[facility] [address] [place], [place], [address] Phone: [phone]   Fax:  [phone]  Occupational Therapy Treatment  Patient Details  Name: Melanie Steele MRN: [id number] Date of Birth: 12/12/1961 Referring Provider (OT): Phill Myron   Encounter Date: 05/07/2020  OT End of Session - 05/07/20 1848    Visit Number  7    Number of Visits  11    Date for OT Re-Evaluation  06/02/20    Authorization Type  BCBS- 60 VL - OT/PT/SLP    OT Start Time  1750    OT Stop Time  [id number]    OT Time Calculation (min)  43 min    Activity Tolerance  Patient tolerated treatment well    Behavior During Therapy  [facility] for tasks assessed/performed       Past Medical History:  Diagnosis Date  . CPAP (continuous positive airway pressure) dependence   . Hyperlipidemia   . Hypertension   . Sleep apnea   . Type 2 diabetes mellitus ([place])   . Vitamin D deficiency     Past Surgical History:  Procedure Laterality Date  . ABDOMINAL HYSTERECTOMY    . TONSILLECTOMY    . TOOTH EXTRACTION    . TRIGGER FINGER RELEASE      There were no vitals filed for this visit.                OT Treatments/Exercises (OP) - 05/07/20 0001      ADLs   Cooking  Reviewed ergonomic jar and bottle openers    ADL Comments  Reviewed all remaining goals.  Patient completing her 6th ionto treatment today.  Patient reports right hand is overall significantly better, but she is still experiencing pain and tightness.  She still cannot complete a composite fist.  Encouraged her at this point to confer with a hand specialist to ensure that this is best course of action, or if there may be other alternatives for her.        Ultrasound   Ultrasound Location  Right base of ring finger- volar     Ultrasound Parameters  65mhz, continuous, 0.8w/cm2, 5 min    Ultrasound Goals  Pain      Iontophoresis   Type of Iontophoresis  Dexamethasone    Location   right palm - 4th digit- 4th dose    Dose  1.5 cc, 2.62mA intensity    Time  20 min             OT Education - 05/07/20 1848    Education Details  reviewed ergonomically designed wine bottle and jar openers.    Person(s) Educated  Patient    Methods  Explanation;Other (comment)   looked on line to show vatrious options   Comprehension  Verbalized understanding       OT Short Term Goals - 05/07/20 1817      OT SHORT TERM GOAL #2   Title  Patient will demonstrate awareness of activity modifications to reduce symptoms in right hand/arm during work, and leisure activities    Status  Achieved      OT [place] #4   Title  Patient will demonstrate awareness of splint wear and care    Status  Achieved        OT Long Term Goals - 05/07/20 1820      OT LONG TERM GOAL #1   Title  Patient will complete updated HEP to address both range of motion  and strengthening in right hand    Status  On-going            Plan - 05/07/20 1849    Clinical Impression Statement  Patient showing progress, although feels she mayhave overdone it picking up a large package at the store - increased pain in stiffness in R hand today    Rehab Potential  Good    OT Frequency  2x / week    OT Duration  4 weeks    Plan  Ultrasound right dorsum ring finger, base of ring finger, range of motion, possibly light strengthening, functional grip    OT Home Exercise Plan  right ring blocking exercise    Consulted and Agree with Plan of Care  Patient       Patient will benefit from skilled therapeutic intervention in order to improve the following deficits and impairments:           Visit Diagnosis: Pain in right hand  Stiffness of right hand, not elsewhere classified  Localized edema  Pain in right forearm  Muscle weakness (generalized)    Problem List Patient Active Problem List   Diagnosis Date Noted  . Hyperlipidemia 07/08/2019  . Sleep apnea 07/08/2019  . Essential  hypertension 03/02/2019  . Type 2 diabetes mellitus without complication, without long-term current use of insulin ([place]) 03/02/2019  . Acquired hypothyroidism 03/02/2019    Melanie Steele, OTR/L 05/07/2020, 6:52 PM  [facility] [address] [place], [place], [address] Phone: [phone]   Fax:  [phone]  Name: Melanie Steele MRN: [id number] Date of Birth: August 11, 1961

## 2020-05-10 ENCOUNTER — Ambulatory Visit: Payer: BC Managed Care – PPO | Admitting: Occupational Therapy

## 2020-05-15 ENCOUNTER — Ambulatory Visit: Payer: BC Managed Care – PPO | Attending: Internal Medicine | Admitting: Occupational Therapy

## 2020-05-15 ENCOUNTER — Other Ambulatory Visit: Payer: Self-pay

## 2020-05-15 ENCOUNTER — Encounter: Payer: Self-pay | Admitting: Occupational Therapy

## 2020-05-15 DIAGNOSIS — M79641 Pain in right hand: Secondary | ICD-10-CM | POA: Diagnosis not present

## 2020-05-15 DIAGNOSIS — M79631 Pain in right forearm: Secondary | ICD-10-CM

## 2020-05-15 DIAGNOSIS — M25641 Stiffness of right hand, not elsewhere classified: Secondary | ICD-10-CM | POA: Diagnosis not present

## 2020-05-15 DIAGNOSIS — M6281 Muscle weakness (generalized): Secondary | ICD-10-CM | POA: Diagnosis not present

## 2020-05-15 DIAGNOSIS — R600 Localized edema: Secondary | ICD-10-CM | POA: Diagnosis not present

## 2020-05-15 NOTE — Therapy (Signed)
[facility] [address] [place], [place], [address] Phone: [phone]   Fax:  [phone]  Occupational Therapy Treatment  Patient Details  Name: Melanie Steele MRN: [id number] Date of Birth: 1961-07-15 Referring Provider (OT): Phill Myron   Encounter Date: 05/15/2020  OT End of Session - 05/15/20 1709    Visit Number  8    Number of Visits  11    Date for OT Re-Evaluation  06/02/20    Authorization Type  BCBS- 60 VL - OT/PT/SLP    OT Start Time  [id number]    OT Stop Time  1700    OT Time Calculation (min)  43 min    Activity Tolerance  Patient tolerated treatment well    Behavior During Therapy  [facility] for tasks assessed/performed       Past Medical History:  Diagnosis Date  . CPAP (continuous positive airway pressure) dependence   . Hyperlipidemia   . Hypertension   . Sleep apnea   . Type 2 diabetes mellitus ([place])   . Vitamin D deficiency     Past Surgical History:  Procedure Laterality Date  . ABDOMINAL HYSTERECTOMY    . TONSILLECTOMY    . TOOTH EXTRACTION    . TRIGGER FINGER RELEASE      There were no vitals filed for this visit.  Subjective Assessment - 05/15/20 1617    Subjective   Patient reports pain 6/10 last night after travelling to [place] for a funeral    Currently in Pain?  Yes    Pain Score  4     Pain Location  Hand    Pain Orientation  Right    Pain Descriptors / Indicators  Aching    Pain Type  Chronic pain    Pain Radiating Towards  palm into finger    Pain Onset  More than a month ago    Pain Frequency  Intermittent    Aggravating Factors   use    Pain Relieving Factors  rest                   OT Treatments/Exercises (OP) - 05/15/20 0001      Hand Exercises   Other Hand Exercises  Patient completed gentle hand exercise while in fluidotherapy,  Following ultrasound, reviewed mass grasp.  Patient able to complete pad to palm contact in ruight hand this session.        Modalities   Modalities  Fluidotherapy      Ultrasound   Ultrasound Location  right palm, ring finger, dorsum of metacarpals    Ultrasound Parameters  36mhz, continuous, 8 min, 0.8w/cm2    Ultrasound Goals  Pain      RUE Fluidotherapy   Number Minutes Fluidotherapy  12 Minutes    RUE Fluidotherapy Location  Hand;Wrist;Forearm    Comments  pain relief at start of session.  Patient reported pain of 4 prior, and pain of 2 after fluido             OT Education - 05/15/20 1708    Education Details  Reviewed importance of alternating activity and rest, going slow with household projects - not overworking hands, use of heat and exercise to maintain mobility    Person(s) Educated  Patient    Methods  Explanation    Comprehension  Verbalized understanding       OT Short Term Goals - 05/15/20 1712      OT SHORT TERM GOAL #2  Title  Patient will demonstrate awareness of activity modifications to reduce symptoms in right hand/arm during work, and leisure activities    Status  Achieved      OT [place] #3   Title  Patient will demonstrate composite fist in right hand (pad to palm) with all four fingers actively to help with functional grasp    Status  Achieved      OT SHORT TERM GOAL #4   Title  Patient will demonstrate awareness of splint wear and care    Status  Achieved        OT Long Term Goals - 05/15/20 1712      OT LONG TERM GOAL #1   Title  Patient will complete updated HEP to address both range of motion and strengthening in right hand    Status  On-going      OT LONG TERM GOAL #2   Title  Patient will report pain no greater than 2/10 when functionally using right hand to carry a mug or glass of liquid 15 feet with right hand    Status  On-going      OT LONG TERM GOAL #3   Title  Patient will report effective grip on garden tools, trowel, etc when working in her raised beds (vegetable garden)    Status  On-going      OT LONG TERM GOAL #4   Title   Patient will demonstrate at least 7 lb increase in grip strength in right hand to help with ADL/IADL/ leisure activity    Status  On-going            Plan - 05/15/20 1709    Clinical Impression Statement  Patient with increased report of pain after weekend.  She drove to [place] for a close friend's funeral, and also learned of her cousin's unexpected passing.  Patient found this weekend stressful, and was not able to complete her exercises/stretches to help reduce pain. Respoded well to therapy, and will return to routine at home.    OT Frequency  1x / week    OT Duration  4 weeks    OT Treatment/Interventions  Self-care/ADL training;Electrical Stimulation;Iontophoresis;Therapeutic exercise;Patient/family education;Splinting;Moist Heat;Fluidtherapy;Therapeutic activities;Passive range of motion;Manual Therapy;DME and/or AE instruction;Contrast Bath;Ultrasound;Cryotherapy    Plan  Ultrasound right dorsum ring finger, base of ring finger, range of motion, possibly light strengthening, functional grip    OT Home Exercise Plan  right ring blocking exercise    Consulted and Agree with Plan of Care  Patient       Patient will benefit from skilled therapeutic intervention in order to improve the following deficits and impairments:           Visit Diagnosis: Pain in right hand  Stiffness of right hand, not elsewhere classified  Localized edema  Pain in right forearm  Muscle weakness (generalized)    Problem List Patient Active Problem List   Diagnosis Date Noted  . Hyperlipidemia 07/08/2019  . Sleep apnea 07/08/2019  . Essential hypertension 03/02/2019  . Type 2 diabetes mellitus without complication, without long-term current use of insulin ([place]) 03/02/2019  . Acquired hypothyroidism 03/02/2019    Mariah Milling, OTR/L 05/15/2020, 5:13 PM  [facility] [address] [place], [place], [address] Phone: [phone]    Fax:  [phone]  Name: Melanie Steele MRN: [id number] Date of Birth: 1961/11/13

## 2020-05-22 ENCOUNTER — Other Ambulatory Visit: Payer: Self-pay

## 2020-05-22 ENCOUNTER — Ambulatory Visit: Payer: BC Managed Care – PPO | Admitting: Occupational Therapy

## 2020-05-22 ENCOUNTER — Encounter: Payer: Self-pay | Admitting: Occupational Therapy

## 2020-05-22 DIAGNOSIS — M6281 Muscle weakness (generalized): Secondary | ICD-10-CM | POA: Diagnosis not present

## 2020-05-22 DIAGNOSIS — M25641 Stiffness of right hand, not elsewhere classified: Secondary | ICD-10-CM

## 2020-05-22 DIAGNOSIS — M79631 Pain in right forearm: Secondary | ICD-10-CM

## 2020-05-22 DIAGNOSIS — M79641 Pain in right hand: Secondary | ICD-10-CM | POA: Diagnosis not present

## 2020-05-22 DIAGNOSIS — R600 Localized edema: Secondary | ICD-10-CM

## 2020-05-22 NOTE — Therapy (Signed)
[facility] [address] [place], [place], [address] Phone: [phone]   Fax:  [phone]  Occupational Therapy Treatment  Patient Details  Name: Melanie Steele MRN: [id number] Date of Birth: [birth year] Referring Provider (OT): Phill Myron   Encounter Date: 05/22/2020  OT End of Session - 05/22/20 1842    Visit Number  9    Number of Visits  11    Date for OT Re-Evaluation  06/02/20    Authorization Type  BCBS- 60 VL - OT/PT/SLP    OT Start Time  1750    OT Stop Time  2458    OT Time Calculation (min)  43 min    Activity Tolerance  Patient tolerated treatment well    Behavior During Therapy  [facility] for tasks assessed/performed       Past Medical History:  Diagnosis Date  . CPAP (continuous positive airway pressure) dependence   . Hyperlipidemia   . Hypertension   . Sleep apnea   . Type 2 diabetes mellitus ([place])   . Vitamin D deficiency     Past Surgical History:  Procedure Laterality Date  . ABDOMINAL HYSTERECTOMY    . TONSILLECTOMY    . TOOTH EXTRACTION    . TRIGGER FINGER RELEASE      There were no vitals filed for this visit.  Subjective Assessment - 05/22/20 1752    Subjective   Patient has been gardening    Currently in Pain?  Yes    Pain Score  3     Pain Location  Hand         [facility] OT Assessment - 05/22/20 0001      Hand Function   Right Hand Grip (lbs)  33.2    Left Hand Grip (lbs)  47.6               OT Treatments/Exercises (OP) - 05/22/20 0001      ADLs   Cooking  Patient demonstrated ability to carry wine glass, glass, or mug of liquid x 15 feet with right hand with no increase in symptoms.      ADL Comments  Reviewed gardening goals - patient reports planting in her raised beds, and repotting some plants - not specifically with garden trowel - but effectively with tool.        Hand Exercises   Other Hand Exercises  See patient instructions - patient issued therapy  putty exercises to begin light resistance training.        Ultrasound   Ultrasound Location  right palm, ring finger volar and dorsal    Ultrasound Parameters  47mhz, continuous, 0.8 w/cm2, x 8 min    Ultrasound Goals  Pain;Edema             OT Education - 05/22/20 1842    Education Details  Therapy putty exercises - yellow, remaining goals and plan of care    Person(s) Educated  Patient    Methods  Explanation    Comprehension  Verbalized understanding       OT Short Term Goals - 05/15/20 1712      OT SHORT TERM GOAL #2   Title  Patient will demonstrate awareness of activity modifications to reduce symptoms in right hand/arm during work, and leisure activities    Status  Achieved      OT [place] #3   Title  Patient will demonstrate composite fist in right hand (pad to palm) with all four fingers actively  to help with functional grasp    Status  Achieved      OT SHORT TERM GOAL #4   Title  Patient will demonstrate awareness of splint wear and care    Status  Achieved        OT Long Term Goals - 05/22/20 1845      OT LONG TERM GOAL #1   Title  Patient will complete updated HEP to address both range of motion and strengthening in right hand    Status  On-going      OT LONG TERM GOAL #2   Title  Patient will report pain no greater than 2/10 when functionally using right hand to carry a mug or glass of liquid 15 feet with right hand    Status  Achieved      OT LONG TERM GOAL #3   Title  Patient will report effective grip on garden tools, trowel, etc when working in her raised beds (vegetable garden)    Status  Achieved      OT LONG TERM GOAL #4   Title  Patient will demonstrate at least 7 lb increase in grip strength in right hand to help with ADL/IADL/ leisure activity    Status  Achieved   33.2!           Plan - 05/22/20 1847    Plan  Ultrasound right dorsum ring finger, base of ring finger, range of motion, possibly light strengthening,  functional grip - check HEP strengthening and ROM       Patient will benefit from skilled therapeutic intervention in order to improve the following deficits and impairments:           Visit Diagnosis: Pain in right hand  Stiffness of right hand, not elsewhere classified  Localized edema  Pain in right forearm    Problem List Patient Active Problem List   Diagnosis Date Noted  . Hyperlipidemia 07/08/2019  . Sleep apnea 07/08/2019  . Essential hypertension 03/02/2019  . Type 2 diabetes mellitus without complication, without long-term current use of insulin ([place]) 03/02/2019  . Acquired hypothyroidism 03/02/2019    Mariah Milling, OTR/L 05/22/2020, 6:47 PM  [facility] [address] [place], [place], [address] Phone: [phone]   Fax:  [phone]  Name: Melanie Steele MRN: [id number] Date of Birth: 03-08-1961

## 2020-05-22 NOTE — Patient Instructions (Signed)
11. Grip Strengthening (Resistive Putty)   Squeeze putty using thumb and all fingers. Repeat 15 times. Do 1-2 sessions per day.   Extension (Assistive Putty)   Roll putty back and forth, being sure to use all fingertips. Repeat 3 times. Do 1-2 sessions per day.  Then pinch as below.   Palmar Pinch Strengthening (Resistive Putty)   Pinch putty between thumb and each fingertip in turn after rolling out           IP Fisting (Resistive Putty)   Keeping knuckles straight, bend fingertips to squeeze putty. Repeat 10 times. Do 1-2 sessions per day.  MP Flexion (Resistive Putty)   Bending only at large knuckles, press putty down against thumb. Keep fingertips straight. Repeat 10 times. Do 1-2 sessions per day.       FINGERS: Extension (Putty)    Open hand and fingers to flatten putty.  3 sets per day, 6-7 days per week      FINGERS: Intrinsic (Putty)    Flatten putty by separating fingers. Keep fingers straight.  3 sets per day, 6-7 days per week

## 2020-05-24 DIAGNOSIS — T148XXA Other injury of unspecified body region, initial encounter: Secondary | ICD-10-CM | POA: Diagnosis not present

## 2020-05-29 ENCOUNTER — Telehealth: Payer: Self-pay | Admitting: Internal Medicine

## 2020-05-29 NOTE — Telephone Encounter (Signed)
Pt wants to be checked for lyme disease from tick bite added to the labs on the 23 from a UC visit fu.

## 2020-05-29 NOTE — Telephone Encounter (Signed)
Called pt lvm for pt to call the office back

## 2020-05-29 NOTE — Telephone Encounter (Signed)
Patient would need an appointment with provider for evaluation. Unable to just order labs for "lyme disease" per patient request.

## 2020-05-31 ENCOUNTER — Other Ambulatory Visit: Payer: Self-pay

## 2020-05-31 ENCOUNTER — Ambulatory Visit: Payer: BC Managed Care – PPO | Admitting: Occupational Therapy

## 2020-05-31 DIAGNOSIS — M79631 Pain in right forearm: Secondary | ICD-10-CM | POA: Diagnosis not present

## 2020-05-31 DIAGNOSIS — M6281 Muscle weakness (generalized): Secondary | ICD-10-CM

## 2020-05-31 DIAGNOSIS — M79641 Pain in right hand: Secondary | ICD-10-CM

## 2020-05-31 DIAGNOSIS — R600 Localized edema: Secondary | ICD-10-CM | POA: Diagnosis not present

## 2020-05-31 DIAGNOSIS — M25641 Stiffness of right hand, not elsewhere classified: Secondary | ICD-10-CM | POA: Diagnosis not present

## 2020-05-31 NOTE — Therapy (Signed)
[facility] [address] [place], [place], [address] Phone: [phone]   Fax:  [phone]  Occupational Therapy Treatment  Patient Details  Name: Melanie Steele MRN: [id number] Date of Birth: [birth year] Referring Provider (OT): Phill Myron   Encounter Date: 05/31/2020   OT End of Session - 05/31/20 1448    Visit Number 10    Number of Visits 11    Date for OT Re-Evaluation 06/02/20    Authorization Type BCBS- 60 VL - OT/PT/SLP    OT Start Time 1400    OT Stop Time 1440   Ice 5 minutes - unbillable   OT Time Calculation (min) 40 min    Activity Tolerance Patient tolerated treatment well    Behavior During Therapy [facility] for tasks assessed/performed           Past Medical History:  Diagnosis Date  . CPAP (continuous positive airway pressure) dependence   . Hyperlipidemia   . Hypertension   . Sleep apnea   . Type 2 diabetes mellitus ([place])   . Vitamin D deficiency     Past Surgical History:  Procedure Laterality Date  . ABDOMINAL HYSTERECTOMY    . TONSILLECTOMY    . TOOTH EXTRACTION    . TRIGGER FINGER RELEASE      There were no vitals filed for this visit.   Subjective Assessment - 05/31/20 1447    Subjective  Pt likes modifications to one putty ex and foam tubing for leash    Currently in Pain? Yes    Pain Score 3     Pain Location Hand    Pain Orientation Right    Pain Descriptors / Indicators Aching    Pain Type Chronic pain    Pain Onset More than a month ago    Pain Frequency Intermittent    Aggravating Factors  use    Pain Relieving Factors rest           Ultrasound x 8 min total, mainly to base of ring and small finger volarly for 5 minutes and dorsal forearm x 3 min for extrinsic tightness at 3 Mhz, 20% pulsed, 0.8 wts/cm2.  Reviewed putty HEP - d/c MP flexion ex d/t potentially increasing trigger finger symptoms and modified finger extension and abduction ex's with use of  rubberband (vs. Putty) to increase comfort, ease, and prevent DIP hyperextension. Pt reports this felt much better. Also clarified IP flexion ex and distinguished b/t full grasp. Pt also shown extrinsic extensor stretch.  Issued pre-fab trigger finger splint and instructed to wear prn during the day. Also issued blue foam to place over dog leash to increase handle size and comfort (to wrap foam around leash w/ coban).  Ice massage x 5 min to base of small and ring fingers at end of session to decrease pain/swelling. Pt instructed in how to perform at home                        OT Short Term Goals - 05/15/20 1712      OT SHORT TERM GOAL #2   Title Patient will demonstrate awareness of activity modifications to reduce symptoms in right hand/arm during work, and leisure activities    Status Achieved      OT [place] #3   Title Patient will demonstrate composite fist in right hand (pad to palm) with all four fingers actively to help with functional grasp    Status Achieved  OT SHORT TERM GOAL #4   Title Patient will demonstrate awareness of splint wear and care    Status Achieved             OT Long Term Goals - 05/22/20 1845      OT LONG TERM GOAL #1   Title Patient will complete updated HEP to address both range of motion and strengthening in right hand    Status On-going      OT LONG TERM GOAL #2   Title Patient will report pain no greater than 2/10 when functionally using right hand to carry a mug or glass of liquid 15 feet with right hand    Status Achieved      OT LONG TERM GOAL #3   Title Patient will report effective grip on garden tools, trowel, etc when working in her raised beds (vegetable garden)    Status Achieved      OT LONG TERM GOAL #4   Title Patient will demonstrate at least 7 lb increase in grip strength in right hand to help with ADL/IADL/ leisure activity    Status Achieved   33.2!                Plan - 05/31/20 1449     Clinical Impression Statement Pt tolerating putty ex's today with modifications and w/ use of modalities/stretches    Occupational performance deficits (Please refer to evaluation for details): ADL's;IADL's;Leisure;Rest and Sleep;Work    Marketing executive / Function / Physical Skills ADL;UE functional use;Fascial restriction;Body mechanics;Decreased knowledge of use of DME;Flexibility;IADL;Pain;Strength;Dexterity;Edema;ROM    Rehab Potential Good    Comorbidities impacting occupational performance description: Prior trigger thumb/finger release, DM    OT Frequency 1x / week    OT Duration 4 weeks    OT Treatment/Interventions Self-care/ADL training;Electrical Stimulation;Iontophoresis;Therapeutic exercise;Patient/family education;Splinting;Moist Heat;Fluidtherapy;Therapeutic activities;Passive range of motion;Manual Therapy;DME and/or AE instruction;Contrast Bath;Ultrasound;Cryotherapy    Plan check remaining goals and d/c next session    Consulted and Agree with Plan of Care Patient           Patient will benefit from skilled therapeutic intervention in order to improve the following deficits and impairments:   Body Structure / Function / Physical Skills: ADL, UE functional use, Fascial restriction, Body mechanics, Decreased knowledge of use of DME, Flexibility, IADL, Pain, Strength, Dexterity, Edema, ROM       Visit Diagnosis: Pain in right hand  Stiffness of right hand, not elsewhere classified  Pain in right forearm  Muscle weakness (generalized)    Problem List Patient Active Problem List   Diagnosis Date Noted  . Hyperlipidemia 07/08/2019  . Sleep apnea 07/08/2019  . Essential hypertension 03/02/2019  . Type 2 diabetes mellitus without complication, without long-term current use of insulin ([place]) 03/02/2019  . Acquired hypothyroidism 03/02/2019    Carey Bullocks, OTR/L 05/31/2020, 2:51 PM  [facility] [address] [place], [place], [address] Phone: [phone]   Fax:  [phone]  Name: Melanie Steele MRN: [id number] Date of Birth: 08-30-1961

## 2020-06-05 ENCOUNTER — Other Ambulatory Visit (HOSPITAL_BASED_OUTPATIENT_CLINIC_OR_DEPARTMENT_OTHER): Payer: Self-pay | Admitting: Internal Medicine

## 2020-06-05 DIAGNOSIS — Z1231 Encounter for screening mammogram for malignant neoplasm of breast: Secondary | ICD-10-CM

## 2020-06-06 ENCOUNTER — Other Ambulatory Visit: Payer: Self-pay

## 2020-06-06 ENCOUNTER — Other Ambulatory Visit (INDEPENDENT_AMBULATORY_CARE_PROVIDER_SITE_OTHER): Payer: BC Managed Care – PPO

## 2020-06-06 DIAGNOSIS — E039 Hypothyroidism, unspecified: Secondary | ICD-10-CM | POA: Diagnosis not present

## 2020-06-06 DIAGNOSIS — E119 Type 2 diabetes mellitus without complications: Secondary | ICD-10-CM

## 2020-06-07 LAB — HEMOGLOBIN A1C
Est. average glucose Bld gHb Est-mCnc: 192 mg/dL
Hgb A1c MFr Bld: 8.3 % — ABNORMAL HIGH (ref 4.8–5.6)

## 2020-06-07 LAB — TSH+T4F+T3FREE
Free T4: 0.76 ng/dL — ABNORMAL LOW (ref 0.82–1.77)
T3, Free: 2.4 pg/mL (ref 2.0–4.4)
TSH: 4.92 u[IU]/mL — ABNORMAL HIGH (ref 0.450–4.500)

## 2020-06-14 ENCOUNTER — Other Ambulatory Visit: Payer: Self-pay

## 2020-06-14 ENCOUNTER — Ambulatory Visit: Payer: BC Managed Care – PPO | Attending: Internal Medicine | Admitting: Occupational Therapy

## 2020-06-14 DIAGNOSIS — M79641 Pain in right hand: Secondary | ICD-10-CM

## 2020-06-14 DIAGNOSIS — R600 Localized edema: Secondary | ICD-10-CM | POA: Diagnosis not present

## 2020-06-14 DIAGNOSIS — M79631 Pain in right forearm: Secondary | ICD-10-CM

## 2020-06-14 DIAGNOSIS — M6281 Muscle weakness (generalized): Secondary | ICD-10-CM | POA: Diagnosis not present

## 2020-06-14 DIAGNOSIS — M25641 Stiffness of right hand, not elsewhere classified: Secondary | ICD-10-CM | POA: Diagnosis not present

## 2020-06-14 NOTE — Therapy (Signed)
[facility] [address] [place], [place], [address] Phone: [phone]   Fax:  [phone]  Occupational Therapy Treatment  Patient Details  Name: Melanie Steele MRN: [id number] Date of Birth: [birth year] Referring Provider (OT): Phill Myron   Encounter Date: 06/14/2020   OT End of Session - 06/14/20 1825    Visit Number 11    Number of Visits 11    Authorization Type BCBS- 60 VL - OT/PT/SLP    OT Start Time 6144    OT Stop Time 3154    OT Time Calculation (min) 38 min    Activity Tolerance Patient tolerated treatment well    Behavior During Therapy [facility] for tasks assessed/performed           Past Medical History:  Diagnosis Date  . CPAP (continuous positive airway pressure) dependence   . Hyperlipidemia   . Hypertension   . Sleep apnea   . Type 2 diabetes mellitus ([place])   . Vitamin D deficiency     Past Surgical History:  Procedure Laterality Date  . ABDOMINAL HYSTERECTOMY    . TONSILLECTOMY    . TOOTH EXTRACTION    . TRIGGER FINGER RELEASE      There were no vitals filed for this visit.                 OT Treatments/Exercises (OP) - 06/14/20 0001      ADLs   ADL Comments Reviewed remaining goals, and discussed community exercise options - pool, or yoga to continue to improve/ sustain flexibility reduce stiffness      Ultrasound   Ultrasound Location right palm, ring finger    Ultrasound Parameters 47mz, cont, 0.8w/cm2, 8 min    Ultrasound Goals Pain   stiffness                   OT Short Term Goals - 05/15/20 1712      OT SHORT TERM GOAL #2   Title Patient will demonstrate awareness of activity modifications to reduce symptoms in right hand/arm during work, and leisure activities    Status Achieved      OT [place]#3   Title Patient will demonstrate composite fist in right hand (pad to palm) with all four fingers actively to help with functional grasp     Status Achieved      OT SHORT TERM GOAL #4   Title Patient will demonstrate awareness of splint wear and care    Status Achieved             OT Long Term Goals - 06/14/20 1815      OT LONG TERM GOAL #1   Title Patient will complete updated HEP to address both range of motion and strengthening in right hand    Status Achieved      OT LONG TERM GOAL #2   Title Patient will report pain no greater than 2/10 when functionally using right hand to carry a mug or glass of liquid 15 feet with right hand    Status Achieved      OT LONG TERM GOAL #3   Title Patient will report effective grip on garden tools, trowel, etc when working in her raised beds (vegetable garden)    Status Achieved      OT LONG TERM GOAL #4   Title Patient will demonstrate at least 7 lb increase in grip strength in right hand to help with ADL/IADL/ leisure activity  Status Achieved   33.9                Plan - 06/14/20 1825    Clinical Impression Statement Patient is scheduled to see hand specilaist in the next few weeks as her pain and stiffness although better has not fully resolved with therapy.    OT Frequency 1x / week    OT Duration 4 weeks    OT Treatment/Interventions Self-care/ADL training;Electrical Stimulation;Iontophoresis;Therapeutic exercise;Patient/family education;Splinting;Moist Heat;Fluidtherapy;Therapeutic activities;Passive range of motion;Manual Therapy;DME and/or AE instruction;Contrast Bath;Ultrasound;Cryotherapy    Plan disharge    OT Home Exercise Plan right ring blocking exercise, yellow putty    Consulted and Agree with Plan of Care Patient           Patient will benefit from skilled therapeutic intervention in order to improve the following deficits and impairments:           Visit Diagnosis: Pain in right hand  Stiffness of right hand, not elsewhere classified  Pain in right forearm  Muscle weakness (generalized)  Localized edema    Problem List Patient  Active Problem List   Diagnosis Date Noted  . Hyperlipidemia 07/08/2019  . Sleep apnea 07/08/2019  . Essential hypertension 03/02/2019  . Type 2 diabetes mellitus without complication, without long-term current use of insulin ([place]) 03/02/2019  . Acquired hypothyroidism 03/02/2019   OCCUPATIONAL THERAPY DISCHARGE SUMMARY  Visits from Start of Care:11  Current functional level related to goals / functional outcomes: Improved functional use of dominant right hand   Remaining deficits: Pain/stiffness right hand  Education / Equipment: HEP, activity modification Plan: Patient agrees to discharge.  Patient goals were partially met. Patient is being discharged due to meeting the stated rehab goals.  ?????      Mariah Milling , OTR/L 06/14/2020, 6:27 PM  [facility] [address] [place], [place], [address] Phone: [phone]   Fax:  [phone]  Name: Melanie Steele MRN: [id number] Date of Birth: [birth year]

## 2020-06-15 NOTE — Progress Notes (Signed)
Patient notified of results & recommendations. Expressed understanding.

## 2020-06-26 ENCOUNTER — Telehealth: Payer: BC Managed Care – PPO | Admitting: Internal Medicine

## 2020-07-10 ENCOUNTER — Other Ambulatory Visit: Payer: Self-pay

## 2020-07-10 MED ORDER — FREESTYLE LIBRE SENSOR SYSTEM MISC: 0 refills | Status: DC

## 2020-07-12 DIAGNOSIS — M79641 Pain in right hand: Secondary | ICD-10-CM | POA: Diagnosis not present

## 2020-07-12 DIAGNOSIS — M79642 Pain in left hand: Secondary | ICD-10-CM | POA: Diagnosis not present

## 2020-07-12 DIAGNOSIS — M65341 Trigger finger, right ring finger: Secondary | ICD-10-CM | POA: Diagnosis not present

## 2020-07-12 DIAGNOSIS — M65342 Trigger finger, left ring finger: Secondary | ICD-10-CM | POA: Diagnosis not present

## 2020-07-16 ENCOUNTER — Telehealth: Payer: Self-pay

## 2020-07-16 NOTE — Telephone Encounter (Signed)

## 2020-07-17 ENCOUNTER — Encounter: Payer: Self-pay | Admitting: Internal Medicine

## 2020-07-17 ENCOUNTER — Other Ambulatory Visit: Payer: Self-pay

## 2020-07-17 ENCOUNTER — Ambulatory Visit (INDEPENDENT_AMBULATORY_CARE_PROVIDER_SITE_OTHER): Payer: BC Managed Care – PPO | Admitting: Internal Medicine

## 2020-07-17 VITALS — BP 130/83 | HR 88 | Temp 97.3°F | Resp 17 | Wt 216.0 lb

## 2020-07-17 DIAGNOSIS — E039 Hypothyroidism, unspecified: Secondary | ICD-10-CM | POA: Diagnosis not present

## 2020-07-17 DIAGNOSIS — E1169 Type 2 diabetes mellitus with other specified complication: Secondary | ICD-10-CM

## 2020-07-17 DIAGNOSIS — E785 Hyperlipidemia, unspecified: Secondary | ICD-10-CM | POA: Diagnosis not present

## 2020-07-17 DIAGNOSIS — E1149 Type 2 diabetes mellitus with other diabetic neurological complication: Secondary | ICD-10-CM

## 2020-07-17 DIAGNOSIS — I10 Essential (primary) hypertension: Secondary | ICD-10-CM | POA: Diagnosis not present

## 2020-07-17 DIAGNOSIS — Z1211 Encounter for screening for malignant neoplasm of colon: Secondary | ICD-10-CM

## 2020-07-17 LAB — POCT GLYCOSYLATED HEMOGLOBIN (HGB A1C): Hemoglobin A1C: 9.8 % — AB (ref 4.0–5.6)

## 2020-07-17 LAB — GLUCOSE, POCT (MANUAL RESULT ENTRY): POC Glucose: 179 mg/dl — AB (ref 70–99)

## 2020-07-17 MED ORDER — CANAGLIFLOZIN 300 MG PO TABS: 300.0000 mg | ORAL_TABLET | Freq: Every day | ORAL | 1 refills | Status: DC

## 2020-07-17 MED ORDER — TRULICITY 1.5 MG/0.5ML ~~LOC~~ SOAJ: 1.5000 mg | SUBCUTANEOUS | 2 refills | Status: DC

## 2020-07-17 MED ORDER — METOPROLOL SUCCINATE ER 50 MG PO TB24: 50.0000 mg | ORAL_TABLET | Freq: Every day | ORAL | 1 refills | Status: DC

## 2020-07-17 MED ORDER — ROSUVASTATIN CALCIUM 5 MG PO TABS: 5.0000 mg | ORAL_TABLET | ORAL | 1 refills | Status: DC

## 2020-07-17 MED ORDER — AMLODIPINE BESYLATE 10 MG PO TABS: 10.0000 mg | ORAL_TABLET | Freq: Every day | ORAL | 1 refills | Status: DC

## 2020-07-17 MED ORDER — POTASSIUM CHLORIDE CRYS ER 20 MEQ PO TBCR: 20.0000 meq | EXTENDED_RELEASE_TABLET | Freq: Every day | ORAL | 1 refills | Status: DC

## 2020-07-17 MED ORDER — THYROID 180 MG PO TABS: 180.0000 mg | ORAL_TABLET | Freq: Every day | ORAL | 0 refills | Status: DC

## 2020-07-17 MED ORDER — HYDROCHLOROTHIAZIDE 25 MG PO TABS: 25.0000 mg | ORAL_TABLET | ORAL | 1 refills | Status: DC

## 2020-07-17 NOTE — Progress Notes (Signed)
Subjective:    Melanie Steele - 59 y.o. female MRN [id number]  Date of birth: 1961/05/07  HPI  Zamani Crocker is here for follow up of chronic medical conditions.  Diabetes mellitus, Type 2 Disease Monitoring             Blood Sugar Ranges: Fasting - Was up to 400s last week due to two steroid injections done in her hands for trigger finger still looking post operatively. Prior to the steroid injection, reports fasting CBGs were mostly around 160-180s.              Polyuria: no              Visual problems: no   Urine Microalbumin <9 (July 2020) ---not on Ace/Arb due to history of anaphylaxis with Lisinopril   Last A1C: 8.3 (June 2021)   Medications:  Trulicity 3.71 mg weekly, Invokana 300 mg  Medication Compliance: yes  Medication Side Effects             Hypoglycemia: no    Chronic HTN Chest pain- no  Dyspnea- no Headache - no  Medications: Amlodipine 10 mg, HCTZ 25 mg, Metoprolol XL 50 mg  Compliance- yes Lightheadedness- no  Edema- no   Hypothyroidism: Reports she has been on Synthroid in the past and had her dose adjusted many times. Eventually was switched to Armour Thyroid and has also had that dose changed. Says thyroid levels have been very difficult to control over the years. Sometimes very high and sometimes very low.     Health Maintenance:  Health Maintenance Due  Topic Date Due  . COVID-19 Vaccine (1) Never done  . COLONOSCOPY  Never done  . OPHTHALMOLOGY EXAM  06/15/2018  . URINE MICROALBUMIN  07/11/2020  . INFLUENZA VACCINE  07/15/2020    -  reports that she has never smoked. She has never used smokeless tobacco. - Review of Systems: Per HPI. - Past Medical History: Patient Active Problem List   Diagnosis Date Noted  . Hyperlipidemia 07/08/2019  . Sleep apnea 07/08/2019  . Essential hypertension 03/02/2019  . Type 2 diabetes mellitus without complication, without long-term current use of insulin ([place]) 03/02/2019  . Acquired  hypothyroidism 03/02/2019   - Medications: reviewed and updated   Objective:   Physical Exam BP 130/83   Pulse 88   Temp (!) 97.3 F (36.3 C) (Temporal)   Resp 17   Wt 216 lb (98 kg)   SpO2 96%   BMI 35.94 kg/m  Physical Exam Constitutional:      General: She is not in acute distress.    Appearance: She is not diaphoretic.  HENT:     Head: Normocephalic and atraumatic.  Eyes:     Conjunctiva/sclera: Conjunctivae normal.  Cardiovascular:     Rate and Rhythm: Normal rate and regular rhythm.     Heart sounds: Normal heart sounds. No murmur heard.   Pulmonary:     Effort: Pulmonary effort is normal. No respiratory distress.     Breath sounds: Normal breath sounds.  Musculoskeletal:        General: Normal range of motion.  Skin:    General: Skin is warm and dry.  Neurological:     Mental Status: She is alert and oriented to person, place, and time.  Psychiatric:        Mood and Affect: Affect normal.        Judgment: Judgment normal.            Assessment &  Plan:   1. Type 2 diabetes mellitus with other neurologic complication, without long-term current use of insulin (HCC) A1c not well controlled with result of 9.8. She has had increased CBGs related to steroid injections. However, prior A1c trend has been elevated and fasting CBGs were not at goal before steroid injections. Increase Trulicity to 1.5 mg weekly.  Counseled on Diabetic diet, my plate method, 030 minutes of moderate intensity exercise/week Blood sugar logs with fasting goals of 80-120 mg/dl, random of less than 180 and in the event of sugars less than 60 mg/dl or greater than 400 mg/dl encouraged to notify the clinic. Advised on the need for annual eye exams, annual foot exams, Pneumonia vaccine. - Glucose (CBG) - HgB A1c - Microalbumin/Creatinine Ratio, Urine - Ambulatory referral to Ophthalmology - Lipid Panel - Ambulatory referral to Endocrinology - canagliflozin (INVOKANA) 300 MG TABS tablet;  Take 1 tablet (300 mg total) by mouth daily.  Dispense: 90 tablet; Refill: 1 - Dulaglutide (TRULICITY) 1.5 DT/1.4HO SOPN; Inject 0.5 mLs (1.5 mg total) into the skin once a week.  Dispense: 2 mL; Refill: 2  2. Essential hypertension BP at goal. Continue current regimen.  Counseled on blood pressure goal of less than 130/80, low-sodium, DASH diet, medication compliance, 150 minutes of moderate intensity exercise per week. Discussed medication compliance, adverse effects. - amLODipine (NORVASC) 10 MG tablet; Take 1 tablet (10 mg total) by mouth daily.  Dispense: 90 tablet; Refill: 1 - hydrochlorothiazide (HYDRODIURIL) 25 MG tablet; Take 1 tablet (25 mg total) by mouth every other day.  Dispense: 45 tablet; Refill: 1 - metoprolol succinate (TOPROL-XL) 50 MG 24 hr tablet; Take 1 tablet (50 mg total) by mouth daily.  Dispense: 90 tablet; Refill: 1 - potassium chloride SA (KLOR-CON) 20 MEQ tablet; Take 1 tablet (20 mEq total) by mouth daily.  Dispense: 90 tablet; Refill: 1  3. Hyperlipidemia associated with type 2 diabetes mellitus (HCC) - Lipid Panel - rosuvastatin (CRESTOR) 5 MG tablet; Take 1 tablet (5 mg total) by mouth every other day.  Dispense: 45 tablet; Refill: 1  4. Acquired hypothyroidism Given report of labile thyroid function, will refer to endocrinology as opposed to converting to Synthroid here.  - Ambulatory referral to Endocrinology - thyroid (ARMOUR THYROID) 180 MG tablet; Take 1 tablet (180 mg total) by mouth daily.  Dispense: 90 tablet; Refill: 0  5. Colon cancer screening - Ambulatory referral to Gastroenterology    Phill Myron, D.O. 07/17/2020, 8:55 AM Primary Care at [facility]

## 2020-07-18 LAB — MICROALBUMIN / CREATININE URINE RATIO
Creatinine, Urine: 43.8 mg/dL
Microalb/Creat Ratio: 12 mg/g creat (ref 0–29)
Microalbumin, Urine: 5.2 ug/mL

## 2020-07-18 LAB — LIPID PANEL
Chol/HDL Ratio: 3.3 ratio (ref 0.0–4.4)
Cholesterol, Total: 188 mg/dL (ref 100–199)
HDL: 57 mg/dL (ref >39)
LDL Chol Calc (NIH): 111 mg/dL — ABNORMAL HIGH (ref 0–99)
Triglycerides: 115 mg/dL (ref 0–149)
VLDL Cholesterol Cal: 20 mg/dL (ref 5–40)

## 2020-07-20 ENCOUNTER — Ambulatory Visit (HOSPITAL_BASED_OUTPATIENT_CLINIC_OR_DEPARTMENT_OTHER)
Admission: RE | Admit: 2020-07-20 | Discharge: 2020-07-20 | Disposition: A | Discharge status: Home / Self Care | Payer: BC Managed Care – PPO | Source: Ambulatory Visit | Attending: Internal Medicine | Admitting: Internal Medicine

## 2020-07-20 ENCOUNTER — Other Ambulatory Visit: Payer: Self-pay

## 2020-07-20 DIAGNOSIS — Z1231 Encounter for screening mammogram for malignant neoplasm of breast: Secondary | ICD-10-CM

## 2020-07-25 DIAGNOSIS — G4733 Obstructive sleep apnea (adult) (pediatric): Secondary | ICD-10-CM | POA: Diagnosis not present

## 2020-07-26 ENCOUNTER — Other Ambulatory Visit: Payer: Self-pay

## 2020-07-26 ENCOUNTER — Emergency Department (HOSPITAL_BASED_OUTPATIENT_CLINIC_OR_DEPARTMENT_OTHER)
Admission: EM | Admit: 2020-07-26 | Discharge: 2020-07-26 | Discharge status: Against Medical Advice | Payer: BC Managed Care – PPO | Attending: Emergency Medicine | Admitting: Emergency Medicine

## 2020-07-26 ENCOUNTER — Other Ambulatory Visit: Payer: Self-pay | Admitting: Internal Medicine

## 2020-07-26 ENCOUNTER — Encounter (HOSPITAL_BASED_OUTPATIENT_CLINIC_OR_DEPARTMENT_OTHER): Payer: Self-pay

## 2020-07-26 DIAGNOSIS — Z79899 Other long term (current) drug therapy: Secondary | ICD-10-CM | POA: Diagnosis not present

## 2020-07-26 DIAGNOSIS — I10 Essential (primary) hypertension: Secondary | ICD-10-CM | POA: Insufficient documentation

## 2020-07-26 DIAGNOSIS — E119 Type 2 diabetes mellitus without complications: Secondary | ICD-10-CM | POA: Diagnosis not present

## 2020-07-26 DIAGNOSIS — T781XXA Other adverse food reactions, not elsewhere classified, initial encounter: Secondary | ICD-10-CM | POA: Diagnosis not present

## 2020-07-26 DIAGNOSIS — E039 Hypothyroidism, unspecified: Secondary | ICD-10-CM | POA: Insufficient documentation

## 2020-07-26 DIAGNOSIS — T7840XA Allergy, unspecified, initial encounter: Secondary | ICD-10-CM | POA: Diagnosis not present

## 2020-07-26 LAB — CBG MONITORING, ED: Glucose-Capillary: 157 mg/dL — ABNORMAL HIGH (ref 70–99)

## 2020-07-26 MED ORDER — EPINEPHRINE 0.3 MG/0.3ML IJ SOAJ: INTRAMUSCULAR | 1 refills | Status: DC

## 2020-07-26 NOTE — Discharge Instructions (Signed)
Please return to the emergency department if you develop new or worsening symptoms.

## 2020-07-26 NOTE — ED Triage Notes (Signed)
Pt reports "feel like my throat is closing" r/t allergic reaction ~930am-states she took benadryl x 4-did not take epi pen-NAD-steady gait

## 2020-07-26 NOTE — ED Provider Notes (Signed)
[facility] EMERGENCY DEPARTMENT Provider Note   CSN: [id number] Arrival date & time: 07/26/20  1128     History Chief Complaint  Patient presents with  . Allergic Reaction    Melanie Steele is a 59 y.o. female.  HPI Patient is a 59 year old female with a medical history as noted below.  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  She has an EpiPen at home which she has never used.  She states that in the past that when she has allergic reactions she will come to the emergency department and will be monitored for "a few hours" and will be given steroids and will be monitored.  She states about 4 hours ago she was eating "a sticky bun" that she believes had tree nuts on it.  She reports some mild "throat tightness" that she states is typical for her allergic reaction.  Her symptoms have been constant.  She denies that they have been worsening.  She has no other complaints at this time.  She denies fevers, chills, drooling, shortness of breath, chest pain, angioedema, leg swelling.    Past Medical History:  Diagnosis Date  . CPAP (continuous positive airway pressure) dependence   . Hyperlipidemia   . Hypertension   . Sleep apnea   . Type 2 diabetes mellitus ([place])   . Vitamin D deficiency     Patient Active Problem List   Diagnosis Date Noted  . Hyperlipidemia 07/08/2019  . Sleep apnea 07/08/2019  . Essential hypertension 03/02/2019  . Type 2 diabetes mellitus without complication, without long-term current use of insulin ([place]) 03/02/2019  . Acquired hypothyroidism 03/02/2019    Past Surgical History:  Procedure Laterality Date  . ABDOMINAL HYSTERECTOMY    . TONSILLECTOMY    . TOOTH EXTRACTION    . TRIGGER FINGER RELEASE       OB History   No obstetric history on file.     Family History  Problem Relation Age of Onset  . Diabetes Mother   . Hypertension Mother   . Stroke Father   . Hypertension Sister     Social History    Tobacco Use  . Smoking status: Never Smoker  . Smokeless tobacco: Never Used  Vaping Use  . Vaping Use: Never used  Substance Use Topics  . Alcohol use: Yes    Comment: occ  . Drug use: Never    Home Medications Prior to Admission medications   Medication Sig Start Date End Date Taking? Authorizing Provider  amLODipine (NORVASC) 10 MG tablet Take 1 tablet (10 mg total) by mouth daily. 07/17/20   Nicolette Bang, DO  canagliflozin (INVOKANA) 300 MG TABS tablet Take 1 tablet (300 mg total) by mouth daily. 07/17/20   Nicolette Bang, DO  Continuous Blood Gluc Sensor ([place]) MISC Apply one sensor every 14 days. Dx: E11.49 07/10/20   Nicolette Bang, DO  Dulaglutide (TRULICITY) 1.5 WC/5.8NI SOPN Inject 0.5 mLs (1.5 mg total) into the skin once a week. 07/17/20   Nicolette Bang, DO  EPINEPHrine 0.3 mg/0.3 mL IJ SOAJ injection epinephrine 0.3 mg/0.3 mL injection, auto-injector  Inject 0.3 mg IM as needed 11/05/18   [provider]  fexofenadine (ALLEGRA) 180 MG tablet Take 1 tablet by mouth every other day.     [provider]  fluticasone (FLONASE) 50 MCG/ACT nasal spray Place into both nostrils 2 (two) times daily.    [provider]  gabapentin (  NEURONTIN) 100 MG capsule Take 1 capsule (100 mg total) by mouth 3 (three) times daily as needed. 10/13/19   Charlott Rakes, MD  hydrochlorothiazide (HYDRODIURIL) 25 MG tablet Take 1 tablet (25 mg total) by mouth every other day. 07/17/20   Nicolette Bang, DO  metoprolol succinate (TOPROL-XL) 50 MG 24 hr tablet Take 1 tablet (50 mg total) by mouth daily. 07/17/20   Nicolette Bang, DO  Multiple Vitamin (MULTIVITAMIN) tablet Take 1 tablet by mouth daily.    [provider]  potassium chloride SA (KLOR-CON) 20 MEQ tablet Take 1 tablet (20 mEq total) by mouth daily. 07/17/20   Nicolette Bang, DO  rosuvastatin (CRESTOR) 5 MG tablet  Take 1 tablet (5 mg total) by mouth every other day. 07/17/20   Nicolette Bang, DO  thyroid (ARMOUR THYROID) 180 MG tablet Take 1 tablet (180 mg total) by mouth daily. 07/17/20   Nicolette Bang, DO    Allergies    Lisinopril, Other, Linagliptin, and Metformin and related  Review of Systems   Review of Systems  All other systems reviewed and are negative. Ten systems reviewed and are negative for acute change, except as noted in the HPI.   Physical Exam Updated Vital Signs BP (!) 170/94 (BP Location: Left Arm)   Pulse 97   Temp 98.6 F (37 C) (Oral)   Resp 18   SpO2 98%   Physical Exam Vitals and nursing note reviewed.  Constitutional:      General: She is not in acute distress.    Appearance: Normal appearance. She is not ill-appearing, toxic-appearing or diaphoretic.  HENT:     Head: Normocephalic and atraumatic.     Comments: No angioedema appreciated    Right Ear: External ear normal.     Left Ear: External ear normal.     Nose: Nose normal.     Mouth/Throat:     Mouth: Mucous membranes are moist.     Pharynx: Oropharynx is clear. No oropharyngeal exudate or posterior oropharyngeal erythema.     Comments: No drooling.  Uvula midline.  No lingual edema. Eyes:     General: No scleral icterus.       Right eye: No discharge.        Left eye: No discharge.     Extraocular Movements: Extraocular movements intact.     Conjunctiva/sclera: Conjunctivae normal.  Cardiovascular:     Rate and Rhythm: Normal rate and regular rhythm.     Pulses: Normal pulses.     Heart sounds: Normal heart sounds. No murmur heard.  No friction rub. No gallop.   Pulmonary:     Effort: Pulmonary effort is normal. No respiratory distress.     Breath sounds: Normal breath sounds. No stridor. No wheezing, rhonchi or rales.     Comments: No wheezing appreciated in the posterior lung fields bilaterally.  Patient is nonstridorous.  No respiratory distress.  She is phonating and  clearing complete sentences. Abdominal:     General: Abdomen is flat.     Tenderness: There is no abdominal tenderness.  Musculoskeletal:        General: Normal range of motion.     Cervical back: Normal range of motion and neck supple. No tenderness.  Skin:    General: Skin is warm and dry.  Neurological:     General: No focal deficit present.     Mental Status: She is alert and oriented to person, place, and time.  Psychiatric:  Mood and Affect: Mood normal.        Behavior: Behavior normal.     ED Results / Procedures / Treatments   Labs (all labs ordered are listed, but only abnormal results are displayed) Labs Reviewed  CBG MONITORING, ED   EKG None  Radiology No results found.  Procedures Procedures (including critical care time)  Medications Ordered in ED Medications - No data to display  ED Course  I have reviewed the triage vital signs and the nursing notes.  Pertinent labs & imaging results that were available during my care of the patient were reviewed by me and considered in my medical decision making (see chart for details).    MDM Rules/Calculators/A&P                          Pt is a 59 y.o. female that present with a history, physical exam, ED Clinical Course as noted above.   Patient presents today due to an allergic reaction.  She states that "she has been having throat tightness" and denies any other complaints at this time.  Her physical exam is reassuring.  She appears to be readily handling secretions, nonstridorous, not wheezing.  She does not appear to be short of breath and does not complain of any chest tightness or shortness of breath.  No systemic symptoms at this time.  No nausea or vomiting.  She is not hypotensive.  She notes a history of anaphylaxis but has never required an EpiPen.  She states that she is typically monitored in the emergency department for a few hours and ultimately discharged.  She has a history of diabetes so  we were going to check her blood sugar prior to administering Solu-Medrol.  She has already had 100 mg of Benadryl PTA.  While having her CBG checked she asked the nursing staff if she can be moved to a room since she is currently in a hall bed.  There are currently no rooms available.  Patient states that due to this she would like to go home and have her daughter monitor and bring her back to the emergency department if she feels worse.  I was notified by the nursing staff and spoke to the patient.  I urged her to stay in the emergency department but she would prefer to leave at this time.  We discussed the risks associated with this and she is choosing to leave [facility].  I again confirmed the patient's intentions to leave [facility].  She understands to return to the ED if she develops new or worsening symptoms.  Note: Portions of this report may have been transcribed using voice recognition software. Every effort was made to ensure accuracy; however, inadvertent computerized transcription errors may be present.   Final Clinical Impression(s) / ED Diagnoses Final diagnoses:  Allergic reaction, initial encounter   Rx / DC Orders ED Discharge Orders    None       Rayna Sexton, PA-C 07/26/20 1246    Charlesetta Shanks, MD 08/24/20 [phone]

## 2020-08-17 ENCOUNTER — Other Ambulatory Visit: Payer: Self-pay | Admitting: Internal Medicine

## 2020-08-17 DIAGNOSIS — I10 Essential (primary) hypertension: Secondary | ICD-10-CM

## 2020-08-21 ENCOUNTER — Other Ambulatory Visit: Payer: Self-pay

## 2020-08-21 ENCOUNTER — Ambulatory Visit (INDEPENDENT_AMBULATORY_CARE_PROVIDER_SITE_OTHER): Payer: BC Managed Care – PPO | Admitting: Internal Medicine

## 2020-08-21 ENCOUNTER — Encounter: Payer: Self-pay | Admitting: Internal Medicine

## 2020-08-21 VITALS — BP 150/80 | HR 92 | Ht 65.0 in | Wt 222.2 lb

## 2020-08-21 DIAGNOSIS — E785 Hyperlipidemia, unspecified: Secondary | ICD-10-CM | POA: Diagnosis not present

## 2020-08-21 DIAGNOSIS — E1169 Type 2 diabetes mellitus with other specified complication: Secondary | ICD-10-CM

## 2020-08-21 DIAGNOSIS — E890 Postprocedural hypothyroidism: Secondary | ICD-10-CM | POA: Diagnosis not present

## 2020-08-21 DIAGNOSIS — E1165 Type 2 diabetes mellitus with hyperglycemia: Secondary | ICD-10-CM

## 2020-08-21 MED ORDER — ROSUVASTATIN CALCIUM 5 MG PO TABS: 5.0000 mg | ORAL_TABLET | Freq: Every day | ORAL | 3 refills | Status: DC

## 2020-08-21 MED ORDER — LEVOTHYROXINE SODIUM 150 MCG PO TABS: 150.0000 ug | ORAL_TABLET | Freq: Every day | ORAL | 3 refills | Status: DC

## 2020-08-21 MED ORDER — GLIPIZIDE 5 MG PO TABS: 5.0000 mg | ORAL_TABLET | Freq: Every day | ORAL | 1 refills | Status: DC

## 2020-08-21 NOTE — Patient Instructions (Addendum)
-   Start Glipizide 5 mg daily before breakfast  - Continue Invokana 300 mg daily with Breakfast  - Continue Trulicity 1.5 mg weekly   - Stop Armour thyroid  - Start Levothyroxine 150 mcg daily     You are on levothyroxine - which is your thyroid hormone supplement. You MUST take this consistently.  You should take this first thing in the morning on an empty stomach with water. You should not take it with other medications. Wait 48min to 1hr prior to eating. If you are taking any vitamins - please take these in the evening.   If you miss a dose, please take your missed dose the following day (double the dose for that day). You should have a pill box for ONLY levothyroxine on your bedside table to help you remember to take your medications.     Choose healthy, lower carb lower calorie snacks: toss salad, vegetables, cottage cheese, peanut butter, low fat cheese / string cheese, lower sodium deli meat, tuna salad or chicken salad       HOW TO TREAT LOW BLOOD SUGARS (Blood sugar LESS THAN 70 MG/DL)  Please follow the RULE OF 15 for the treatment of hypoglycemia treatment (when your (blood sugars are less than 70 mg/dL)    STEP 1: Take 15 grams of carbohydrates when your blood sugar is low, which includes:   3-4 GLUCOSE TABS  OR  3-4 OZ OF JUICE OR REGULAR SODA OR  ONE TUBE OF GLUCOSE GEL     STEP 2: RECHECK blood sugar in 15 MINUTES STEP 3: If your blood sugar is still low at the 15 minute recheck --> then, go back to STEP 1 and treat AGAIN with another 15 grams of carbohydrates.

## 2020-08-21 NOTE — Progress Notes (Signed)
Name: Melanie Steele  MRN/ DOB: [id number], [birth year]   Age/ Sex: 59 y.o., female    PCP: Nicolette Bang, DO   Reason for Endocrinology Evaluation: Type 2 Diabetes Mellitus     Date of Initial Endocrinology Visit: 08/21/2020     PATIENT IDENTIFIER: Melanie Steele is a 59 y.o. female with a past medical history of T2DM, OSA, HTN and Dyslipidemia. The patient presented for initial endocrinology clinic visit on 08/21/2020 for consultative assistance with her diabetes management.    HPI: Melanie Steele was    Diagnosed with DM years ago  Prior Medications tried/Intolerance: Metformin-  Tearing stomach .  Currently checking blood sugars multiple times a day through CGM Hypoglycemia episodes : no       Hemoglobin A1c has ranged from 7.9% in 2020, peaking at 9.8% in 2021. Patient required assistance for hypoglycemia: no  Patient has required hospitalization within the last 1 year from hyper or hypoglycemia: no   In terms of diet, the patient eats 3 meals, snacks twice a day, avoids sugar- sweetened beverages.     Has been getting steroid injections for trigger fingers.     THYROID HISTORY: Has been diagnosed with hyperthyroidism in 1993 secondary to Graves' disease . She is S/P RAI ablation, followed by LT- 4 replacement. She was on levothyroxine initially and switched to armour a few years ago ( ~ 2019) . She has been having issues with adjusting her levels.    Father with thyroid disease   HOME DIABETES REGIMEN: Invokana 579 mg daily  Trulicity 1.5 mg weekly - increased in 07/2020 ( Sundays)    Statin:Yes ACE-I/ARB: No Prior Diabetic Education: yes    CONTINUOUS GLUCOSE MONITORING RECORD INTERPRETATION    Dates of Recording: 8/24-08/20/2020  Sensor description:freestyle libre  Results statistics:   CGM use % of time 21  Average and SD 170/27.6  Time in range     64    %  % Time Above 180 28  % Time above 250 8  % Time Below target 0     Glycemic patterns  summary: Limited data   Hyperglycemic episodes  Post prandial   Hypoglycemic episodes occurred  Overnight periods: within goal       DIABETIC COMPLICATIONS: Microvascular complications:    Denies: CKD , retinopathy, neuropathy    Last eye exam: Completed 2020  Macrovascular complications:    Denies: CAD, PVD, CVA   PAST HISTORY: Past Medical History:  Past Medical History:  Diagnosis Date  . CPAP (continuous positive airway pressure) dependence   . Hyperlipidemia   . Hypertension   . Sleep apnea   . Type 2 diabetes mellitus ([place])   . Vitamin D deficiency    Past Surgical History:  Past Surgical History:  Procedure Laterality Date  . ABDOMINAL HYSTERECTOMY    . TONSILLECTOMY    . TOOTH EXTRACTION    . TRIGGER FINGER RELEASE        Social History:  reports that she has never smoked. She has never used smokeless tobacco. She reports current alcohol use. She reports that she does not use drugs. Family History:  Family History  Problem Relation Age of Onset  . Diabetes Mother   . Hypertension Mother   . Stroke Father   . Hypertension Sister      HOME MEDICATIONS: Allergies as of 08/21/2020      Reactions   Lisinopril Anaphylaxis   Other Anaphylaxis   Tree nuts   Linagliptin Other (See Comments)  Upset stomach   Metformin And Related Diarrhea      Medication List       Accurate as of August 21, 2020  4:19 PM. If you have any questions, ask your nurse or doctor.        STOP taking these medications   thyroid 180 MG tablet Commonly known as: Armour Thyroid Stopped by: Dorita Sciara, MD     TAKE these medications   amLODipine 10 MG tablet Commonly known as: NORVASC Take 1 tablet (10 mg total) by mouth daily.   canagliflozin 300 MG Tabs tablet Commonly known as: Invokana Take 1 tablet (300 mg total) by mouth daily.   EPINEPHrine 0.3 mg/0.3 mL Soaj injection Commonly known as: EPI-PEN Inject 0.3 mg IM as needed for allergic  reaction   fexofenadine 180 MG tablet Commonly known as: ALLEGRA Take 1 tablet by mouth every other day.   fluticasone 50 MCG/ACT nasal spray Commonly known as: FLONASE Place into both nostrils 2 (two) times daily.   FreeStyle [employer] Apply one sensor every 14 days. Dx: E11.49   gabapentin 100 MG capsule Commonly known as: NEURONTIN Take 1 capsule (100 mg total) by mouth 3 (three) times daily as needed.   glipiZIDE 5 MG tablet Commonly known as: GLUCOTROL Take 1 tablet (5 mg total) by mouth daily before breakfast. Started by: Dorita Sciara, MD   hydrochlorothiazide 25 MG tablet Commonly known as: HYDRODIURIL Take 1 tablet (25 mg total) by mouth every other day.   levothyroxine 150 MCG tablet Commonly known as: SYNTHROID Take 1 tablet (150 mcg total) by mouth daily. Started by: Dorita Sciara, MD   metoprolol succinate 50 MG 24 hr tablet Commonly known as: TOPROL-XL Take 1 tablet (50 mg total) by mouth daily.   multivitamin tablet Take 1 tablet by mouth daily.   potassium chloride SA 20 MEQ tablet Commonly known as: KLOR-CON Take 1 tablet (20 mEq total) by mouth daily.   rosuvastatin 5 MG tablet Commonly known as: CRESTOR Take 1 tablet (5 mg total) by mouth daily. What changed: when to take this Changed by: Dorita Sciara, MD   Trulicity 1.5 IW/5.8KD Sopn Generic drug: Dulaglutide Inject 0.5 mLs (1.5 mg total) into the skin once a week.        ALLERGIES: Allergies  Allergen Reactions  . Lisinopril Anaphylaxis  . Other Anaphylaxis    Tree nuts  . Linagliptin Other (See Comments)    Upset stomach  . Metformin And Related Diarrhea     REVIEW OF SYSTEMS: A comprehensive ROS was conducted with the patient and is negative except as per HPI and below:  ROS    OBJECTIVE:   VITAL SIGNS: BP (!) 150/80 (BP Location: Right Arm, Patient Position: Sitting, Cuff Size: Large)   Pulse 92   Ht 5\' 5"  (1.651 m)   Wt 222 lb  3.2 oz (100.8 kg)   SpO2 94%   BMI 36.98 kg/m    PHYSICAL EXAM:  General: Melanie Steele appears well and is in NAD  Neck: General: Supple without adenopathy or carotid bruits. Thyroid: Thyroid size normal.  No goiter or nodules appreciated. No thyroid bruit.  Lungs: Clear with good BS bilat with no rales, rhonchi, or wheezes  Heart: RRR with normal S1 and S2 and no gallops; no murmurs; no rub  Abdomen: Normoactive bowel sounds, soft, nontender, without masses or organomegaly palpable  Extremities:  Lower extremities - No pretibial edema. No lesions.  Skin: Normal texture  and temperature to palpation  Neuro: MS is good with appropriate affect, Melanie Steele is alert and Ox3    DM foot exam: 08/21/2020  The skin of the feet is intact without sores or ulcerations. The pedal pulses are 2+ on right and 2+ on left. The sensation is intact to a screening 5.07, 10 gram monofilament bilaterally   DATA REVIEWED:  Lab Results  Component Value Date   HGBA1C 9.8 (A) 07/17/2020   HGBA1C 8.3 (H) 06/06/2020   HGBA1C 8.4 (H) 10/13/2019   Lab Results  Component Value Date   LDLCALC 111 (H) 07/17/2020   CREATININE 0.64 04/16/2020   Lab Results  Component Value Date   MICRALBCREAT 12 07/17/2020    Lab Results  Component Value Date   CHOL 188 07/17/2020   HDL 57 07/17/2020   LDLCALC 111 (H) 07/17/2020   TRIG 115 07/17/2020   CHOLHDL 3.3 07/17/2020        ASSESSMENT / PLAN / RECOMMENDATIONS:   1) Type 2 Diabetes Mellitus, Poorly controlled, Without complications - Most recent A1c of 9.8 %. Goal A1c < 7.0 %.      GENERAL: I have discussed with the patient the pathophysiology of diabetes. We went over the natural progression of the disease. We talked about both insulin resistance and insulin deficiency. We stressed the importance of lifestyle changes including diet and exercise. I explained the complications associated with diabetes including retinopathy, nephropathy, neuropathy as well as increased risk  of cardiovascular disease. We went over the benefit seen with glycemic control.    I explained to the patient that diabetic patients are at higher than normal risk for amputations.    MEDICATIONS: - Start Glipizide 5 mg daily before breakfast  - Continue Invokana 300 mg daily with Breakfast  - Continue Trulicity 1.5 mg weekly   EDUCATION / INSTRUCTIONS:  BG monitoring instructions: Patient is instructed to check her blood sugars 1 times a day, fasting  Call [facility] if: BG persistently < 70  . I reviewed the Rule of 15 for the treatment of hypoglycemia in detail with the patient. Literature supplied.   2) Diabetic complications:   Eye: Does not have known diabetic retinopathy.   Neuro/ Feet: Does not have known diabetic peripheral neuropathy.  Renal: Patient does not have known baseline CKD. She is not on an ACEI/ARB at present.   3) Dyslipidemia: Patient is on rosuvastatin every other day, LDL above goal at 111 mg/dL . Discussed cardiovascular benefits of statins . I have advised her to take this daily.     Medication:  Rosuvastatin 5 mg daily   4) Postablative Hypothyroidism:   - No local neck symptoms  -  I have advised the patient for my preference to go with Levothyroxine rather then armour thyroid, due to more stability with T4 content in levothyroxine and also armour thyroid has non-physiologic levels of T4:T3 of  4:1 (physiologic levels 13:1 to 16:1) -- Melanie Steele educated extensively on the correct way to take levothyroxine (first thing in the morning with water, 30 minutes before eating or taking other medications). - Melanie Steele encouraged to double dose the following day if she were to miss a dose given long half-life of levothyroxine.   Medication:  Stop Armour thyroid  Start Levothyroxine 150 mcg daily     F/U in 3 months   Signed electronically by: Mack Guise, MD  [facility]  [facility] [address], [place], [place] [address] Phone:  [phone] FAX: [phone]   CC: Nicolette Bang, DO [place] [address] Phone: [phone]  Fax: [phone]    Return to Endocrinology clinic as below: Future Appointments  Date Time Provider [place]  10/17/2020  8:30 AM Nicolette Bang, DO [facility] None

## 2020-08-22 DIAGNOSIS — E119 Type 2 diabetes mellitus without complications: Secondary | ICD-10-CM | POA: Insufficient documentation

## 2020-08-22 DIAGNOSIS — E890 Postprocedural hypothyroidism: Secondary | ICD-10-CM | POA: Insufficient documentation

## 2020-08-22 DIAGNOSIS — E785 Hyperlipidemia, unspecified: Secondary | ICD-10-CM | POA: Insufficient documentation

## 2020-08-22 DIAGNOSIS — E1165 Type 2 diabetes mellitus with hyperglycemia: Secondary | ICD-10-CM | POA: Insufficient documentation

## 2020-09-14 LAB — HM DIABETES EYE EXAM

## 2020-09-17 DIAGNOSIS — E1149 Type 2 diabetes mellitus with other diabetic neurological complication: Secondary | ICD-10-CM

## 2020-09-17 MED ORDER — TRULICITY 1.5 MG/0.5ML ~~LOC~~ SOAJ: 1.5000 mg | SUBCUTANEOUS | 0 refills | Status: DC

## 2020-10-17 ENCOUNTER — Ambulatory Visit: Payer: BC Managed Care – PPO | Admitting: Internal Medicine

## 2020-10-22 ENCOUNTER — Other Ambulatory Visit: Payer: Self-pay

## 2020-10-22 ENCOUNTER — Encounter: Payer: Self-pay | Admitting: Internal Medicine

## 2020-10-22 ENCOUNTER — Telehealth (INDEPENDENT_AMBULATORY_CARE_PROVIDER_SITE_OTHER): Payer: BC Managed Care – PPO | Admitting: Internal Medicine

## 2020-10-22 DIAGNOSIS — E890 Postprocedural hypothyroidism: Secondary | ICD-10-CM | POA: Diagnosis not present

## 2020-10-22 DIAGNOSIS — I10 Essential (primary) hypertension: Secondary | ICD-10-CM

## 2020-10-22 DIAGNOSIS — E1165 Type 2 diabetes mellitus with hyperglycemia: Secondary | ICD-10-CM

## 2020-10-22 DIAGNOSIS — E785 Hyperlipidemia, unspecified: Secondary | ICD-10-CM | POA: Diagnosis not present

## 2020-10-22 MED ORDER — FREESTYLE LIBRE SENSOR SYSTEM MISC: 2 refills | Status: DC

## 2020-10-22 NOTE — Progress Notes (Signed)
Virtual Visit via Telephone Note  I connected with Melanie Steele, on 10/22/2020 at 9:01 AM by telephone due to the COVID-19 pandemic and verified that I am speaking with the correct person using two identifiers.   Consent: I discussed the limitations, risks, security and privacy concerns of performing an evaluation and management service by telephone and the availability of in person appointments. I also discussed with the patient that there may be a patient responsible charge related to this service. The patient expressed understanding and agreed to proceed.   Location of Patient: Home   Location of Provider: Clinic    Persons participating in Telemedicine visit: Avika Carbine [facility] [address] Juleen China    History of Present Illness: Patient has a visit to f/u chronic medical conditions. She also reports that she had her COVID vaccination series in March/April and just had her booster. She plans to get her flu vaccine later this week. She is traveling to [place] soon.   Chronic HTN Disease Monitoring:  Home BP Monitoring - Does not typically monitor  Chest pain- no  Dyspnea- no Headache - no  Medications: Amlodipine 10 mg, HCTZ 25 mg,  Compliance- yes Lightheadedness- no  Edema- no    HYPERLIPIDEMIA  Symptoms Chest pain on exertion:  no    Leg claudication:   no  Medication Monitoring Compliance- yes, with Crestor 5 mg daily (recently increased from every other day by endo)   Right upper quadrant pain- no   Muscle aches- no    Past Medical History:  Diagnosis Date  . CPAP (continuous positive airway pressure) dependence   . Hyperlipidemia   . Hypertension   . Sleep apnea   . Type 2 diabetes mellitus ([name])   . Vitamin D deficiency    Allergies  Allergen Reactions  . Lisinopril Anaphylaxis  . Other Anaphylaxis    Tree nuts  . Linagliptin Other (See Comments)    Upset stomach  . Metformin And Related Diarrhea    Current Outpatient  Medications on File Prior to Visit  Medication Sig Dispense Refill  . amLODipine (NORVASC) 10 MG tablet Take 1 tablet (10 mg total) by mouth daily. 90 tablet 1  . canagliflozin (INVOKANA) 300 MG TABS tablet Take 1 tablet (300 mg total) by mouth daily. 90 tablet 1  . Continuous Blood Gluc Sensor ([place]) MISC Apply one sensor every 14 days. Dx: E11.49 6 each 0  . Dulaglutide (TRULICITY) 1.5 CB/7.6EG SOPN Inject 1.5 mg into the skin once a week. 6 mL 0  . EPINEPHrine 0.3 mg/0.3 mL IJ SOAJ injection Inject 0.3 mg IM as needed for allergic reaction 1 each 1  . fexofenadine (ALLEGRA) 180 MG tablet Take 1 tablet by mouth every other day.     . fluticasone (FLONASE) 50 MCG/ACT nasal spray Place into both nostrils 2 (two) times daily.    Marland Kitchen gabapentin (NEURONTIN) 100 MG capsule Take 1 capsule (100 mg total) by mouth 3 (three) times daily as needed. 90 capsule 1  . glipiZIDE (GLUCOTROL) 5 MG tablet Take 1 tablet (5 mg total) by mouth daily before breakfast. 90 tablet 1  . hydrochlorothiazide (HYDRODIURIL) 25 MG tablet Take 1 tablet (25 mg total) by mouth every other day. 45 tablet 1  . levothyroxine (SYNTHROID) 150 MCG tablet Take 1 tablet (150 mcg total) by mouth daily. 90 tablet 3  . metoprolol succinate (TOPROL-XL) 50 MG 24 hr tablet Take 1 tablet (50 mg total) by mouth daily. 90 tablet 1  .  Multiple Vitamin (MULTIVITAMIN) tablet Take 1 tablet by mouth daily.    . potassium chloride SA (KLOR-CON) 20 MEQ tablet Take 1 tablet (20 mEq total) by mouth daily. 90 tablet 1  . rosuvastatin (CRESTOR) 5 MG tablet Take 1 tablet (5 mg total) by mouth daily. 90 tablet 3   No current facility-administered medications on file prior to visit.    Observations/Objective: NAD. Speaking clearly.  Work of breathing normal.  Alert and oriented. Mood appropriate.   Assessment and Plan: 1. Essential hypertension BP has been previously well controlled, not currently monitoring at home.  Asymptomatic. Continue regimen of Amlodipine and HCTZ.   2. Dyslipidemia Continue increased dose of Crestor 5 mg daily. No side effects.   3. Postablative hypothyroidism Has been seen by endo and switched from Armour Thyroid to Synthroid.   4. Type 2 diabetes mellitus with hyperglycemia, without long-term current use of insulin (HCC) A1c has show poor control. At prior visit with this provider, increased Trulicity dose. Endo has added Glipizide. Discussed diet adherence.  - Continuous Blood Gluc Sensor ([place]) MISC; Apply one sensor every 14 days. Dx: E11.49  Dispense: 6 each; Refill: 2   Follow Up Instructions: 3 month f/u    I discussed the assessment and treatment plan with the patient. The patient was provided an opportunity to ask questions and all were answered. The patient agreed with the plan and demonstrated an understanding of the instructions.   The patient was advised to call back or seek an in-person evaluation if the symptoms worsen or if the condition fails to improve as anticipated.     I provided 24 minutes total of non-face-to-face time during this encounter including median intraservice time, reviewing previous notes, investigations, ordering medications, medical decision making, coordinating care and patient verbalized understanding at the end of the visit.    Phill Myron, D.O. Primary [facility] at [facility]  10/22/2020, 9:01 AM

## 2020-11-13 DIAGNOSIS — G4733 Obstructive sleep apnea (adult) (pediatric): Secondary | ICD-10-CM | POA: Diagnosis not present

## 2020-11-20 ENCOUNTER — Ambulatory Visit: Payer: BC Managed Care – PPO | Admitting: Internal Medicine

## 2020-12-18 ENCOUNTER — Ambulatory Visit: Payer: BC Managed Care – PPO | Admitting: Internal Medicine

## 2020-12-20 ENCOUNTER — Other Ambulatory Visit: Payer: Self-pay

## 2020-12-21 ENCOUNTER — Ambulatory Visit (INDEPENDENT_AMBULATORY_CARE_PROVIDER_SITE_OTHER): Payer: BC Managed Care – PPO | Admitting: Family

## 2020-12-21 ENCOUNTER — Encounter: Payer: Self-pay | Admitting: Family

## 2020-12-21 ENCOUNTER — Other Ambulatory Visit (HOSPITAL_COMMUNITY)
Admission: RE | Admit: 2020-12-21 | Discharge: 2020-12-21 | Disposition: A | Discharge status: Home / Self Care | Payer: BC Managed Care – PPO | Source: Ambulatory Visit | Attending: Family | Admitting: Family

## 2020-12-21 VITALS — BP 139/83 | HR 92 | Temp 97.3°F | Resp 18 | Ht 65.0 in | Wt 223.4 lb

## 2020-12-21 DIAGNOSIS — R399 Unspecified symptoms and signs involving the genitourinary system: Secondary | ICD-10-CM | POA: Diagnosis not present

## 2020-12-21 DIAGNOSIS — A5901 Trichomonal vulvovaginitis: Secondary | ICD-10-CM | POA: Insufficient documentation

## 2020-12-21 DIAGNOSIS — B373 Candidiasis of vulva and vagina: Secondary | ICD-10-CM | POA: Insufficient documentation

## 2020-12-21 DIAGNOSIS — Z113 Encounter for screening for infections with a predominantly sexual mode of transmission: Secondary | ICD-10-CM | POA: Diagnosis not present

## 2020-12-21 DIAGNOSIS — B379 Candidiasis, unspecified: Secondary | ICD-10-CM

## 2020-12-21 DIAGNOSIS — E1149 Type 2 diabetes mellitus with other diabetic neurological complication: Secondary | ICD-10-CM | POA: Diagnosis not present

## 2020-12-21 LAB — POCT URINALYSIS DIP (CLINITEK)
Bilirubin, UA: NEGATIVE
Glucose, UA: 250 mg/dL — AB
Leukocytes, UA: NEGATIVE
Nitrite, UA: NEGATIVE
POC PROTEIN,UA: NEGATIVE
Spec Grav, UA: >=1.03 — AB (ref 1.010–1.025)
Urobilinogen, UA: 0.2 E.U./dL
pH, UA: 5 (ref 5.0–8.0)

## 2020-12-21 MED ORDER — TRULICITY 1.5 MG/0.5ML ~~LOC~~ SOAJ: 1.5000 mg | SUBCUTANEOUS | 0 refills | Status: DC

## 2020-12-21 NOTE — Patient Instructions (Signed)
Urinalysis negative for UTI.   Vaginal self-swab to screen for yeast infection. Will call with results.  1 month refill of Trulicity for diabetes management. Follow-up with primary physician within 4 weeks. Vaginal Yeast Infection, Adult  Vaginal yeast infection is a condition that causes vaginal discharge as well as soreness, swelling, and redness (inflammation) of the vagina. This is a common condition. Some women get this infection frequently. What are the causes? This condition is caused by a change in the normal balance of the yeast (candida) and bacteria that live in the vagina. This change causes an overgrowth of yeast, which causes the inflammation. What increases the risk? The condition is more likely to develop in women who:  Take antibiotic medicines.  Have diabetes.  Take birth control pills.  Are pregnant.  Douche often.  Have a weak body defense system (immune system).  Have been taking steroid medicines for a long time.  Frequently wear tight clothing. What are the signs or symptoms? Symptoms of this condition include:  White, thick, creamy vaginal discharge.  Swelling, itching, redness, and irritation of the vagina. The lips of the vagina (vulva) may be affected as well.  Pain or a burning feeling while urinating.  Pain during sex. How is this diagnosed? This condition is diagnosed based on:  Your medical history.  A physical exam.  A pelvic exam. Your health care provider will examine a sample of your vaginal discharge under a microscope. Your health care provider may send this sample for testing to confirm the diagnosis. How is this treated? This condition is treated with medicine. Medicines may be over-the-counter or prescription. You may be told to use one or more of the following:  Medicine that is taken by mouth (orally).  Medicine that is applied as a cream (topically).  Medicine that is inserted directly into the vagina  (suppository). Follow these instructions at home:  Lifestyle  Do not have sex until your health care provider approves. Tell your sex partner that you have a yeast infection. That person should go to his or her health care provider and ask if they should also be treated.  Do not wear tight clothes, such as pantyhose or tight pants.  Wear breathable cotton underwear. General instructions  Take or apply over-the-counter and prescription medicines only as told by your health care provider.  Eat more yogurt. This may help to keep your yeast infection from returning.  Do not use tampons until your health care provider approves.  Try taking a sitz bath to help with discomfort. This is a warm water bath that is taken while you are sitting down. The water should only come up to your hips and should cover your buttocks. Do this 3-4 times per day or as told by your health care provider.  Do not douche.  If you have diabetes, keep your blood sugar levels under control.  Keep all follow-up visits as told by your health care provider. This is important. Contact a health care provider if:  You have a fever.  Your symptoms go away and then return.  Your symptoms do not get better with treatment.  Your symptoms get worse.  You have new symptoms.  You develop blisters in or around your vagina.  You have blood coming from your vagina and it is not your menstrual period.  You develop pain in your abdomen. Summary  Vaginal yeast infection is a condition that causes discharge as well as soreness, swelling, and redness (inflammation) of the  vagina.  This condition is treated with medicine. Medicines may be over-the-counter or prescription.  Take or apply over-the-counter and prescription medicines only as told by your health care provider.  Do not douche. Do not have sex or use tampons until your health care provider approves.  Contact a health care provider if your symptoms do not get  better with treatment or your symptoms go away and then return. This information is not intended to replace advice given to you by your health care provider. Make sure you discuss any questions you have with your health care provider. Document Revised: 07/01/2019 Document Reviewed: 04/19/2018 Elsevier Patient Education  [place].

## 2020-12-21 NOTE — Progress Notes (Unsigned)
Reports vaginal discharge x 2 wks, pink/red in color, also reports vaginal burning/irritation   Also needs IFOBT kit

## 2020-12-21 NOTE — Progress Notes (Signed)
Patient ID: Melanie Steele, female    DOB: 01/14/1961  MRN: [id number]  CC: UTI symptoms    Subjective: Melanie Steele is a 60 y.o. female who presents for urinary symptoms.  1. VAGINAL SYMPTOMS: Reports vaginal discharge pink-red in color since returning from [place]. Says discharge is not coming from the vagina but the area near the outside of the vagina on the left side. Reports she participated in a riverwalk while in [place] and 1 day after began to experience vaginal discharge. Says has improved since initially began. Reports she did research online and thinks it may be a Bartholin cyst that has been present for some time but what irritated by the riverwalk. Also, having cloudy urine. Denies itching. Using Epsom salt baths and hot showers.  Patient Active Problem List   Diagnosis Date Noted  . Dyslipidemia 08/22/2020  . Type 2 diabetes mellitus with hyperglycemia, without long-term current use of insulin (HCC) 08/22/2020  . 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 Hyperlipidemia 07/08/2019  . Sleep apnea 07/08/2019  . Essential hypertension 03/02/2019  . Type 2 diabetes mellitus without complication, without long-term current use of insulin (HCC) 03/02/2019  . Acquired hypothyroidism 03/02/2019     Current Outpatient Medications on File Prior to Visit  Medication Sig Dispense Refill  . amLODipine (NORVASC) 10 MG tablet Take 1 tablet (10 mg total) by mouth daily. 90 tablet 1  . canagliflozin (INVOKANA) 300 MG TABS tablet Take 1 tablet (300 mg total) by mouth daily. 90 tablet 1  . Continuous Blood Gluc Sensor (FREESTYLE LIBRE SENSOR SYSTEM) MISC Apply one sensor every 14 days. Dx: E11.49 6 each 2  . EPINEPHrine 0.3 mg/0.3 mL IJ SOAJ injection Inject 0.3 mg IM as needed for allergic reaction 1 each 1  . fexofenadine (ALLEGRA) 180 MG tablet Take 1 tablet by mouth every other day.     . fluticasone (FLONASE) 50 MCG/ACT nasal spray Place into both nostrils 2 (two) times daily.    Marland Kitchen  gabapentin (NEURONTIN) 100 MG capsule Take 1 capsule (100 mg total) by mouth 3 (three) times daily as needed. 90 capsule 1  . glipiZIDE (GLUCOTROL) 5 MG tablet Take 1 tablet (5 mg total) by mouth daily before breakfast. 90 tablet 1  . hydrochlorothiazide (HYDRODIURIL) 25 MG tablet Take 1 tablet (25 mg total) by mouth every other day. 45 tablet 1  . levothyroxine (SYNTHROID) 150 MCG tablet Take 1 tablet (150 mcg total) by mouth daily. 90 tablet 3  . metoprolol succinate (TOPROL-XL) 50 MG 24 hr tablet Take 1 tablet (50 mg total) by mouth daily. 90 tablet 1  . Multiple Vitamin (MULTIVITAMIN) tablet Take 1 tablet by mouth daily.    . potassium chloride SA (KLOR-CON) 20 MEQ tablet Take 1 tablet (20 mEq total) by mouth daily. 90 tablet 1  . rosuvastatin (CRESTOR) 5 MG tablet Take 1 tablet (5 mg total) by mouth daily. 90 tablet 3   No current facility-administered medications on file prior to visit.    Allergies  Allergen Reactions  . Lisinopril Anaphylaxis  . Other Anaphylaxis    Tree nuts  . Linagliptin Other (See Comments)    Upset stomach  . Metformin And Related Diarrhea    Social History   Socioeconomic History  . Marital status: Widowed    Spouse name: Not on file  . Number of children: Not on file  . Years of education: Not on file  . Highest education level: Not on file  Occupational History  . Not  on file  Tobacco Use  . Smoking status: Never Smoker  . Smokeless tobacco: Never Used  Vaping Use  . Vaping Use: Never used  Substance and Sexual Activity  . Alcohol use: Yes    Comment: occ  . Drug use: Never  . Sexual activity: Not on file  Other Topics Concern  . Not on file  Social History Narrative  . Not on file   Social Determinants of Health   Financial Resource Strain: Not on file  Food Insecurity: Not on file  Transportation Needs: Not on file  Physical Activity: Not on file  Stress: Not on file  Social Connections: Not on file  Intimate Partner Violence:  Not on file    Family History  Problem Relation Age of Onset  . Diabetes Mother   . Hypertension Mother   . Stroke Father   . Hypertension Sister     Past Surgical History:  Procedure Laterality Date  . ABDOMINAL HYSTERECTOMY    . TONSILLECTOMY    . TOOTH EXTRACTION    . TRIGGER FINGER RELEASE      ROS: Review of Systems Negative except as stated above  PHYSICAL EXAM: BP 139/83 (BP Location: Right Arm, Patient Position: Sitting, Cuff Size: Large)   Pulse 92   Temp (!) 97.3 F (36.3 C) (Temporal)   Resp 18   Ht 5\' 5"  (1.651 m)   Wt 223 lb 6.4 oz (101.3 kg)   SpO2 94%   BMI 37.18 kg/m   Physical Exam Constitutional:      Appearance: She is obese.  HENT:     Head: Normocephalic.  Eyes:     Extraocular Movements: Extraocular movements intact.     Pupils: Pupils are equal, round, and reactive to light.  Cardiovascular:     Rate and Rhythm: Normal rate and regular rhythm.  Pulmonary:     Effort: Pulmonary effort is normal.     Breath sounds: Normal breath sounds.  Genitourinary:    Comments: Patient declined examination. Musculoskeletal:     Cervical back: Normal range of motion and neck supple.  Neurological:     General: No focal deficit present.     Mental Status: She is alert.  Psychiatric:        Mood and Affect: Mood normal.        Behavior: Behavior normal.    Results for orders placed or performed in visit on 12/21/20  POCT URINALYSIS DIP (CLINITEK)  Result Value Ref Range   Color, UA light yellow (A) yellow   Clarity, UA clear clear   Glucose, UA =250 (A) negative mg/dL   Bilirubin, UA negative negative   Ketones, POC UA small (15) (A) negative mg/dL   Spec Grav, UA >=8.295 (A) 1.010 - 1.025   Blood, UA trace-lysed (A) negative   pH, UA 5.0 5.0 - 8.0   POC PROTEIN,UA negative negative, trace   Urobilinogen, UA 0.2 0.2 or 1.0 E.U./dL   Nitrite, UA Negative Negative   Leukocytes, UA Negative Negative    ASSESSMENT AND PLAN: 1. UTI  symptoms: - Urinalysis negative for urinary tract infection.  - Cervicovaginal ancillary to screen for chlamydia, gonorrhea, bacterial vaginitis, trichomonas, and yeast infection.  - Follow-up with primary physician as needed. - POCT URINALYSIS DIP (CLINITEK) - Cervicovaginal ancillary only  2. Type 2 diabetes mellitus with other neurologic complication, without long-term current use of insulin [facility]): - Patient requests refill on Trulicity. Courtesy refill provided. Last visit with primary physician 10/22/2020. - Keep  all appointments with primary physician and Endocrinology.  - Dulaglutide (TRULICITY) 1.5 MG/0.5ML SOPN; Inject 1.5 mg into the skin once a week. Courtesy refill. Please schedule appointment with primary physician for additional refills.  Dispense: 3 mL; Refill: 0  Patient was given the opportunity to ask questions.  Patient verbalized understanding of the plan and was able to repeat key elements of the plan. Patient was given clear instructions to go to Emergency Department or return to medical center if symptoms don't improve, worsen, or new problems develop.The patient verbalized understanding.   Orders Placed This Encounter  Procedures  . POCT URINALYSIS DIP (CLINITEK)     Requested Prescriptions   Signed Prescriptions Disp Refills  . Dulaglutide (TRULICITY) 1.5 MG/0.5ML SOPN 3 mL 0    Sig: Inject 1.5 mg into the skin once a week. Courtesy refill. Please schedule appointment with primary physician for additional refills.    Return in about 4 weeks (around 01/18/2021) for Dr. Earlene Plater.  Rema Fendt, NP

## 2020-12-24 ENCOUNTER — Encounter: Payer: Self-pay | Admitting: Family

## 2020-12-24 LAB — CERVICOVAGINAL ANCILLARY ONLY
Bacterial Vaginitis (gardnerella): NEGATIVE
Candida Glabrata: POSITIVE — AB
Candida Vaginitis: NEGATIVE
Chlamydia: NEGATIVE
Comment: NEGATIVE
Comment: NEGATIVE
Comment: NEGATIVE
Comment: NEGATIVE
Comment: NEGATIVE
Comment: NORMAL
Neisseria Gonorrhea: NEGATIVE
Trichomonas: POSITIVE — AB

## 2020-12-25 ENCOUNTER — Telehealth: Payer: Self-pay

## 2020-12-25 DIAGNOSIS — A5901 Trichomonal vulvovaginitis: Secondary | ICD-10-CM | POA: Insufficient documentation

## 2020-12-25 DIAGNOSIS — B379 Candidiasis, unspecified: Secondary | ICD-10-CM | POA: Insufficient documentation

## 2020-12-25 MED ORDER — FLUCONAZOLE 150 MG PO TABS: 150.0000 mg | ORAL_TABLET | Freq: Once | ORAL | 0 refills | Status: AC

## 2020-12-25 MED ORDER — METRONIDAZOLE 500 MG PO TABS: 500.0000 mg | ORAL_TABLET | Freq: Two times a day (BID) | ORAL | 0 refills | Status: AC

## 2020-12-25 NOTE — Addendum Note (Signed)
Addended by: Camillia Herter on: 12/25/2020 07:44 AM   Modules accepted: Orders

## 2020-12-25 NOTE — Telephone Encounter (Signed)
Please call patient with update.   Patient has Trichomonas which is a sexually transmitted infection. Metronidazole prescribed and sent to pharmacy. Both patient and her partner need to be treated for Trichomonas. Also, both need to complete treatment prior to having unprotected sex again. Do not cosume alcohol while taking prescribed antibotic.   Your STI panel resulted positive for Candida Glabrata which is better known as a yeast infection. You have been prescribed Fluconazole 150 mg (1 tablet) for a one time dose to treat this.  Follow-up with primary physician if symptoms do not improve and/or worsen.

## 2020-12-25 NOTE — Telephone Encounter (Signed)
Att to contact pt about test results, no ans lvm to call ofc

## 2020-12-25 NOTE — Telephone Encounter (Signed)
Spoke w/pt advised of lab results. Pt stated that she has picked up meds

## 2021-01-02 ENCOUNTER — Telehealth: Payer: Self-pay

## 2021-01-02 NOTE — Telephone Encounter (Signed)
PA COMPLETED THRU [employer]OVER[employer]MEDS FOR TRULICITY-#[id number]- RESPONSE:Drug is covered by current benefit plan. No further PA activity needed.

## 2021-01-18 ENCOUNTER — Encounter: Payer: Self-pay | Admitting: Internal Medicine

## 2021-01-18 ENCOUNTER — Other Ambulatory Visit: Payer: Self-pay

## 2021-01-18 ENCOUNTER — Ambulatory Visit (INDEPENDENT_AMBULATORY_CARE_PROVIDER_SITE_OTHER): Payer: BC Managed Care – PPO | Admitting: Internal Medicine

## 2021-01-18 VITALS — BP 129/81 | HR 89 | Temp 98.1°F | Resp 18 | Ht 65.0 in | Wt 223.2 lb

## 2021-01-18 DIAGNOSIS — M79641 Pain in right hand: Secondary | ICD-10-CM

## 2021-01-18 DIAGNOSIS — A5901 Trichomonal vulvovaginitis: Secondary | ICD-10-CM | POA: Diagnosis not present

## 2021-01-18 DIAGNOSIS — E1149 Type 2 diabetes mellitus with other diabetic neurological complication: Secondary | ICD-10-CM

## 2021-01-18 DIAGNOSIS — Z1211 Encounter for screening for malignant neoplasm of colon: Secondary | ICD-10-CM | POA: Diagnosis not present

## 2021-01-18 DIAGNOSIS — G8929 Other chronic pain: Secondary | ICD-10-CM | POA: Diagnosis not present

## 2021-01-18 DIAGNOSIS — M79642 Pain in left hand: Secondary | ICD-10-CM

## 2021-01-18 DIAGNOSIS — I10 Essential (primary) hypertension: Secondary | ICD-10-CM | POA: Diagnosis not present

## 2021-01-18 LAB — POCT CBG (FASTING - GLUCOSE)-MANUAL ENTRY: Glucose Fasting, POC: 187 mg/dL — AB (ref 70–99)

## 2021-01-18 LAB — POCT GLYCOSYLATED HEMOGLOBIN (HGB A1C): Hemoglobin A1C: 9.6 % — AB (ref 4.0–5.6)

## 2021-01-18 MED ORDER — TRULICITY 1.5 MG/0.5ML ~~LOC~~ SOAJ: 1.5000 mg | SUBCUTANEOUS | 0 refills | Status: DC

## 2021-01-18 MED ORDER — HYDROCHLOROTHIAZIDE 25 MG PO TABS: 25.0000 mg | ORAL_TABLET | ORAL | 1 refills | Status: DC

## 2021-01-18 NOTE — Progress Notes (Signed)
Subjective:    Melanie Steele - 60 y.o. female MRN [id number]  Date of birth: 03/21/1961  HPI  Melanie Steele is here for follow up of chronic medical conditions.  Has had three hand surgeries for trigger fingers. Has had at least ten steroid injections in each hand. Has completed OT. Ring fingers have been acting up since last summer. Suffers from chronic hand stiffness. She is very concerned that she has RA. Reports she is unable to complete some daily activities such as opening jars. She types chronically for work.   Chronic HTN Disease Monitoring:  Home BP Monitoring - Does not monitor  Chest pain- no  Dyspnea- no Headache - no  Medications: Amlodipine 10 mg, HCTZ 25 mg every other day, Metoprolol XL 50 mg  Compliance- yes Lightheadedness- no  Edema- no   Diabetes mellitus, Type 2 Established with endocrinology. Has a f/u scheduled on 2/8. Reports she knows she could be doing better with diet. She needs refill for Trulicity for her dose for next week.  Disease Monitoring             Blood Sugar Ranges: Fasting - 180-200s              Polyuria: no             Visual problems: no   Urine Microalbumin 12 (Aug 2021)   Last A1C: 9.8 (Aug 2021)   Medications: Glipizide 5 mg, Invokana 952 mg, Trulicity 1.5 mg weekly  Medication Compliance: yes  Medication Side Effects             Hypoglycemia: no    Health Maintenance:  Health Maintenance Due  Topic Date Due  . COLONOSCOPY (Pts 45-33yrs Insurance coverage will need to be confirmed)  Never done  . HEMOGLOBIN A1C  01/17/2021    -  reports that she has never smoked. She has never used smokeless tobacco. - Review of Systems: Per HPI. - Past Medical History: Patient Active Problem List   Diagnosis Date Noted  . Trichomonas vaginitis 12/25/2020  . Candida glabrata infection 12/25/2020  . Dyslipidemia 08/22/2020  . Type 2 diabetes mellitus with hyperglycemia, without long-term current use of insulin ([place])  08/22/2020  . 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 Hyperlipidemia 07/08/2019  . Sleep apnea 07/08/2019  . Essential hypertension 03/02/2019  . Type 2 diabetes mellitus without complication, without long-term current use of insulin ([place]) 03/02/2019  . Acquired hypothyroidism 03/02/2019   - Medications: reviewed and updated   Objective:   Physical Exam BP 129/81 (BP Location: Right Arm, Patient Position: Sitting, Cuff Size: Large)   Pulse 89   Temp 98.1 F (36.7 C) (Oral)   Resp 18   Ht 5\' 5"  (1.651 m)   Wt 223 lb 3.2 oz (101.2 kg)   SpO2 94%   BMI 37.14 kg/m  Physical Exam Constitutional:      General: She is not in acute distress.    Appearance: She is not diaphoretic.  HENT:     Head: Normocephalic and atraumatic.  Eyes:     Extraocular Movements: EOM normal.     Conjunctiva/sclera: Conjunctivae normal.  Cardiovascular:     Rate and Rhythm: Normal rate and regular rhythm.     Heart sounds: Normal heart sounds. No murmur heard.   Pulmonary:     Effort: Pulmonary effort is normal. No respiratory distress.     Breath sounds: Normal breath sounds.  Musculoskeletal:        General: Normal range of motion.  Skin:    General: Skin is warm and dry.  Neurological:     Mental Status: She is alert and oriented to person, place, and time.  Psychiatric:        Mood and Affect: Affect normal.        Judgment: Judgment normal.            Assessment & Plan:   1. Type 2 diabetes mellitus with other neurologic complication, without long-term current use of insulin (HCC) A1c uncontrolled. Followed by endo with upcoming appointment. Discussed need to keep this visit. Discussed diabetic diet. Provided refill for Trulicity. Patient reports no missed doses.  - HgB A1c - Glucose (CBG), Fasting - Dulaglutide (TRULICITY) 1.5 FV/4.9SW SOPN; Inject 1.5 mg into the skin once a week.  Dispense: 7 mL; Refill: 0  2. Essential hypertension BP at goal. Asymptomatic. Continue  current regimen.  - hydrochlorothiazide (HYDRODIURIL) 25 MG tablet; Take 1 tablet (25 mg total) by mouth every other day.  Dispense: 45 tablet; Refill: 1  3. Screening for colon cancer Discussed various screening modalities for colon cancer. Patient elected for cologuard.  - Cologuard  4. Trichomonas vaginitis Patient with positive trichomonas test in January. Reports completed treatment. Not currently sexually active. Plan for TOC at return visit.   5. Chronic hand pain, left 6. Chronic pain of right hand I suspect most of pain is related to history of multiple trigger fingers likely stemming from uncontrolled DM. Patient reports x-rays have been obtained, unable to review, but OA may also be component given age and report of frequent typing. Patient concerned about RA so will obtain labs. Have placed new referral to hand surgery for further evaluation and management.  - Rheumatoid factor - Sedimentation Rate - C-reactive protein - Ambulatory referral to Hand Surgery - CYCLIC CITRUL PEPTIDE ANTIBODY, IGG/IGA    Phill Myron, D.O. 01/18/2021, 9:06 AM Primary Care at [facility]

## 2021-01-18 NOTE — Progress Notes (Signed)
Discuss infection she tested positive for last visit  Chronic hand stiffness/pain   Fluctuating blood sugar symptoms (dizziness)  Med refills

## 2021-01-20 LAB — CYCLIC CITRUL PEPTIDE ANTIBODY, IGG/IGA: Cyclic Citrullin Peptide Ab: 15 units (ref 0–19)

## 2021-01-20 LAB — C-REACTIVE PROTEIN: CRP: 8 mg/L (ref 0–10)

## 2021-01-20 LAB — SEDIMENTATION RATE: Sed Rate: 20 mm/hr (ref 0–40)

## 2021-01-20 LAB — RHEUMATOID FACTOR: Rheumatoid fact SerPl-aCnc: <10 IU/mL (ref <14.0)

## 2021-01-21 ENCOUNTER — Encounter: Payer: Self-pay | Admitting: Internal Medicine

## 2021-01-22 ENCOUNTER — Other Ambulatory Visit: Payer: Self-pay

## 2021-01-22 ENCOUNTER — Ambulatory Visit (INDEPENDENT_AMBULATORY_CARE_PROVIDER_SITE_OTHER): Payer: BC Managed Care – PPO | Admitting: Internal Medicine

## 2021-01-22 ENCOUNTER — Encounter: Payer: Self-pay | Admitting: Internal Medicine

## 2021-01-22 VITALS — BP 140/82 | HR 86 | Ht 65.0 in | Wt 222.0 lb

## 2021-01-22 DIAGNOSIS — E1165 Type 2 diabetes mellitus with hyperglycemia: Secondary | ICD-10-CM

## 2021-01-22 DIAGNOSIS — E039 Hypothyroidism, unspecified: Secondary | ICD-10-CM

## 2021-01-22 LAB — TSH: TSH: 3.4 u[IU]/mL (ref 0.35–4.50)

## 2021-01-22 MED ORDER — TRULICITY 1.5 MG/0.5ML ~~LOC~~ SOAJ: 1.5000 mg | SUBCUTANEOUS | 3 refills | Status: DC

## 2021-01-22 MED ORDER — CANAGLIFLOZIN 300 MG PO TABS: 300.0000 mg | ORAL_TABLET | Freq: Every day | ORAL | 3 refills | Status: DC

## 2021-01-22 MED ORDER — GLIPIZIDE 5 MG PO TABS: 5.0000 mg | ORAL_TABLET | Freq: Two times a day (BID) | ORAL | 3 refills | Status: DC

## 2021-01-22 MED ORDER — ONETOUCH VERIO VI STRP: ORAL_STRIP | 3 refills | Status: AC

## 2021-01-22 NOTE — Progress Notes (Unsigned)
Name: Melanie Steele  Age/ Sex: 60 y.o., female   MRN/ DOB: [id number], 12-06-1961     PCP: Nicolette Bang, DO   Reason for Endocrinology Evaluation: Type 2 Diabetes Mellitus  Initial Endocrine Consultative Visit: 08/21/2021    PATIENT IDENTIFIER: Melanie Steele is a 60 y.o. female with a past medical history of T2DM, OSA, HTN and Dyslipidemia. The patient has followed with Endocrinology clinic since 08/21/2020 for consultative assistance with management of her diabetes.  DIABETIC HISTORY:  Melanie Steele was diagnosed with DM many years ago. Metformin-  Tearing stomach   Her hemoglobin A1c has ranged from 7.9% in 2020, peaking at 9.8% in 2021.  On her initial visit to our clinic she had an A1c 9.8%, she was on Invokana and Trulicity, we started Glipizide      THYROID HISTORY: Has been diagnosed with hyperthyroidism in 1993 secondary to Graves' disease . She is S/P RAI ablation, followed by LT- 4 replacement. She was on levothyroxine initially and switched to armour a few years ago ( ~ 2019) . She has been having issues with adjusting her levels.  By 08/2021 we switched from Armour to Levothyroxine    Father with thyroid disease    SUBJECTIVE:   During the last visit (08/21/2021): A1c 9.8% . Started Glipizide, continued Trulicity and Invokana    Today (01/22/2021): Melanie Steele is here for a follow up on diabetes and hypothyroidism.  She checks her blood sugars multiple times daily. The patient has  had hypoglycemic episodes since the last clinic visit, which she attributes to not eating any carbs one day.    Denies nausea and diarrhea  Weight has been stable  Denies constipation or diarrhea     HOME DIABETES REGIMEN:  Glipizide 5 mg daily before breakfast   Invokana 300 mg daily with Breakfast  Trulicity 1.5 mg weekly  Levothyroxine 150 mcg daily    Statin: yes ACE-I/ARB: no    CONTINUOUS GLUCOSE MONITORING RECORD INTERPRETATION    Dates of Recording:  1/25-01/21/2021  Sensor description: [employer]  Results statistics:   CGM use % of time 41  Average and SD 185/22  Time in range     51   %  % Time Above 180 40  % Time above 250 9  % Time Below target 0    Glycemic patterns summary: Hyperglycemia during the day , within goal overnight   Hyperglycemic episodes  Postprandial   Hypoglycemic episodes occurred N/A  Overnight periods:  Trends down to just below 180 mg/dL       DIABETIC COMPLICATIONS: Microvascular complications:    Denies: CKD, retinopathy, neuropathy   Last Eye Exam: Completed 2020  Macrovascular complications:    Denies: CAD, CVA, PVD   HISTORY:  Past Medical History:  Past Medical History:  Diagnosis Date  . CPAP (continuous positive airway pressure) dependence   . Hyperlipidemia   . Hypertension   . Sleep apnea   . Type 2 diabetes mellitus ([place])   . Vitamin D deficiency    Past Surgical History:  Past Surgical History:  Procedure Laterality Date  . ABDOMINAL HYSTERECTOMY    . TONSILLECTOMY    . TOOTH EXTRACTION    . TRIGGER FINGER RELEASE      Social History:  reports that she has never smoked. She has never used smokeless tobacco. She reports current alcohol use. She reports that she does not use drugs. Family History:  Family History  Problem Relation Age of Onset  .  Diabetes Mother   . Hypertension Mother   . Stroke Father   . Hypertension Sister      HOME MEDICATIONS: Allergies as of 01/22/2021      Reactions   Lisinopril Anaphylaxis   Other Anaphylaxis   Tree nuts   Linagliptin Other (See Comments)   Upset stomach   Metformin And Related Diarrhea      Medication List       Accurate as of January 22, 2021  1:44 PM. If you have any questions, ask your nurse or doctor.        amLODipine 10 MG tablet Commonly known as: NORVASC Take 1 tablet (10 mg total) by mouth daily.   canagliflozin 300 MG Tabs tablet Commonly known as: Invokana Take 1 tablet (300 mg  total) by mouth daily.   EPINEPHrine 0.3 mg/0.3 mL Soaj injection Commonly known as: EPI-PEN Inject 0.3 mg IM as needed for allergic reaction   fexofenadine 180 MG tablet Commonly known as: ALLEGRA Take 1 tablet by mouth every other day.   fluticasone 50 MCG/ACT nasal spray Commonly known as: FLONASE Place into both nostrils 2 (two) times daily.   FreeStyle [employer] Apply one sensor every 14 days. Dx: E11.49   gabapentin 100 MG capsule Commonly known as: NEURONTIN Take 1 capsule (100 mg total) by mouth 3 (three) times daily as needed.   glipiZIDE 5 MG tablet Commonly known as: GLUCOTROL Take 1 tablet (5 mg total) by mouth daily before breakfast.   hydrochlorothiazide 25 MG tablet Commonly known as: HYDRODIURIL Take 1 tablet (25 mg total) by mouth every other day.   levothyroxine 150 MCG tablet Commonly known as: SYNTHROID Take 1 tablet (150 mcg total) by mouth daily.   metoprolol succinate 50 MG 24 hr tablet Commonly known as: TOPROL-XL Take 1 tablet (50 mg total) by mouth daily.   multivitamin tablet Take 1 tablet by mouth daily.   potassium chloride SA 20 MEQ tablet Commonly known as: KLOR-CON Take 1 tablet (20 mEq total) by mouth daily.   rosuvastatin 5 MG tablet Commonly known as: CRESTOR Take 1 tablet (5 mg total) by mouth daily.   Trulicity 1.5 CB/7.6EG Sopn Generic drug: Dulaglutide Inject 1.5 mg into the skin once a week.        OBJECTIVE:   Vital Signs: BP 140/82   Pulse 86   Ht 5\' 5"  (1.651 m)   Wt 222 lb (100.7 kg)   SpO2 98%   BMI 36.94 kg/m   Wt Readings from Last 3 Encounters:  01/22/21 222 lb (100.7 kg)  01/18/21 223 lb 3.2 oz (101.2 kg)  12/21/20 223 lb 6.4 oz (101.3 kg)     Exam: General: Pt appears well and is in NAD  Lungs: Clear with good BS bilat with no rales, rhonchi, or wheezes  Heart: RRR with normal S1 and S2 and no gallops; no murmurs; no rub  Abdomen: Normoactive bowel sounds, soft, nontender,  without masses or organomegaly palpable  Extremities: No pretibial edema.   Neuro: MS is good with appropriate affect, pt is alert and Ox3    DM foot exam: 08/21/2020  The skin of the feet is intact without sores or ulcerations. The pedal pulses are 2+ on right and 2+ on left. The sensation is intact to a screening 5.07, 10 gram monofilament bilaterally    DATA REVIEWED:  Lab Results  Component Value Date   HGBA1C 9.6 (A) 01/18/2021   HGBA1C 9.8 (A) 07/17/2020   HGBA1C  8.3 (H) 06/06/2020   Lab Results  Component Value Date   LDLCALC 111 (H) 07/17/2020   CREATININE 0.64 04/16/2020   Lab Results  Component Value Date   MICRALBCREAT 12 07/17/2020     Lab Results  Component Value Date   CHOL 188 07/17/2020   HDL 57 07/17/2020   LDLCALC 111 (H) 07/17/2020   TRIG 115 07/17/2020   CHOLHDL 3.3 07/17/2020       Results for AARICA, WAX (MRN [id number]) as of 01/23/2021 07:34  Ref. Range 01/22/2021 14:02  TSH Latest Ref Range: 0.35 - 4.50 uIU/mL 3.40    ASSESSMENT / PLAN / RECOMMENDATIONS:   1) Type 2 Diabetes Mellitus, Poorly controlled, Without complications - Most recent A1c of 9.6 %. Goal A1c < 7.0 %.   - Pt admits to dietary indiscretions over the holiday - She endorses a CGM reading close to 50 mg/dL while sleeping, by the next morning her Bg was 98 mg/dL. I have discussed with her that CGM readings  Don't have 100 % accuracy as well as signal loss etc , and will need to confirm with a finger stick. She was provided with One touch Verio meter today  - Will increase Glipizide at this time, she was advised , reduce the evening to half a tablet should her fasting remain < 100 mg/dL      MEDICATIONS: - Increase Glipizide 5 mg , 1 tablet before Breakfast and 1 tablet before Supper  - Continue Invokana 300 mg daily with Breakfast  - Continue Trulicity 1.5 mg weekly     EDUCATION / INSTRUCTIONS:  BG monitoring instructions: Patient is instructed to check her blood  sugars 3 times a day, before meals   Call [facility] if: BG persistently < 70  . I reviewed the Rule of 15 for the treatment of hypoglycemia in detail with the patient. Literature supplied.    2) Diabetic complications:   Eye: Does not have known diabetic retinopathy.   Neuro/ Feet: Does not have known diabetic peripheral neuropathy .   Renal: Patient does not have known baseline CKD. She   is not  on an ACEI/ARB at present.    3) Postablative Hypothyroidism:   - No local neck symptoms  -- Pt educated extensively on the correct way to take levothyroxine (first thing in the morning with water, 30 minutes before eating or taking other medications). - Pt encouraged to double dose the following day if she were to miss a dose given long half-life of levothyroxine. - Repeat TSH is normal   Medication:  Continue Levothyroxine 150 mcg daily    F/U in 4 months    Signed electronically by: Mack Guise, MD  [facility]  [facility] [address] [place], [place] [address] Phone: [phone] FAX: [phone]   CC: Nicolette Bang, [address] [place], [place] [address] Phone: [phone]  Fax: [phone]  Return to Endocrinology clinic as below: Future Appointments  Date Time Provider [place]  01/25/2021  9:15 AM Leandrew Koyanagi, MD [facility] None

## 2021-01-22 NOTE — Patient Instructions (Signed)
-   Increase Glipizide 5 mg , 1 tablet before Breakfast and 1 tablet before Supper  - Continue Invokana 300 mg daily with Breakfast  - Continue Trulicity 1.5 mg weekly    - Continue Levothyroxine 150 mcg daily for now      HOW TO TREAT LOW BLOOD SUGARS (Blood sugar LESS THAN 70 MG/DL)  Please follow the RULE OF 15 for the treatment of hypoglycemia treatment (when your (blood sugars are less than 70 mg/dL)    STEP 1: Take 15 grams of carbohydrates when your blood sugar is low, which includes:   3-4 GLUCOSE TABS  OR  3-4 OZ OF JUICE OR REGULAR SODA OR  ONE TUBE OF GLUCOSE GEL     STEP 2: RECHECK blood sugar in 15 MINUTES STEP 3: If your blood sugar is still low at the 15 minute recheck --> then, go back to STEP 1 and treat AGAIN with another 15 grams of carbohydrates.

## 2021-01-25 ENCOUNTER — Ambulatory Visit (INDEPENDENT_AMBULATORY_CARE_PROVIDER_SITE_OTHER): Payer: BC Managed Care – PPO | Admitting: Orthopaedic Surgery

## 2021-01-25 ENCOUNTER — Ambulatory Visit: Payer: Self-pay

## 2021-01-25 ENCOUNTER — Encounter: Payer: Self-pay | Admitting: Orthopaedic Surgery

## 2021-01-25 DIAGNOSIS — M79642 Pain in left hand: Secondary | ICD-10-CM | POA: Diagnosis not present

## 2021-01-25 DIAGNOSIS — M79641 Pain in right hand: Secondary | ICD-10-CM

## 2021-01-25 NOTE — Progress Notes (Signed)
Office Visit Note   Patient: Melanie Steele           Date of Birth: 18-Aug-1961           MRN: [id number] Visit Date: 01/25/2021              Requested by: Nicolette Bang, DO [place],  [place] [address] PCP: Nicolette Bang, DO   Assessment & Plan: Visit Diagnoses:  1. Bilateral hand pain     Plan: Impression is bilateral ring finger stenosing tenosynovitis.  At this point we have discussed the need to control her diabetes in order to help with her symptoms.  She will follow up with [place] as needed.  Total face to face encounter time was greater than 45 minutes and over half of this time was spent in counseling and/or coordination of care.  Follow-Up Instructions: Return if symptoms worsen or fail to improve.   Orders:  Orders Placed This Encounter  Procedures  . XR Hand Complete Right  . XR Hand Complete Left   No orders of the defined types were placed in this encounter.     Procedures: No procedures performed   Clinical Data: No additional findings.   Subjective: Chief Complaint  Patient presents with  . Right Hand - Pain  . Left Hand - Pain    HPI patient is a pleasant right-hand-dominant female who comes in today with bilateral ring finger pain and stiffness right greater than left.  This is been ongoing for the past 8 months.  She notes that she initially had triggering and was seen by a physician where trigger finger injections were performed.  She notes mild relief in symptoms.  She has had pain and stiffness since.  The pain is constant and is worse with she is flexing her fingers.  She has tried over-the-counter pain medication without relief.  She denies any numbness, tingling or burning.  She has been worked up for autoimmune disease which was negative.  Of note, she is an uncontrolled diabetic with her last hemoglobin A1c of 9.6.  Review of Systems as detailed in HPI.  All others reviewed and are  negative.   Objective: Vital Signs: There were no vitals taken for this visit.  Physical Exam well-developed well-nourished female no acute distress.  Alert and oriented x3.  Ortho Exam bilateral hand exam shows mild to moderate tenderness palpation along the A1 pulley of the ring fingers.  She does have limitation with full flexion.  She is neurovascular intact distally.  Specialty Comments:  No specialty comments available.  Imaging: XR Hand Complete Left  Result Date: 01/25/2021 No acute or structural abnormalities  XR Hand Complete Right  Result Date: 01/25/2021 No acute or structural abnormalities    PMFS History: Patient Active Problem List   Diagnosis Date Noted  . Trichomonas vaginitis 12/25/2020  . Candida glabrata infection 12/25/2020  . Dyslipidemia 08/22/2020  . Type 2 diabetes mellitus with hyperglycemia, without long-term current use of insulin ([place]) 08/22/2020  . 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 Hyperlipidemia 07/08/2019  . Sleep apnea 07/08/2019  . Essential hypertension 03/02/2019  . Type 2 diabetes mellitus without complication, without long-term current use of insulin ([place]) 03/02/2019  . Acquired hypothyroidism 03/02/2019   Past Medical History:  Diagnosis Date  . CPAP (continuous positive airway pressure) dependence   . Hyperlipidemia   . Hypertension   . Sleep apnea   . Type 2 diabetes mellitus ([place])   . Vitamin D  deficiency     Family History  Problem Relation Age of Onset  . Diabetes Mother   . Hypertension Mother   . Stroke Father   . Hypertension Sister     Past Surgical History:  Procedure Laterality Date  . ABDOMINAL HYSTERECTOMY    . TONSILLECTOMY    . TOOTH EXTRACTION    . TRIGGER FINGER RELEASE     Social History   Occupational History  . Not on file  Tobacco Use  . Smoking status: Never Smoker  . Smokeless tobacco: Never Used  Vaping Use  . Vaping Use: Never used  Substance and Sexual Activity  . Alcohol  use: Yes    Comment: occ  . Drug use: Never  . Sexual activity: Not on file

## 2021-02-08 ENCOUNTER — Other Ambulatory Visit: Payer: Self-pay

## 2021-02-08 DIAGNOSIS — I10 Essential (primary) hypertension: Secondary | ICD-10-CM

## 2021-02-08 DIAGNOSIS — E1149 Type 2 diabetes mellitus with other diabetic neurological complication: Secondary | ICD-10-CM

## 2021-02-08 MED ORDER — GABAPENTIN 100 MG PO CAPS: 100.0000 mg | ORAL_CAPSULE | Freq: Three times a day (TID) | ORAL | 1 refills | Status: DC | PRN

## 2021-02-08 MED ORDER — METOPROLOL SUCCINATE ER 50 MG PO TB24: 50.0000 mg | ORAL_TABLET | Freq: Every day | ORAL | 1 refills | Status: DC

## 2021-02-13 ENCOUNTER — Other Ambulatory Visit: Payer: Self-pay

## 2021-02-13 DIAGNOSIS — I10 Essential (primary) hypertension: Secondary | ICD-10-CM

## 2021-02-13 MED ORDER — POTASSIUM CHLORIDE CRYS ER 20 MEQ PO TBCR: 20.0000 meq | EXTENDED_RELEASE_TABLET | Freq: Every day | ORAL | 1 refills | Status: DC

## 2021-02-13 MED ORDER — AMLODIPINE BESYLATE 10 MG PO TABS: 10.0000 mg | ORAL_TABLET | Freq: Every day | ORAL | 1 refills | Status: DC

## 2021-03-01 DIAGNOSIS — G4733 Obstructive sleep apnea (adult) (pediatric): Secondary | ICD-10-CM | POA: Diagnosis not present

## 2021-04-15 DIAGNOSIS — Z01419 Encounter for gynecological examination (general) (routine) without abnormal findings: Secondary | ICD-10-CM | POA: Diagnosis not present

## 2021-05-03 ENCOUNTER — Telehealth: Payer: Self-pay | Admitting: *Deleted

## 2021-05-03 NOTE — Telephone Encounter (Signed)
Name and DOB verified Reminded to return Cologuard sample.

## 2021-05-06 ENCOUNTER — Telehealth: Payer: Self-pay | Admitting: Internal Medicine

## 2021-05-06 MED ORDER — EMPAGLIFLOZIN 25 MG PO TABS
25.0000 mg | ORAL_TABLET | Freq: Every day | ORAL | 6 refills | Status: DC
Start: 2021-05-06 — End: 2021-10-01

## 2021-05-06 NOTE — Addendum Note (Signed)
Addended by: Dorita Sciara on: 05/06/2021 02:25 PM   Modules accepted: Orders

## 2021-05-06 NOTE — Telephone Encounter (Signed)
Please advise 

## 2021-05-06 NOTE — Telephone Encounter (Signed)
Patient called re: Patient states Anastasio Auerbach is no longer covered by [employer]. Patient states the following medications are covered by Patient's insurance:  Chapman Moss  Patient requests a RX for one of the above covered medications be sent asap to:   [facility] [phone] - [place], [address] AT [place] Phone:  [phone]  Fax:  [phone]

## 2021-05-06 NOTE — Telephone Encounter (Signed)
Left detail message Rx have been sent

## 2021-05-09 NOTE — Telephone Encounter (Signed)
Pt is needing a PA for the Jardiance.  Blood sugars have 230 range. Pt is needing to know what to do regarding this matter because she is out of invokana.. Pt is wondering what to take in the meantime. Patient would like a call back

## 2021-05-10 ENCOUNTER — Other Ambulatory Visit: Payer: Self-pay | Admitting: Internal Medicine

## 2021-05-10 DIAGNOSIS — E1165 Type 2 diabetes mellitus with hyperglycemia: Secondary | ICD-10-CM

## 2021-05-10 MED ORDER — GLIPIZIDE 5 MG PO TABS: 10.0000 mg | ORAL_TABLET | Freq: Two times a day (BID) | ORAL | 3 refills | Status: DC

## 2021-05-10 NOTE — Telephone Encounter (Signed)
PA have been submitted. Please advise

## 2021-05-10 NOTE — Telephone Encounter (Signed)
Patient requests to be called at ph# [phone] re: Patient is requesting to be advised on what medication she can take now-PA for Melanie Steele is taking too long and Patient has been out of [place] since 05/07/21. Patient states her blood sugars have been ranging from 200- 230 (Fasting)/300 (after meal).

## 2021-05-10 NOTE — Telephone Encounter (Signed)
Need she needs to increase her glipizide to 2 tablets before breakfast and 2 tablets before supper

## 2021-05-10 NOTE — Telephone Encounter (Signed)
Spoken to patient and notified Dr Shamleffer's comments. Verbalized understanding.   

## 2021-05-12 ENCOUNTER — Other Ambulatory Visit: Payer: Self-pay | Admitting: Internal Medicine

## 2021-05-12 DIAGNOSIS — I10 Essential (primary) hypertension: Secondary | ICD-10-CM

## 2021-05-17 ENCOUNTER — Other Ambulatory Visit: Payer: Self-pay | Admitting: *Deleted

## 2021-05-17 DIAGNOSIS — I10 Essential (primary) hypertension: Secondary | ICD-10-CM

## 2021-05-17 MED ORDER — METOPROLOL SUCCINATE ER 50 MG PO TB24: ORAL_TABLET | ORAL | 1 refills | Status: DC

## 2021-05-28 ENCOUNTER — Encounter: Payer: Self-pay | Admitting: Internal Medicine

## 2021-05-28 ENCOUNTER — Ambulatory Visit: Payer: BC Managed Care – PPO | Admitting: Internal Medicine

## 2021-05-28 ENCOUNTER — Other Ambulatory Visit: Payer: Self-pay

## 2021-05-28 VITALS — BP 135/62 | HR 84 | Ht 65.0 in | Wt 220.0 lb

## 2021-05-28 DIAGNOSIS — E1165 Type 2 diabetes mellitus with hyperglycemia: Secondary | ICD-10-CM

## 2021-05-28 DIAGNOSIS — E1149 Type 2 diabetes mellitus with other diabetic neurological complication: Secondary | ICD-10-CM

## 2021-05-28 DIAGNOSIS — E890 Postprocedural hypothyroidism: Secondary | ICD-10-CM | POA: Diagnosis not present

## 2021-05-28 LAB — POCT GLYCOSYLATED HEMOGLOBIN (HGB A1C): Hemoglobin A1C: 9.4 % — AB (ref 4.0–5.6)

## 2021-05-28 MED ORDER — TRULICITY 3 MG/0.5ML ~~LOC~~ SOAJ
3.0000 mg | SUBCUTANEOUS | 3 refills | Status: DC
Start: 2021-05-28 — End: 2022-02-04
  Filled 2021-12-06: qty 2, 28d supply, fill #0
  Filled 2022-01-09: qty 2, 28d supply, fill #1

## 2021-05-28 MED ORDER — GLIPIZIDE 5 MG PO TABS: 5.0000 mg | ORAL_TABLET | Freq: Every day | ORAL | 3 refills | Status: DC

## 2021-05-28 NOTE — Progress Notes (Signed)
Name: Melanie Steele  Age/ Sex: 60 y.o., female   MRN/ DOB: [id number], 08/28/1961     PCP: Nicolette Bang, MD   Reason for Endocrinology Evaluation: Type 2 Diabetes Mellitus  Initial Endocrine Consultative Visit: 08/21/2021    PATIENT IDENTIFIER: Melanie Steele is a 60 y.o. female with a past medical history of T2DM, OSA, HTN and Dyslipidemia. The patient has followed with Endocrinology clinic since 08/21/2020 for consultative assistance with management of her diabetes.  DIABETIC HISTORY:  Ms. Downs was diagnosed with DM many years ago. Metformin-  Tearing stomach   Her hemoglobin A1c has ranged from 7.9% in 2020, peaking at 9.8% in 2021.  On her initial visit to our clinic she had an A1c 9.8%, she was on Invokana and Trulicity, we started Glipizide      THYROID HISTORY: Has been diagnosed with hyperthyroidism in 1993 secondary to Graves' disease . She is S/P RAI ablation, followed by LT- 4 replacement. She was on levothyroxine initially and switched to armour a few years ago ( ~ 2019) . She has been having issues with adjusting her levels.  By 08/2021 we switched from Armour to Levothyroxine      Father with thyroid disease    SUBJECTIVE:   During the last visit (01/22/2021): A1c 9.6% . We increased Glipizide, continued Trulicity and Invokana    Today (05/28/2021): [name] is here for a follow up on diabetes and hypothyroidism.  She checks her blood sugars multiple times daily. The patient has  had hypoglycemic episodes since the last clinic visit, which she attributes to not eating any carbs one day.    Denies nausea and diarrhea  Weight has been stable  Denies constipation or diarrhea     HOME DIABETES REGIMEN:  Glipizide 5 mg , 1 tab BID, taking 1 tablet before supper - has been back and forth with taking it in the morning at night and 1 after supper  Jardiance 25 mg ,1 tablet in the morning - has been taking it at night  Trulicity 1.5 mg weekly  Levothyroxine  150 mcg daily     Statin: yes ACE-I/ARB: no    CONTINUOUS GLUCOSE MONITORING RECORD INTERPRETATION    Dates of Recording: 6/1-6/14/2022  Sensor description: [employer]  Results statistics:   CGM use % of time 64  Average and SD 192/23.2  Time in range    48 %  % Time Above 180 41  % Time above 250 11  % Time Below target 0    Glycemic patterns summary: Hyperglycemia during the day , within goal overnight   Hyperglycemic episodes  Postprandial   Hypoglycemic episodes occurred N/A  Overnight periods:  Trends down to just below 180 mg/dL       DIABETIC COMPLICATIONS: Microvascular complications:   Denies: CKD, retinopathy, neuropathy  Last Eye Exam: Completed 2020  Macrovascular complications:   Denies: CAD, CVA, PVD   HISTORY:  Past Medical History:  Past Medical History:  Diagnosis Date   CPAP (continuous positive airway pressure) dependence    Hyperlipidemia    Hypertension    Sleep apnea    Type 2 diabetes mellitus ([place])    Vitamin D deficiency    Past Surgical History:  Past Surgical History:  Procedure Laterality Date   ABDOMINAL HYSTERECTOMY     TONSILLECTOMY     TOOTH EXTRACTION     TRIGGER FINGER RELEASE     Social History:  reports that she has never smoked. She has never  used smokeless tobacco. She reports current alcohol use. She reports that she does not use drugs. Family History:  Family History  Problem Relation Age of Onset   Diabetes Mother    Hypertension Mother    Stroke Father    Hypertension Sister      HOME MEDICATIONS: Allergies as of 05/28/2021       Reactions   Lisinopril Anaphylaxis   Other Anaphylaxis   Tree nuts   Linagliptin Other (See Comments)   Upset stomach   Metformin And Related Diarrhea        Medication List        Accurate as of May 28, 2021 10:24 AM. If you have any questions, ask your nurse or doctor.          amLODipine 10 MG tablet Commonly known as: NORVASC Take 1 tablet  (10 mg total) by mouth daily.   empagliflozin 25 MG Tabs tablet Commonly known as: Jardiance Take 1 tablet (25 mg total) by mouth daily before breakfast.   EPINEPHrine 0.3 mg/0.3 mL Soaj injection Commonly known as: EPI-PEN Inject 0.3 mg IM as needed for allergic reaction   fexofenadine 180 MG tablet Commonly known as: ALLEGRA Take 1 tablet by mouth every other day.   fluticasone 50 MCG/ACT nasal spray Commonly known as: FLONASE Place into both nostrils 2 (two) times daily.   FreeStyle [employer] Apply one sensor every 14 days. Dx: E11.49   gabapentin 100 MG capsule Commonly known as: NEURONTIN Take 1 capsule (100 mg total) by mouth 3 (three) times daily as needed.   glipiZIDE 5 MG tablet Commonly known as: GLUCOTROL Take 2 tablets (10 mg total) by mouth 2 (two) times daily before a meal.   hydrochlorothiazide 25 MG tablet Commonly known as: HYDRODIURIL Take 1 tablet (25 mg total) by mouth every other day.   levothyroxine 150 MCG tablet Commonly known as: SYNTHROID Take 1 tablet (150 mcg total) by mouth daily.   metoprolol succinate 50 MG 24 hr tablet Commonly known as: TOPROL-XL TAKE 1 TABLET(50 MG) BY MOUTH DAILY   multivitamin tablet Take 1 tablet by mouth daily.   OneTouch Verio test strip Generic drug: glucose blood Daily   potassium chloride SA 20 MEQ tablet Commonly known as: KLOR-CON Take 1 tablet (20 mEq total) by mouth daily.   rosuvastatin 5 MG tablet Commonly known as: CRESTOR Take 1 tablet (5 mg total) by mouth daily.   Trulicity 1.5 KP/5.4SF Sopn Generic drug: Dulaglutide Inject 1.5 mg into the skin once a week.         OBJECTIVE:   Vital Signs: BP 135/62   Pulse 84   Ht 5\' 5"  (1.651 m)   Wt 220 lb (99.8 kg)   SpO2 98%   BMI 36.61 kg/m   Wt Readings from Last 3 Encounters:  05/28/21 220 lb (99.8 kg)  01/22/21 222 lb (100.7 kg)  01/18/21 223 lb 3.2 oz (101.2 kg)     Exam: General: Pt appears well and is in  NAD  Lungs: Clear with good BS bilat with no rales, rhonchi, or wheezes  Heart: RRR with normal S1 and S2 and no gallops; no murmurs; no rub  Abdomen: Normoactive bowel sounds, soft, nontender, without masses or organomegaly palpable  Extremities: No pretibial edema.   Neuro: MS is good with appropriate affect, pt is alert and Ox3    DM foot exam: 05/28/2021   The skin of the feet is intact without sores or ulcerations. The  pedal pulses are 2+ on right and 2+ on left. The sensation is intact to a screening 5.07, 10 gram monofilament bilaterally    DATA REVIEWED:  Lab Results  Component Value Date   HGBA1C 9.4 (A) 05/28/2021   HGBA1C 9.6 (A) 01/18/2021   HGBA1C 9.8 (A) 07/17/2020   Lab Results  Component Value Date   LDLCALC 111 (H) 07/17/2020   CREATININE 0.64 04/16/2020   Lab Results  Component Value Date   MICRALBCREAT 12 07/17/2020     Lab Results  Component Value Date   CHOL 188 07/17/2020   HDL 57 07/17/2020   LDLCALC 111 (H) 07/17/2020   TRIG 115 07/17/2020   CHOLHDL 3.3 07/17/2020       Results for KAYDI, KLEY (MRN [id number]) as of 01/23/2021 07:34  Ref. Range 01/22/2021 14:02  TSH Latest Ref Range: 0.35 - 4.50 uIU/mL 3.40    ASSESSMENT / PLAN / RECOMMENDATIONS:   1) Type 2 Diabetes Mellitus, Poorly controlled, Without complications - Most recent A1c of 9.4 %. Goal A1c < 7.0 %.   - A1c stable but above goal. She had mixed up her medications due to insurance interruption of Invokana and has been taking less glipizide then prescribed  - Will make the following adjustments     MEDICATIONS: - Change Glipizide 5 mg , to 1 tablet before Supper  - Continue Jardiance 25 mg daily  - Increase  Trulicity 3 mg weekly     EDUCATION / INSTRUCTIONS: BG monitoring instructions: Patient is instructed to check her blood sugars 3 times a day, before meals  Call [facility] if: BG persistently < 70  I reviewed the Rule of 15 for the treatment of  hypoglycemia in detail with the patient. Literature supplied.    2) Diabetic complications:  Eye: Does not have known diabetic retinopathy.  Neuro/ Feet: Does not have known diabetic peripheral neuropathy .  Renal: Patient does not have known baseline CKD. She   is not  on an ACEI/ARB at present.     3) Postablative Hypothyroidism:   - No local neck symptoms  -- She is taking it appropriately   - TSH is normal    Medication:   Continue Levothyroxine 150 mcg daily      F/U in 4 months    Signed electronically by: Mack Guise, MD  [facility]  [facility] [address] [place], [place] [address] Phone: [phone] FAX: [phone]   CC: Nicolette Bang, [address], [place] [address] Phone: [phone]  Fax: [phone]  Return to Endocrinology clinic as below: No future appointments.

## 2021-05-28 NOTE — Patient Instructions (Addendum)
-   Change Glipizide 5 mg , 1 tablet before Supper  - Continue Jardiance 25 mg  - Increase Trulicity 3 mg weekly    - Continue Levothyroxine 150 mcg daily for now     HOW TO TREAT LOW BLOOD SUGARS (Blood sugar LESS THAN 70 MG/DL) Please follow the RULE OF 15 for the treatment of hypoglycemia treatment (when your (blood sugars are less than 70 mg/dL)   STEP 1: Take 15 grams of carbohydrates when your blood sugar is low, which includes:  3-4 GLUCOSE TABS  OR 3-4 OZ OF JUICE OR REGULAR SODA OR ONE TUBE OF GLUCOSE GEL    STEP 2: RECHECK blood sugar in 15 MINUTES STEP 3: If your blood sugar is still low at the 15 minute recheck --> then, go back to STEP 1 and treat AGAIN with another 15 grams of carbohydrates.

## 2021-07-12 ENCOUNTER — Telehealth: Payer: Self-pay | Admitting: Internal Medicine

## 2021-07-12 DIAGNOSIS — I10 Essential (primary) hypertension: Secondary | ICD-10-CM

## 2021-07-12 MED ORDER — HYDROCHLOROTHIAZIDE 25 MG PO TABS: 25.0000 mg | ORAL_TABLET | Freq: Every day | ORAL | 2 refills | Status: DC

## 2021-07-12 NOTE — Telephone Encounter (Signed)
Please advise 

## 2021-07-12 NOTE — Telephone Encounter (Signed)
RX REFILL -   hydrochlorothiazide (HYDRODIURIL) 25 MG - Patient states Dr Juleen China has left the practice so she is between PCPs right now. She is almost out of medication and did not know what to do to get it filled.  PHARMACY -   [facility] [phone] - [place], [address] AT [place] Phone:  [phone]  Fax:  [phone]

## 2021-07-12 NOTE — Telephone Encounter (Signed)
Left message that request have been sent to pharmacy

## 2021-08-15 ENCOUNTER — Other Ambulatory Visit: Payer: Self-pay | Admitting: Internal Medicine

## 2021-08-15 ENCOUNTER — Encounter: Payer: Self-pay | Admitting: Nurse Practitioner

## 2021-08-15 ENCOUNTER — Encounter: Payer: Self-pay | Admitting: Family

## 2021-08-15 ENCOUNTER — Encounter: Payer: Self-pay | Admitting: Internal Medicine

## 2021-08-16 ENCOUNTER — Other Ambulatory Visit: Payer: Self-pay

## 2021-08-16 DIAGNOSIS — E1165 Type 2 diabetes mellitus with hyperglycemia: Secondary | ICD-10-CM

## 2021-08-16 MED ORDER — FREESTYLE LIBRE SENSOR SYSTEM MISC: 2 refills | Status: DC

## 2021-08-20 ENCOUNTER — Other Ambulatory Visit: Payer: Self-pay

## 2021-08-20 ENCOUNTER — Ambulatory Visit (INDEPENDENT_AMBULATORY_CARE_PROVIDER_SITE_OTHER): Payer: BC Managed Care – PPO | Admitting: Nurse Practitioner

## 2021-08-20 ENCOUNTER — Encounter: Payer: Self-pay | Admitting: Nurse Practitioner

## 2021-08-20 DIAGNOSIS — I10 Essential (primary) hypertension: Secondary | ICD-10-CM

## 2021-08-20 MED ORDER — AMLODIPINE BESYLATE 10 MG PO TABS: 10.0000 mg | ORAL_TABLET | Freq: Every day | ORAL | 0 refills | Status: DC

## 2021-08-20 MED ORDER — POTASSIUM CHLORIDE CRYS ER 20 MEQ PO TBCR: 20.0000 meq | EXTENDED_RELEASE_TABLET | Freq: Every day | ORAL | 0 refills | Status: DC

## 2021-08-20 MED ORDER — METOPROLOL SUCCINATE ER 50 MG PO TB24: ORAL_TABLET | ORAL | 0 refills | Status: DC

## 2021-08-20 NOTE — Progress Notes (Signed)
Virtual Visit via Telephone Note  I connected with Jeananne Rama on 08/20/21 at  1:30 PM EDT by telephone and verified that I am speaking with the correct person using two identifiers.  Location: Patient: home Provider: office   I discussed the limitations, risks, security and privacy concerns of performing an evaluation and management service by telephone and the availability of in person appointments. I also discussed with the patient that there may be a patient responsible charge related to this service. The patient expressed understanding and agreed to proceed.   History of Present Illness:  Patient presents today for televisit for med refill.  Patient previously seen by Dr. Juleen China.  Patient does have an appointment at the end of this month to establish care with [facility] health internal medicine.  Today she needs her potassium, amlodipine, Metroprolol refilled.  Patient states that her blood pressure has been within normal range.  She states that she is working on diet and exercise.  She states that overall she is doing well with these medications. Denies f/c/s, n/v/d, hemoptysis, PND, chest pain or edema.    Observations/Objective:  Vitals with BMI 05/28/2021 01/22/2021 01/18/2021  Height '5\' 5"'$  '5\' 5"'$  '5\' 5"'$   Weight 220 lbs 222 lbs 223 lbs 3 oz  BMI 36.61 [id number]  Systolic [id number]  Diastolic 62 82 81  Pulse 84 86 89      Assessment and Plan:  Hypertension:  Continue current medications - will send refills for prescriptions requested  Stay active  Heart healthy diet  Follow up as scheduled with new PCP    I discussed the assessment and treatment plan with the patient. The patient was provided an opportunity to ask questions and all were answered. The patient agreed with the plan and demonstrated an understanding of the instructions.   The patient was advised to call back or seek an in-person evaluation if the symptoms worsen or if the condition fails to improve as  anticipated.  I provided 23 minutes of non-face-to-face time during this encounter.   Fenton Foy, NP

## 2021-08-20 NOTE — Patient Instructions (Signed)
Hypertension:  Continue current medications - will send refills for prescriptions requested  Stay active  Heart healthy diet  Follow up as scheduled with new PCP

## 2021-09-06 ENCOUNTER — Telehealth: Payer: Self-pay

## 2021-09-06 NOTE — Telephone Encounter (Signed)
Pt called- she is a patient of Dr. Kelton Pillar- she wanted me make sure she hadn't missed her Armour thyroid for refill- informed that per med list Shamleffer has her on levothyroxine 178mcg daily. She also mentioned last night her pulse rate went up into the 140s- recommended she call her PCP to let them know, she informed that her old PCP office is "[facility]t" and never returned her calls, she is scheduled w/ a new PCP in October. I recommended she keep a watch on her pulse rate and go to urgent care or call her old PCP office in the meantime if her HR continues to be evaluated. Pt verbalized understanding.

## 2021-09-11 NOTE — Telephone Encounter (Signed)
Patient stated during visit that she does have an appointment at the end of this month to establish care with [facility] health internal medicine. Please have them address this medication. She will probably also need her thyroid rechecked through blood work. Thanks.

## 2021-09-12 ENCOUNTER — Ambulatory Visit: Payer: BC Managed Care – PPO | Admitting: Physician Assistant

## 2021-09-23 ENCOUNTER — Ambulatory Visit: Payer: Self-pay | Admitting: Nurse Practitioner

## 2021-09-24 ENCOUNTER — Ambulatory Visit: Payer: BC Managed Care – PPO | Admitting: Nurse Practitioner

## 2021-10-01 ENCOUNTER — Ambulatory Visit (INDEPENDENT_AMBULATORY_CARE_PROVIDER_SITE_OTHER): Payer: BC Managed Care – PPO | Admitting: Internal Medicine

## 2021-10-01 ENCOUNTER — Other Ambulatory Visit: Payer: Self-pay

## 2021-10-01 ENCOUNTER — Encounter: Payer: Self-pay | Admitting: Internal Medicine

## 2021-10-01 VITALS — BP 132/74 | HR 92 | Ht 65.0 in | Wt 221.0 lb

## 2021-10-01 DIAGNOSIS — E785 Hyperlipidemia, unspecified: Secondary | ICD-10-CM

## 2021-10-01 DIAGNOSIS — E039 Hypothyroidism, unspecified: Secondary | ICD-10-CM

## 2021-10-01 DIAGNOSIS — E1165 Type 2 diabetes mellitus with hyperglycemia: Secondary | ICD-10-CM | POA: Diagnosis not present

## 2021-10-01 LAB — MICROALBUMIN / CREATININE URINE RATIO
Creatinine,U: 45.8 mg/dL
Microalb Creat Ratio: 1.5 mg/g (ref 0.0–30.0)
Microalb, Ur: <0.7 mg/dL (ref 0.0–1.9)

## 2021-10-01 LAB — BASIC METABOLIC PANEL
BUN: 13 mg/dL (ref 6–23)
CO2: 26 mEq/L (ref 19–32)
Calcium: 9.6 mg/dL (ref 8.4–10.5)
Chloride: 105 mEq/L (ref 96–112)
Creatinine, Ser: 0.51 mg/dL (ref 0.40–1.20)
GFR: 101.95 mL/min (ref >60.00)
Glucose, Bld: 147 mg/dL — ABNORMAL HIGH (ref 70–99)
Potassium: 4.4 mEq/L (ref 3.5–5.1)
Sodium: 141 mEq/L (ref 135–145)

## 2021-10-01 LAB — LIPID PANEL
Cholesterol: 193 mg/dL (ref 0–200)
HDL: 56.8 mg/dL (ref >39.00)
LDL Cholesterol: 108 mg/dL — ABNORMAL HIGH (ref 0–99)
NonHDL: 136.37
Total CHOL/HDL Ratio: 3
Triglycerides: 140 mg/dL (ref 0.0–149.0)
VLDL: 28 mg/dL (ref 0.0–40.0)

## 2021-10-01 LAB — POCT GLYCOSYLATED HEMOGLOBIN (HGB A1C): Hemoglobin A1C: 9.3 % — AB (ref 4.0–5.6)

## 2021-10-01 LAB — TSH: TSH: 7.34 u[IU]/mL — ABNORMAL HIGH (ref 0.35–5.50)

## 2021-10-01 MED ORDER — EMPAGLIFLOZIN 25 MG PO TABS: 25.0000 mg | ORAL_TABLET | Freq: Every day | ORAL | 3 refills | Status: DC

## 2021-10-01 MED ORDER — GLIPIZIDE 5 MG PO TABS: ORAL_TABLET | ORAL | 3 refills | Status: DC

## 2021-10-01 NOTE — Progress Notes (Signed)
Name: Melanie Steele  Age/ Sex: 60 y.o., female   MRN/ DOB: [id number], 1961-02-08     PCP: Ronnell Freshwater, NP   Reason for Endocrinology Evaluation: Type 2 Diabetes Mellitus  Initial Endocrine Consultative Visit: 08/21/2021    PATIENT IDENTIFIER: [name] is a 60 y.o. female with a past medical history of T2DM, OSA, HTN and Dyslipidemia. The patient has followed with Endocrinology clinic since 08/21/2020 for consultative assistance with management of her diabetes.  DIABETIC HISTORY:  Ms. Sessler was diagnosed with DM many years ago. Metformin-  Tearing stomach   Her hemoglobin A1c has ranged from 7.9% in 2020, peaking at 9.8% in 2021.  On her initial visit to our clinic she had an A1c 9.8%, she was on Invokana and Trulicity, we started Glipizide      THYROID HISTORY: Has been diagnosed with hyperthyroidism in 1993 secondary to Graves' disease . She is S/P RAI ablation, followed by LT- 4 replacement. She was on levothyroxine initially and switched to armour a few years ago ( ~ 2019) . She has been having issues with adjusting her levels.  By 08/2021 we switched from Armour to Levothyroxine      Father with thyroid disease    SUBJECTIVE:   During the last visit (05/28/2021): A1c 9.4% . We continued Glipizide,Jardiance and increased Trulicity    Today (29/93/7169): [name] is here for a follow up on diabetes and hypothyroidism.  She checks her blood sugars multiple times daily through CGM . The patient has  had hypoglycemic episodes since the last clinic visit, which she attributes to not eating any carbs one day.    Denies nausea , vomiting or  diarrhea  Weight has been stable  She has been forgetting her night time tabs   Has been having issues with obtaining freestyle sensors through [employer]:  Glipizide 5 mg , 1 tablet before supper  Jardiance 25 mg ,1 tablet in the morning Trulicity 3 mg weekly  Levothyroxine 150 mcg daily         Statin: yes ACE-I/ARB: no    CONTINUOUS GLUCOSE MONITORING RECORD INTERPRETATION    Dates of Recording: 10/4-10/17/2022  Sensor description: [employer]  Results statistics:   CGM use % of time 19  Average and SD 192/23.4  Time in range   38 %  % Time Above 180 52  % Time above 250 10  % Time Below target 0    Glycemic patterns summary: Limited data with only 19 % use, has been noted with postprandial hyperglycemia , BG's optimal in between   Hyperglycemic episodes  Postprandial   Hypoglycemic episodes occurred N/A  Overnight periods:  Trends down      DIABETIC COMPLICATIONS: Microvascular complications:   Denies: CKD, retinopathy, neuropathy  Last Eye Exam: Completed 09/14/2020  Macrovascular complications:   Denies: CAD, CVA, PVD   HISTORY:  Past Medical History:  Past Medical History:  Diagnosis Date   CPAP (continuous positive airway pressure) dependence    Hyperlipidemia    Hypertension    Sleep apnea    Type 2 diabetes mellitus ([place])    Vitamin D deficiency    Past Surgical History:  Past Surgical History:  Procedure Laterality Date   ABDOMINAL HYSTERECTOMY     TONSILLECTOMY     TOOTH EXTRACTION     TRIGGER FINGER RELEASE     Social History:  reports that she has never smoked. She has never used smokeless tobacco.  She reports current alcohol use. She reports that she does not use drugs. Family History:  Family History  Problem Relation Age of Onset   Diabetes Mother    Hypertension Mother    Stroke Father    Hypertension Sister      HOME MEDICATIONS: Allergies as of 10/01/2021       Reactions   Lisinopril Anaphylaxis   Other Anaphylaxis   Tree nuts   Linagliptin Other (See Comments)   Upset stomach   Metformin And Related Diarrhea        Medication List        Accurate as of October 01, 2021 12:23 PM. If you have any questions, ask your nurse or doctor.          amLODipine 10 MG tablet Commonly known  as: NORVASC Take 1 tablet (10 mg total) by mouth daily.   empagliflozin 25 MG Tabs tablet Commonly known as: Jardiance Take 1 tablet (25 mg total) by mouth daily before breakfast.   EPINEPHrine 0.3 mg/0.3 mL Soaj injection Commonly known as: EPI-PEN Inject 0.3 mg IM as needed for allergic reaction   fexofenadine 180 MG tablet Commonly known as: ALLEGRA Take 1 tablet by mouth every other day.   fluticasone 50 MCG/ACT nasal spray Commonly known as: FLONASE Place into both nostrils 2 (two) times daily.   FreeStyle [employer] Apply one sensor every 14 days. Dx: E11.49   gabapentin 100 MG capsule Commonly known as: NEURONTIN Take 1 capsule (100 mg total) by mouth 3 (three) times daily as needed.   glipiZIDE 5 MG tablet Commonly known as: GLUCOTROL Take 0.5 tablets (2.5 mg total) by mouth daily before breakfast AND 1 tablet (5 mg total) daily before supper. What changed: See the new instructions. Changed by: Dorita Sciara, MD   hydrochlorothiazide 25 MG tablet Commonly known as: HYDRODIURIL Take 1 tablet (25 mg total) by mouth daily.   levothyroxine 150 MCG tablet Commonly known as: SYNTHROID TAKE 1 TABLET(150 MCG) BY MOUTH DAILY   metoprolol succinate 50 MG 24 hr tablet Commonly known as: TOPROL-XL TAKE 1 TABLET(50 MG) BY MOUTH DAILY   multivitamin tablet Take 1 tablet by mouth daily.   OneTouch Verio test strip Generic drug: glucose blood Daily   potassium chloride SA 20 MEQ tablet Commonly known as: KLOR-CON Take 1 tablet (20 mEq total) by mouth daily.   rosuvastatin 5 MG tablet Commonly known as: CRESTOR Take 1 tablet (5 mg total) by mouth daily.   Trulicity 3 QA/8.3MH Sopn Generic drug: Dulaglutide Inject 3 mg as directed once a week.         OBJECTIVE:   Vital Signs: BP 132/74 (BP Location: Left Arm, Patient Position: Sitting, Cuff Size: Large)   Pulse 92   Ht 5\' 5"  (1.651 m)   Wt 221 lb (100.2 kg)   SpO2 98%   BMI 36.78  kg/m   Wt Readings from Last 3 Encounters:  10/01/21 221 lb (100.2 kg)  05/28/21 220 lb (99.8 kg)  01/22/21 222 lb (100.7 kg)     Exam: General: Pt appears well and is in NAD  Lungs: Clear with good BS bilat with no rales, rhonchi, or wheezes  Heart: RRR with normal S1 and S2 and no gallops; no murmurs; no rub  Abdomen: Normoactive bowel sounds, soft, nontender, without masses or organomegaly palpable  Extremities: No pretibial edema.   Neuro: MS is good with appropriate affect, pt is alert and Ox3    DM  foot exam: 05/28/2021   The skin of the feet is intact without sores or ulcerations. The pedal pulses are 2+ on right and 2+ on left. The sensation is intact to a screening 5.07, 10 gram monofilament bilaterally    DATA REVIEWED:  Lab Results  Component Value Date   HGBA1C 9.3 (A) 10/01/2021   HGBA1C 9.4 (A) 05/28/2021   HGBA1C 9.6 (A) 01/18/2021    Results for [employer] (MRN [id number]) as of 10/02/2021 14:41  Ref. Range 10/01/2021 10:48  Sodium Latest Ref Range: 135 - 145 mEq/L 141  Potassium Latest Ref Range: 3.5 - 5.1 mEq/L 4.4  Chloride Latest Ref Range: 96 - 112 mEq/L 105  CO2 Latest Ref Range: 19 - 32 mEq/L 26  Glucose Latest Ref Range: 70 - 99 mg/dL 147 (H)  BUN Latest Ref Range: 6 - 23 mg/dL 13  Creatinine Latest Ref Range: 0.40 - 1.20 mg/dL 0.51  Calcium Latest Ref Range: 8.4 - 10.5 mg/dL 9.6  GFR Latest Ref Range: >60.00 mL/min 101.95  Total CHOL/HDL Ratio Unknown 3  Cholesterol Latest Ref Range: 0 - 200 mg/dL 193  HDL Cholesterol Latest Ref Range: >39.00 mg/dL 56.80  LDL (calc) Latest Ref Range: 0 - 99 mg/dL 108 (H)  MICROALB/CREAT RATIO Latest Ref Range: 0.0 - 30.0 mg/g 1.5  NonHDL Unknown 136.37  Triglycerides Latest Ref Range: 0.0 - 149.0 mg/dL 140.0  VLDL Latest Ref Range: 0.0 - 40.0 mg/dL 28.0  TSH Latest Ref Range: 0.35 - 5.50 uIU/mL 7.34 (H)  Creatinine,U Latest Units: mg/dL 45.8  Microalb, Ur Latest Ref Range: 0.0 - 1.9 mg/dL <0.7     ASSESSMENT / PLAN / RECOMMENDATIONS:   1) Type 2 Diabetes Mellitus, Poorly controlled, Without complications - Most recent A1c of 9.3 %. Goal A1c < 7.0 %.   - A1c stable but continues to be above goal, despite doubling up on Trulicity  -I have again emphasized the importance of low-carb diet, which she admits she has an issue with, she avoids sugar sweetened beverages  -We will add half a tablet of glipizide before breakfast and continue 1 tablet of glipizide before supper, she is already maxed out on Jardiance, she is already on high dose of Trulicity, but we will not increase the dose at this time, as I am concerned this will increase her risk of side effects and I doubt that this will bring her A1c to target   MEDICATIONS: - Change Glipizide 5 mg , to half a tablet before breakfast and 1 tablet before Supper  - Continue Jardiance 25 mg daily  - Continue Trulicity 3 mg weekly     EDUCATION / INSTRUCTIONS: BG monitoring instructions: Patient is instructed to check her blood sugars 3 times a day, before meals  Call [facility] if: BG persistently < 70  I reviewed the Rule of 15 for the treatment of hypoglycemia in detail with the patient. Literature supplied.    2) Diabetic complications:  Eye: Does not have known diabetic retinopathy.  Neuro/ Feet: Does not have known diabetic peripheral neuropathy .  Renal: Patient does not have known baseline CKD. She   is not  on an ACEI/ARB at present.     3) Postablative Hypothyroidism:   - No local neck symptoms  -- She is taking it appropriately   - TSH is is elevated, will make the following changes   Medication:    Levothyroxine 150 mcg, 2 tablets on Sundays, 1 tablet rest of the week  4) Dyslipidemia:   -LDL is above goal, will increase rosuvastatin as below    Medication Increase rosuvastatin from 5 mg to 10 mg daily   F/U in 4 months    Signed electronically by: Mack Guise,  MD  [facility]  [facility] [address] [place], [place] [address] Phone: [phone] FAX: [phone]   CC: Ronnell Freshwater, NP [place] [address] Phone: [phone]  Fax: [phone]  Return to Endocrinology clinic as below: Future Appointments  Date Time Provider [place]  10/02/2021  4:10 PM Ronnell Freshwater, NP [facility] None  02/04/2022  8:50 AM Heyward Douthit, Melanie Crazier, MD [id number]

## 2021-10-01 NOTE — Patient Instructions (Signed)
-   Change Glipizide 5 mg ,Half a tablet before Breakfast and  1 tablet before Supper  - Continue Jardiance 25 mg  - Continue Trulicity 3 mg weekly    - Continue Levothyroxine 150 mcg daily     HOW TO TREAT LOW BLOOD SUGARS (Blood sugar LESS THAN 70 MG/DL) Please follow the RULE OF 15 for the treatment of hypoglycemia treatment (when your (blood sugars are less than 70 mg/dL)   STEP 1: Take 15 grams of carbohydrates when your blood sugar is low, which includes:  3-4 GLUCOSE TABS  OR 3-4 OZ OF JUICE OR REGULAR SODA OR ONE TUBE OF GLUCOSE GEL    STEP 2: RECHECK blood sugar in 15 MINUTES STEP 3: If your blood sugar is still low at the 15 minute recheck --> then, go back to STEP 1 and treat AGAIN with another 15 grams of carbohydrates.

## 2021-10-02 ENCOUNTER — Encounter: Payer: Self-pay | Admitting: Nurse Practitioner

## 2021-10-02 ENCOUNTER — Ambulatory Visit (INDEPENDENT_AMBULATORY_CARE_PROVIDER_SITE_OTHER): Payer: BC Managed Care – PPO | Admitting: Nurse Practitioner

## 2021-10-02 VITALS — BP 133/72 | HR 95 | Temp 98.1°F | Ht 65.0 in | Wt 219.9 lb

## 2021-10-02 DIAGNOSIS — Z7689 Persons encountering health services in other specified circumstances: Secondary | ICD-10-CM | POA: Diagnosis not present

## 2021-10-02 DIAGNOSIS — Z6836 Body mass index (BMI) 36.0-36.9, adult: Secondary | ICD-10-CM | POA: Diagnosis not present

## 2021-10-02 DIAGNOSIS — R000 Tachycardia, unspecified: Secondary | ICD-10-CM | POA: Diagnosis not present

## 2021-10-02 DIAGNOSIS — I10 Essential (primary) hypertension: Secondary | ICD-10-CM

## 2021-10-02 DIAGNOSIS — R0789 Other chest pain: Secondary | ICD-10-CM | POA: Diagnosis not present

## 2021-10-02 DIAGNOSIS — E1149 Type 2 diabetes mellitus with other diabetic neurological complication: Secondary | ICD-10-CM

## 2021-10-02 MED ORDER — ROSUVASTATIN CALCIUM 10 MG PO TABS: 10.0000 mg | ORAL_TABLET | Freq: Every day | ORAL | 3 refills | Status: DC

## 2021-10-02 MED ORDER — LEVOTHYROXINE SODIUM 150 MCG PO TABS: 150.0000 ug | ORAL_TABLET | ORAL | 3 refills | Status: DC

## 2021-10-02 NOTE — Progress Notes (Signed)
New Patient Office Visit  Subjective:  Patient ID: Melanie Steele, female    DOB: [birth year]  Age: 60 y.o. MRN: [id number]  CC:  Chief Complaint  Patient presents with   New Patient (Initial Visit)    HPI Melanie Steele presents to establish new primary care provider. She has concerns related to living alone. She has a small dog who is 52 years old. She states that she has never lived alone before. She states that she moved to  to be closer to her dad after her mom passed away. Last week, had episode of rapid heart rate. She believes tied to anxiety and worry. Heart rate was very elevated. Heart rate was 157. Did cause her to have some tightness in he her chest. States that she was on the phone with her dad when this happened. She states that she has hada few other episodes like this in the past. Onc episode every few years. 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 Has not had echocardiogram in some time.  She states that she has had several episodes of trigger finger abnormalities. Has had three surgeries to correct this in the past. 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 She has seen hand specialist in the past. If this becomes problematic, would like to potentially see new hand specialist.   Past Medical History:  Diagnosis Date   CPAP (continuous positive airway pressure) dependence    Hyperlipidemia    Hypertension    Sleep apnea    Type 2 diabetes mellitus ([place])    Vitamin D deficiency     Past Surgical History:  Procedure Laterality Date   ABDOMINAL HYSTERECTOMY     TONSILLECTOMY     TOOTH EXTRACTION     TRIGGER FINGER RELEASE      Family History  Problem Relation Age of Onset   Diabetes Mother    Hypertension Mother    Stroke Father    Hypertension Sister     Social History   Socioeconomic History   Marital status: Widowed    Spouse name: Not on file    Number of children: Not on file   Years of education: Not on file   Highest education level: Not on file  Occupational History   Not on file  Tobacco Use   Smoking status: Never   Smokeless tobacco: Never  Vaping Use   Vaping Use: Never used  Substance and Sexual Activity   Alcohol use: Yes    Comment: occ   Drug use: Never   Sexual activity: Not Currently  Other Topics Concern   Not on file  Social History Narrative   Not on file   Social Determinants of Health   Financial Resource Strain: Not on file  Food Insecurity: Not on file  Transportation Needs: Not on file  Physical Activity: Not on file  Stress: Not on file  Social Connections: Not on file  Intimate Partner Violence: Not on file    ROS Review of Systems  Constitutional:  Negative for activity change, appetite change, chills, fatigue and fever.  HENT:  Negative for congestion, postnasal drip, rhinorrhea, sinus pressure, sinus pain, sneezing and sore throat.   Eyes: Negative.   Respiratory:  Positive for chest tightness and shortness of breath. Negative for cough and wheezing.   Cardiovascular:  Positive for palpitations. Negative for chest pain.  Had an episode recently of rapid heart rate for no reason.  Heart rate was recorded at 157.  Chest tightness and shortness of breath.  Lasted for several minutes and gradually resolved on its own.  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  Has not been evaluated for some time.  Gastrointestinal:  Negative for abdominal pain, constipation, diarrhea, nausea and vomiting.  Endocrine: Negative for cold intolerance, heat intolerance, polydipsia and polyuria.       Elevated blood sugars.  Patient does see endocrinology.  Most recent hemoglobin A1c done yesterday was 9.3.  Genitourinary:  Negative for dyspareunia, dysuria, flank pain, frequency and urgency.  Musculoskeletal:  Positive for arthralgias. Negative for back  pain and myalgias.       Trigger finger abnormality of several fingers.  Skin:  Negative for rash.  Allergic/Immunologic: Negative for environmental allergies.  Neurological:  Negative for dizziness, weakness and headaches.  Hematological:  Negative for adenopathy.  Psychiatric/Behavioral:  The patient is nervous/anxious.    Objective:   Today's Vitals   10/02/21 1615  BP: 133/72  Pulse: 95  Temp: 98.1 F (36.7 C)  SpO2: 96%  Weight: 219 lb 14.4 oz (99.7 kg)  Height: 5\' 5"  (1.651 m)   Body mass index is 36.59 kg/m.   Physical Exam Vitals and nursing note reviewed.  Constitutional:      Appearance: Normal appearance. She is well-developed. She is obese.  HENT:     Head: Normocephalic and atraumatic.     Nose: Nose normal.     Mouth/Throat:     Mouth: Mucous membranes are moist.     Pharynx: Oropharynx is clear.  Eyes:     Extraocular Movements: Extraocular movements intact.     Conjunctiva/sclera: Conjunctivae normal.     Pupils: Pupils are equal, round, and reactive to light.  Cardiovascular:     Rate and Rhythm: Normal rate and regular rhythm.     Pulses: Normal pulses.     Heart sounds: Normal heart sounds.  Pulmonary:     Effort: Pulmonary effort is normal.     Breath sounds: Normal breath sounds.  Abdominal:     Palpations: Abdomen is soft.  Musculoskeletal:        General: Normal range of motion.     Cervical back: Normal range of motion and neck supple.  Lymphadenopathy:     Cervical: No cervical adenopathy.  Skin:    General: Skin is warm and dry.     Capillary Refill: Capillary refill takes less than 2 seconds.  Neurological:     General: No focal deficit present.     Mental Status: She is alert and oriented to person, place, and time.  Psychiatric:        Mood and Affect: Mood normal.        Behavior: Behavior normal.        Thought Content: Thought content normal.        Judgment: Judgment normal.    Assessment & Plan:  1. Encounter to  establish care Appointment today to establish new primary care provider    2. Tachycardia Patient with recent episode tachycardia with reported heart rate up to 157.  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  We will get echocardiogram for further evaluation. - ECHOCARDIOGRAM COMPLETE; Future  3. Chest tightness Chest tightness which accompanied tachycardia recently.  We will get echocardiogram  for further evaluation. - ECHOCARDIOGRAM COMPLETE; Future  4. Body mass index (BMI) of 36.0-36.9 in adult Discussed lowering calorie intake to 1500 calories per day and incorporating exercise into daily routine to help lose weight. Will monitor.   5. Type 2 diabetes mellitus with other neurologic complication, without long-term current use of insulin ([place]) Patient's most recent hemoglobin A1c done 10/01/2021 was 9.3.  Patient to continue regular visits with endocrinologist as scheduled.  6. Essential hypertension Blood pressure stable.  Continue medication as prescribed.  Problem List Items Addressed This Visit       Cardiovascular and Mediastinum   Essential hypertension     Endocrine   Diabetes mellitus ([place])     Other   Encounter to establish care - Primary   Tachycardia   Relevant Orders   ECHOCARDIOGRAM COMPLETE   Chest tightness   Relevant Orders   ECHOCARDIOGRAM COMPLETE   Body mass index (BMI) of 36.0-36.9 in adult    Outpatient Encounter Medications as of 10/02/2021  Medication Sig   amLODipine (NORVASC) 10 MG tablet Take 1 tablet (10 mg total) by mouth daily.   Continuous Blood Gluc Sensor ([place]) MISC Apply one sensor every 14 days. Dx: E11.49   Dulaglutide (TRULICITY) 3 HF/0.2OV SOPN Inject 3 mg as directed once a week.   empagliflozin (JARDIANCE) 25 MG TABS tablet Take 1 tablet (25 mg total) by mouth daily before breakfast.   EPINEPHrine 0.3 mg/0.3 mL IJ SOAJ injection Inject 0.3  mg IM as needed for allergic reaction   fexofenadine (ALLEGRA) 180 MG tablet Take 1 tablet by mouth every other day.    fluticasone (FLONASE) 50 MCG/ACT nasal spray Place into both nostrils 2 (two) times daily.   gabapentin (NEURONTIN) 100 MG capsule Take 1 capsule (100 mg total) by mouth 3 (three) times daily as needed.   glipiZIDE (GLUCOTROL) 5 MG tablet Take 0.5 tablets (2.5 mg total) by mouth daily before breakfast AND 1 tablet (5 mg total) daily before supper.   glucose blood (ONETOUCH VERIO) test strip Daily   hydrochlorothiazide (HYDRODIURIL) 25 MG tablet Take 1 tablet (25 mg total) by mouth daily.   levothyroxine (SYNTHROID) 150 MCG tablet Take 1 tablet (150 mcg total) by mouth as directed. Take 2 tablets on Sundays, and 1 tablet the rest of the week   metoprolol succinate (TOPROL-XL) 50 MG 24 hr tablet TAKE 1 TABLET(50 MG) BY MOUTH DAILY   Multiple Vitamin (MULTIVITAMIN) tablet Take 1 tablet by mouth daily.   potassium chloride SA (KLOR-CON) 20 MEQ tablet Take 1 tablet (20 mEq total) by mouth daily.   rosuvastatin (CRESTOR) 10 MG tablet Take 1 tablet (10 mg total) by mouth daily.   No facility-administered encounter medications on file as of 10/02/2021.    Follow-up: Return in about 3 weeks (around 10/23/2021) for health maintenance exam - review echo.   Ronnell Freshwater, NP  This note was dictated using Systems analyst. Rapid proofreading was performed to expedite the delivery of the information. Despite proofreading, phonetic errors will occur which are common with this voice recognition software. Please take this into consideration. If there are any concerns, please contact our office.

## 2021-10-03 DIAGNOSIS — R0789 Other chest pain: Secondary | ICD-10-CM | POA: Insufficient documentation

## 2021-10-03 DIAGNOSIS — Z6836 Body mass index (BMI) 36.0-36.9, adult: Secondary | ICD-10-CM | POA: Insufficient documentation

## 2021-10-03 DIAGNOSIS — Z7689 Persons encountering health services in other specified circumstances: Secondary | ICD-10-CM | POA: Insufficient documentation

## 2021-10-03 DIAGNOSIS — R000 Tachycardia, unspecified: Secondary | ICD-10-CM | POA: Insufficient documentation

## 2021-10-03 DIAGNOSIS — Z6837 Body mass index (BMI) 37.0-37.9, adult: Secondary | ICD-10-CM | POA: Insufficient documentation

## 2021-10-03 NOTE — Patient Instructions (Signed)

## 2021-10-08 ENCOUNTER — Ambulatory Visit (INDEPENDENT_AMBULATORY_CARE_PROVIDER_SITE_OTHER): Payer: BC Managed Care – PPO | Admitting: Nurse Practitioner

## 2021-10-08 ENCOUNTER — Other Ambulatory Visit: Payer: Self-pay

## 2021-10-08 DIAGNOSIS — R0789 Other chest pain: Secondary | ICD-10-CM | POA: Diagnosis not present

## 2021-10-08 DIAGNOSIS — R000 Tachycardia, unspecified: Secondary | ICD-10-CM

## 2021-10-08 NOTE — Progress Notes (Signed)
Reviewed with patient during visit. Echocardiogram ordered.

## 2021-10-18 ENCOUNTER — Other Ambulatory Visit: Payer: Self-pay

## 2021-10-18 ENCOUNTER — Ambulatory Visit (HOSPITAL_COMMUNITY): Payer: BC Managed Care – PPO | Attending: Internal Medicine

## 2021-10-18 DIAGNOSIS — R0789 Other chest pain: Secondary | ICD-10-CM

## 2021-10-18 DIAGNOSIS — R000 Tachycardia, unspecified: Secondary | ICD-10-CM | POA: Insufficient documentation

## 2021-10-18 LAB — ECHOCARDIOGRAM COMPLETE
Area-P 1/2: 4.6 cm2
P 1/2 time: 335 msec
S' Lateral: 2.8 cm

## 2021-10-20 NOTE — Progress Notes (Signed)
Overall, this study looks good. Aortic valve is tricuspid with mild calcification. Will discuss with patient during visit 10/28/2021.

## 2021-10-28 ENCOUNTER — Ambulatory Visit (INDEPENDENT_AMBULATORY_CARE_PROVIDER_SITE_OTHER): Payer: BC Managed Care – PPO | Admitting: Nurse Practitioner

## 2021-10-28 ENCOUNTER — Encounter: Payer: Self-pay | Admitting: Nurse Practitioner

## 2021-10-28 ENCOUNTER — Other Ambulatory Visit: Payer: Self-pay

## 2021-10-28 VITALS — BP 116/69 | HR 95 | Temp 98.2°F | Ht 65.0 in | Wt 223.2 lb

## 2021-10-28 DIAGNOSIS — Z6837 Body mass index (BMI) 37.0-37.9, adult: Secondary | ICD-10-CM

## 2021-10-28 DIAGNOSIS — R0789 Other chest pain: Secondary | ICD-10-CM | POA: Diagnosis not present

## 2021-10-28 DIAGNOSIS — Z23 Encounter for immunization: Secondary | ICD-10-CM

## 2021-10-28 DIAGNOSIS — R000 Tachycardia, unspecified: Secondary | ICD-10-CM

## 2021-10-28 DIAGNOSIS — Z0001 Encounter for general adult medical examination with abnormal findings: Secondary | ICD-10-CM

## 2021-10-28 DIAGNOSIS — Z91018 Allergy to other foods: Secondary | ICD-10-CM

## 2021-10-28 DIAGNOSIS — E1149 Type 2 diabetes mellitus with other diabetic neurological complication: Secondary | ICD-10-CM

## 2021-10-28 DIAGNOSIS — Z1231 Encounter for screening mammogram for malignant neoplasm of breast: Secondary | ICD-10-CM

## 2021-10-28 MED ORDER — EPINEPHRINE 0.3 MG/0.3ML IJ SOAJ: INTRAMUSCULAR | 1 refills | Status: DC

## 2021-10-28 NOTE — Progress Notes (Signed)
Established Patient Office Visit  Subjective:  Patient ID: Melanie Steele, female    DOB: 24-Nov-1961  Age: 60 y.o. MRN: [id number]  CC:  Chief Complaint  Patient presents with   Annual Exam    HPI Melanie Steele presents for annual wellness visit. She continues to have some intermittent episodes of tachycardia. ECG showing left atrial enlargement and possible anterior infarct, age undetermined. Recent Echocardiogram showing mild LVH with aortic valve calcifications and mild regurgitation. Unclear of cause of these episodes. Will refer to cardiology.  She does see endocrinology for diabetes and hypothyroid. TSH was elevated with check 10/01/2021. Patient admits to not taking medications everyday at the same time. Will coitnue ot be monitored. She plans to find eye doctor for needed diabetic eye exam.  She has no new concerns or complaints. She denies chest pain, chest pressure, or shortness of breath. She denies headaches or visual disturbances. She denies abdominal pain, nausea, vomiting, or changes in bowel or bladder habits.     Past Medical History:  Diagnosis Date   CPAP (continuous positive airway pressure) dependence    Hyperlipidemia    Hypertension    Sleep apnea    Type 2 diabetes mellitus (HCC)    Vitamin D deficiency     Past Surgical History:  Procedure Laterality Date   ABDOMINAL HYSTERECTOMY     TONSILLECTOMY     TOOTH EXTRACTION     TRIGGER FINGER RELEASE      Family History  Problem Relation Age of Onset   Diabetes Mother    Hypertension Mother    Stroke Father    Hypertension Sister     Social History   Socioeconomic History   Marital status: Widowed    Spouse name: Not on file   Number of children: Not on file   Years of education: Not on file   Highest education level: Not on file  Occupational History   Not on file  Tobacco Use   Smoking status: Never   Smokeless tobacco: Never  Vaping Use   Vaping Use: Never used  Substance and  Sexual Activity   Alcohol use: Yes    Comment: occ   Drug use: Never   Sexual activity: Not Currently  Other Topics Concern   Not on file  Social History Narrative   Not on file   Social Determinants of Health   Financial Resource Strain: Not on file  Food Insecurity: Not on file  Transportation Needs: Not on file  Physical Activity: Not on file  Stress: Not on file  Social Connections: Not on file  Intimate Partner Violence: Not on file    Outpatient Medications Prior to Visit  Medication Sig Dispense Refill   amLODipine (NORVASC) 10 MG tablet Take 1 tablet (10 mg total) by mouth daily. 90 tablet 0   Continuous Blood Gluc Sensor ([place]) MISC Apply one sensor every 14 days. Dx: E11.49 6 each 2   Dulaglutide (TRULICITY) 3 XL/2.4MW SOPN Inject 3 mg as directed once a week. 6 mL 3   empagliflozin (JARDIANCE) 25 MG TABS tablet Take 1 tablet (25 mg total) by mouth daily before breakfast. 90 tablet 3   fexofenadine (ALLEGRA) 180 MG tablet Take 1 tablet by mouth every other day.      fluticasone (FLONASE) 50 MCG/ACT nasal spray Place into both nostrils 2 (two) times daily.     gabapentin (NEURONTIN) 100 MG capsule Take 1 capsule (100 mg total) by mouth 3 (three)  times daily as needed. 90 capsule 1   glipiZIDE (GLUCOTROL) 5 MG tablet Take 0.5 tablets (2.5 mg total) by mouth daily before breakfast AND 1 tablet (5 mg total) daily before supper. 135 tablet 3   glucose blood (ONETOUCH VERIO) test strip Daily 150 each 3   hydrochlorothiazide (HYDRODIURIL) 25 MG tablet Take 1 tablet (25 mg total) by mouth daily. 90 tablet 2   levothyroxine (SYNTHROID) 150 MCG tablet Take 1 tablet (150 mcg total) by mouth as directed. Take 2 tablets on Sundays, and 1 tablet the rest of the week 104 tablet 3   metoprolol succinate (TOPROL-XL) 50 MG 24 hr tablet TAKE 1 TABLET(50 MG) BY MOUTH DAILY 90 tablet 0   Multiple Vitamin (MULTIVITAMIN) tablet Take 1 tablet by mouth daily.      potassium chloride SA (KLOR-CON) 20 MEQ tablet Take 1 tablet (20 mEq total) by mouth daily. 90 tablet 0   rosuvastatin (CRESTOR) 10 MG tablet Take 1 tablet (10 mg total) by mouth daily. 90 tablet 3   EPINEPHrine 0.3 mg/0.3 mL IJ SOAJ injection Inject 0.3 mg IM as needed for allergic reaction 1 each 1   No facility-administered medications prior to visit.    Allergies  Allergen Reactions   Lisinopril Anaphylaxis   Other Anaphylaxis    Tree nuts   Linagliptin Other (See Comments)    Upset stomach   Metformin And Related Diarrhea    ROS Review of Systems  Constitutional:  Negative for activity change, appetite change, chills, fatigue and fever.  HENT:  Negative for congestion, postnasal drip, rhinorrhea, sinus pressure, sinus pain, sneezing and sore throat.   Eyes: Negative.   Respiratory:  Negative for cough, chest tightness, shortness of breath and wheezing.   Cardiovascular:  Positive for palpitations. Negative for chest pain.       Intermittent episodes of tachycardia wth palpitations .  Gastrointestinal:  Negative for abdominal pain, constipation, diarrhea, nausea and vomiting.  Endocrine: Negative for cold intolerance, heat intolerance, polydipsia and polyuria.       Blood sugars elevated. TSH also high. Sees endocrinology for maintenance of hypothyroid and diabetes   Genitourinary:  Negative for dyspareunia, dysuria, flank pain, frequency and urgency.  Musculoskeletal:  Negative for arthralgias, back pain and myalgias.  Skin:  Negative for rash.  Allergic/Immunologic: Positive for environmental allergies.  Neurological:  Negative for dizziness, weakness and headaches.  Hematological:  Negative for adenopathy.  Psychiatric/Behavioral:  The patient is not nervous/anxious.      Objective:    Physical Exam Vitals and nursing note reviewed.  Constitutional:      Appearance: Normal appearance. She is well-developed. She is obese.  HENT:     Head: Normocephalic and  atraumatic.     Right Ear: Tympanic membrane, ear canal and external ear normal.     Left Ear: Tympanic membrane, ear canal and external ear normal.     Nose: Nose normal.     Mouth/Throat:     Mouth: Mucous membranes are moist.     Pharynx: Oropharynx is clear.  Eyes:     Extraocular Movements: Extraocular movements intact.     Conjunctiva/sclera: Conjunctivae normal.     Pupils: Pupils are equal, round, and reactive to light.  Neck:     Vascular: No carotid bruit.  Cardiovascular:     Rate and Rhythm: Normal rate and regular rhythm.     Pulses: Normal pulses.     Heart sounds: Normal heart sounds.  Pulmonary:     Effort:  Pulmonary effort is normal.     Breath sounds: Normal breath sounds.  Abdominal:     General: Bowel sounds are normal. There is no distension.     Palpations: Abdomen is soft. There is no mass.     Tenderness: There is no abdominal tenderness. There is no guarding or rebound.     Hernia: No hernia is present.  Musculoskeletal:        General: Normal range of motion.     Cervical back: Normal range of motion and neck supple.  Lymphadenopathy:     Cervical: No cervical adenopathy.  Skin:    General: Skin is warm and dry.     Capillary Refill: Capillary refill takes less than 2 seconds.  Neurological:     General: No focal deficit present.     Mental Status: She is alert and oriented to person, place, and time.  Psychiatric:        Mood and Affect: Mood normal.        Behavior: Behavior normal.        Thought Content: Thought content normal.        Judgment: Judgment normal.   Today's Vitals   10/28/21 1334  BP: 116/69  Pulse: 95  Temp: 98.2 F (36.8 C)  SpO2: 95%  Weight: 223 lb 3.2 oz (101.2 kg)  Height: 5\' 5"  (1.651 m)   Body mass index is 37.14 kg/m.   Wt Readings from Last 3 Encounters:  10/28/21 223 lb 3.2 oz (101.2 kg)  10/02/21 219 lb 14.4 oz (99.7 kg)  10/01/21 221 lb (100.2 kg)     Health Maintenance Due  Topic Date Due    COLONOSCOPY (Pts 45-69yrs Insurance coverage will need to be confirmed)  Never done   Zoster Vaccines- Shingrix (1 of 2) Never done   Pneumococcal Vaccine 70-73 Years old (2 - PCV) 06/12/2012   COVID-19 Vaccine (4 - Booster for Pfizer series) 12/09/2020   FOOT EXAM  08/21/2021   OPHTHALMOLOGY EXAM  09/14/2021    There are no preventive care reminders to display for this patient.  Lab Results  Component Value Date   TSH 7.34 (H) 10/01/2021   Lab Results  Component Value Date   WBC 8.8 04/16/2020   HGB 15.1 04/16/2020   HCT 47.6 (H) 04/16/2020   MCV 98 (H) 04/16/2020   PLT 281 04/16/2020   Lab Results  Component Value Date   NA 141 10/01/2021   K 4.4 10/01/2021   CO2 26 10/01/2021   GLUCOSE 147 (H) 10/01/2021   BUN 13 10/01/2021   CREATININE 0.51 10/01/2021   BILITOT 0.5 04/16/2020   ALKPHOS 86 04/16/2020   AST 14 04/16/2020   ALT 20 02/28/2019   PROT 7.9 04/16/2020   ALBUMIN 4.7 04/16/2020   CALCIUM 9.6 10/01/2021   GFR 101.95 10/01/2021   Lab Results  Component Value Date   CHOL 193 10/01/2021   Lab Results  Component Value Date   HDL 56.80 10/01/2021   Lab Results  Component Value Date   LDLCALC 108 (H) 10/01/2021   Lab Results  Component Value Date   TRIG 140.0 10/01/2021   Lab Results  Component Value Date   CHOLHDL 3 10/01/2021   Lab Results  Component Value Date   HGBA1C 9.3 (A) 10/01/2021      Assessment & Plan:  1. Encounter for general adult medical examination with abnormal findings Annual wellness visit today.  2. Chest tightness Reviewed results of EKG and echocardiogram with  patient.  There is evidence for left ventricular hypertrophy.  Refer to cardiology for further evaluation. - Ambulatory referral to Cardiology  3. Tachycardia Patient with intermittent episodes of tachycardia and palpitations.  EKG showing evidence of left ventricular hypertrophy.  Echo essentially normal.  Will refer to cardiology for further evaluation and  treatment. - Ambulatory referral to Cardiology  4. Type 2 diabetes mellitus with other neurologic complication, without long-term current use of insulin [facility]) Patient continues to see endocrinologist for diabetes.  Refer for diabetic eye exam. - Ambulatory referral to Ophthalmology  5. Body mass index (BMI) of 37.0-37.9 in adult Discussed lowering calorie intake to 1500 calories per day and incorporating exercise into daily routine to help lose weight. Will monitor.   6. Need for influenza vaccination Flu vaccine administered during today's visit. - Flu Vaccine QUAD 6+ mos PF IM (Fluarix Quad PF)  7. Encounter for screening mammogram for malignant neoplasm of breast Order for screening mammogram placed today. - MM DIGITAL SCREENING BILATERAL; Future  8. Allergy to tree nuts New prescription for EpiPen sent to patient's pharmacy.  Use as needed and as prescribed. - EPINEPHrine 0.3 mg/0.3 mL IJ SOAJ injection; Inject 0.3 mg IM as needed for allergic reaction  Dispense: 1 each; Refill: 1   Problem List Items Addressed This Visit       Endocrine   Diabetes mellitus ([place])   Relevant Orders   Ambulatory referral to Ophthalmology     Other   Tachycardia   Relevant Orders   Ambulatory referral to Cardiology   Chest tightness   Relevant Orders   Ambulatory referral to Cardiology   Body mass index (BMI) of 37.0-37.9 in adult   Allergy to tree nuts   Relevant Medications   EPINEPHrine 0.3 mg/0.3 mL IJ SOAJ injection   Other Visit Diagnoses     Encounter for general adult medical examination with abnormal findings    -  Primary   Need for influenza vaccination       Relevant Orders   Flu Vaccine QUAD 6+ mos PF IM (Fluarix Quad PF) (Completed)   Encounter for screening mammogram for malignant neoplasm of breast       Relevant Orders   MM DIGITAL SCREENING BILATERAL       Meds ordered this encounter  Medications   EPINEPHrine 0.3 mg/0.3 mL IJ SOAJ injection    Sig: Inject  0.3 mg IM as needed for allergic reaction    Dispense:  1 each    Refill:  1    Order Specific Question:   Supervising Provider    Answer:   Silverio Decamp [[id number]]     Follow-up: Return in about 4 months (around 02/25/2022) for htn - would like to have shingles shot here in about 2 weeks. this is 1st dose.    Ronnell Freshwater, NP  This note was dictated using Systems analyst. Rapid proofreading was performed to expedite the delivery of the information. Despite proofreading, phonetic errors will occur which are common with this voice recognition software. Please take this into consideration. If there are any concerns, please contact our office.

## 2021-10-31 DIAGNOSIS — M9901 Segmental and somatic dysfunction of cervical region: Secondary | ICD-10-CM | POA: Diagnosis not present

## 2021-10-31 DIAGNOSIS — M25551 Pain in right hip: Secondary | ICD-10-CM | POA: Diagnosis not present

## 2021-10-31 DIAGNOSIS — M50322 Other cervical disc degeneration at C5-C6 level: Secondary | ICD-10-CM | POA: Diagnosis not present

## 2021-10-31 DIAGNOSIS — M9905 Segmental and somatic dysfunction of pelvic region: Secondary | ICD-10-CM | POA: Diagnosis not present

## 2021-11-03 DIAGNOSIS — Z91018 Allergy to other foods: Secondary | ICD-10-CM | POA: Insufficient documentation

## 2021-11-03 NOTE — Patient Instructions (Signed)
Fat and Cholesterol Restricted Eating Plan Getting too much fat and cholesterol in your diet may cause health problems. Choosing the right foods helps keep your fat and cholesterol at normal levels. This can keep you from getting certain diseases. Your doctor may recommend an eating plan that includes: Total fat: ______% or less of total calories a day. This is ______g of fat a day. Saturated fat: ______% or less of total calories a day. This is ______g of saturated fat a day. Cholesterol: less than _________mg a day. Fiber: ______g a day. What are tips for following this plan? General tips Work with your doctor to lose weight if you need to. Avoid: Foods with added sugar. Fried foods. Foods with trans fat or partially hydrogenated oils. This includes some margarines and baked goods. If you drink alcohol: Limit how much you have to: 0-1 drink a day for women who are not pregnant. 0-2 drinks a day for men. Know how much alcohol is in a drink. In the [place] one drink equals one 12 oz bottle of beer (355 mL), one 5 oz glass of wine (148 mL), or one 1 oz glass of hard liquor (44 mL). Reading food labels Check food labels for: Trans fats. Partially hydrogenated oils. Saturated fat (g) in each serving. Cholesterol (mg) in each serving. Fiber (g) in each serving. Choose foods with healthy fats, such as: Monounsaturated fats and polyunsaturated fats. These include olive and canola oil, flaxseeds, walnuts, almonds, and seeds. Omega-3 fats. These are found in certain fish, flaxseed oil, and ground flaxseeds. Choose grain products that have whole grains. Look for the word "whole" as the first word in the ingredient list. Cooking Cook foods using low-fat methods. These include baking, boiling, grilling, and broiling. Eat more home-cooked foods. Eat at restaurants and buffets less often. Eat less fast food. Avoid cooking using saturated fats, such as butter, cream, palm oil, palm kernel oil, and  coconut oil. Meal planning  At meals, divide your plate into four equal parts: Fill one-half of your plate with vegetables, green salads, and fruit. Fill one-fourth of your plate with whole grains. Fill one-fourth of your plate with low-fat (lean) protein foods. Eat fish that is high in omega-3 fats at least two times a week. This includes mackerel, tuna, sardines, and salmon. Eat foods that are high in fiber, such as whole grains, beans, apples, pears, berries, broccoli, carrots, peas, and barley. What foods should I eat? Fruits All fresh, canned (in natural juice), or frozen fruits. Vegetables Fresh or frozen vegetables (raw, steamed, roasted, or grilled). Green salads. Grains Whole grains, such as whole wheat or whole grain breads, crackers, cereals, and pasta. Unsweetened oatmeal, bulgur, barley, quinoa, or brown rice. Corn or whole wheat flour tortillas. Meats and other protein foods Ground beef (85% or leaner), grass-fed beef, or beef trimmed of fat. Skinless chicken or turkey. Ground chicken or turkey. Pork trimmed of fat. All fish and seafood. Egg whites. Dried beans, peas, or lentils. Unsalted nuts or seeds. Unsalted canned beans. Nut butters without added sugar or oil. Dairy Low-fat or nonfat dairy products, such as skim or 1% milk, 2% or reduced-fat cheeses, low-fat and fat-free ricotta or cottage cheese, or plain low-fat and nonfat yogurt. Fats and oils Tub margarine without trans fats. Light or reduced-fat mayonnaise and salad dressings. Avocado. Olive, canola, sesame, or safflower oils. The items listed above may not be a complete list of foods and beverages you can eat. Contact a dietitian for more information. What foods   should I avoid? Fruits Canned fruit in heavy syrup. Fruit in cream or butter sauce. Fried fruit. Vegetables Vegetables cooked in cheese, cream, or butter sauce. Fried vegetables. Grains White bread. White pasta. White rice. Cornbread. Bagels, pastries,  and croissants. Crackers and snack foods that contain trans fat and hydrogenated oils. Meats and other protein foods Fatty cuts of meat. Ribs, chicken wings, bacon, sausage, bologna, salami, chitterlings, fatback, hot dogs, bratwurst, and packaged lunch meats. Liver and organ meats. Whole eggs and egg yolks. Chicken and turkey with skin. Fried meat. Dairy Whole or 2% milk, cream, half-and-half, and cream cheese. Whole milk cheeses. Whole-fat or sweetened yogurt. Full-fat cheeses. Nondairy creamers and whipped toppings. Processed cheese, cheese spreads, and cheese curds. Fats and oils Butter, stick margarine, lard, shortening, ghee, or bacon fat. Coconut, palm kernel, and palm oils. Beverages Alcohol. Sugar-sweetened drinks such as sodas, lemonade, and fruit drinks. Sweets and desserts Corn syrup, sugars, honey, and molasses. Candy. Jam and jelly. Syrup. Sweetened cereals. Cookies, pies, cakes, donuts, muffins, and ice cream. The items listed above may not be a complete list of foods and beverages you should avoid. Contact a dietitian for more information. Summary Choosing the right foods helps keep your fat and cholesterol at normal levels. This can keep you from getting certain diseases. At meals, fill one-half of your plate with vegetables, green salads, and fruits. Eat high fiber foods, like whole grains, beans, apples, pears, berries, carrots, peas, and barley. Limit added sugar, saturated fats, alcohol, and fried foods. This information is not intended to replace advice given to you by your health care provider. Make sure you discuss any questions you have with your health care provider. Document Revised: 04/12/2021 Document Reviewed: 04/12/2021 Elsevier Patient Education  2022 [employer]  

## 2021-11-04 ENCOUNTER — Ambulatory Visit (INDEPENDENT_AMBULATORY_CARE_PROVIDER_SITE_OTHER): Payer: BC Managed Care – PPO

## 2021-11-04 ENCOUNTER — Encounter: Payer: Self-pay | Admitting: Cardiology

## 2021-11-04 ENCOUNTER — Ambulatory Visit (INDEPENDENT_AMBULATORY_CARE_PROVIDER_SITE_OTHER): Payer: BC Managed Care – PPO | Admitting: Cardiology

## 2021-11-04 ENCOUNTER — Other Ambulatory Visit: Payer: Self-pay

## 2021-11-04 VITALS — BP 124/74 | HR 91 | Ht 65.0 in | Wt 221.8 lb

## 2021-11-04 DIAGNOSIS — R002 Palpitations: Secondary | ICD-10-CM | POA: Diagnosis not present

## 2021-11-04 DIAGNOSIS — R079 Chest pain, unspecified: Secondary | ICD-10-CM | POA: Diagnosis not present

## 2021-11-04 DIAGNOSIS — E119 Type 2 diabetes mellitus without complications: Secondary | ICD-10-CM

## 2021-11-04 DIAGNOSIS — R0789 Other chest pain: Secondary | ICD-10-CM

## 2021-11-04 DIAGNOSIS — E669 Obesity, unspecified: Secondary | ICD-10-CM | POA: Insufficient documentation

## 2021-11-04 DIAGNOSIS — R9431 Abnormal electrocardiogram [ECG] [EKG]: Secondary | ICD-10-CM | POA: Diagnosis not present

## 2021-11-04 DIAGNOSIS — E785 Hyperlipidemia, unspecified: Secondary | ICD-10-CM

## 2021-11-04 DIAGNOSIS — I10 Essential (primary) hypertension: Secondary | ICD-10-CM

## 2021-11-04 DIAGNOSIS — Z79899 Other long term (current) drug therapy: Secondary | ICD-10-CM

## 2021-11-04 DIAGNOSIS — G4730 Sleep apnea, unspecified: Secondary | ICD-10-CM

## 2021-11-04 MED ORDER — METOPROLOL TARTRATE 100 MG PO TABS: ORAL_TABLET | ORAL | 0 refills | Status: DC

## 2021-11-04 MED ORDER — METOPROLOL SUCCINATE ER 50 MG PO TB24: ORAL_TABLET | ORAL | 1 refills | Status: DC

## 2021-11-04 MED ORDER — ROSUVASTATIN CALCIUM 10 MG PO TABS: 10.0000 mg | ORAL_TABLET | Freq: Every day | ORAL | 3 refills | Status: DC

## 2021-11-04 NOTE — Patient Instructions (Addendum)
Medication Instructions:  Your physician recommends that you continue on your current medications as directed. Please refer to the Current Medication list given to you today.  *If you need a refill on your cardiac medications before your next appointment, please call your pharmacy*   Lab Work: Your physician recommends that you return for lab work in:  TODAY: BMET, [place] If you have labs (blood work) drawn today and your tests are completely normal, you will receive your results only by: MyChart Message (if you have [place]) OR A paper copy in the mail If you have any lab test that is abnormal or we need to change your treatment, we will call you to review the results.   Testing/Procedures: [place] [place]- Long Term Monitor Instructions  Your physician has requested you wear a ZIO patch monitor for 14 days.  This is a single patch monitor. [employer] supplies one patch monitor per enrollment. Additional stickers are not available. Please do not apply patch if you will be having a Nuclear Stress Test,  Echocardiogram, Cardiac CT, MRI, or Chest Xray during the period you would be wearing the  monitor. The patch cannot be worn during these tests. You cannot remove and re-apply the  ZIO XT patch monitor.  Your ZIO patch monitor will be mailed 3 day USPS to your address on file. It may take 3-5 days  to receive your monitor after you have been enrolled.  Once you have received your monitor, please review the enclosed instructions. Your monitor  has already been registered assigning a specific monitor serial # to you.  Billing and Patient Assistance Program Information  We have supplied [employer] with any of your insurance information on file for billing purposes. [employer] offers a sliding scale Patient Assistance Program for patients that do not have  insurance, or whose insurance does not completely cover the cost of the ZIO monitor.  You must apply for the Patient Assistance Program to qualify for this  discounted rate.  To apply, please call [employer] at [phone], select option 4, select option 2, ask to apply for  Patient Assistance Program. Theodore Demark will ask your household income, and how many people  are in your household. They will quote your out-of-pocket cost based on that information.  [employer] will also be able to set up a 65-month, interest-free payment plan if needed.  Applying the monitor   Shave hair from upper left chest.  Hold abrader disc by orange tab. Rub abrader in 40 strokes over the upper left chest as  indicated in your monitor instructions.  Clean area with 4 enclosed alcohol pads. Let dry.  Apply patch as indicated in monitor instructions. Patch will be placed under collarbone on left  side of chest with arrow pointing upward.  Rub patch adhesive wings for 2 minutes. Remove white label marked "1". Remove the white  label marked "2". Rub patch adhesive wings for 2 additional minutes.  While looking in a mirror, press and release button in center of patch. A small green light will  flash 3-4 times. This will be your only indicator that the monitor has been turned on.  Do not shower for the first 24 hours. You may shower after the first 24 hours.  Press the button if you feel a symptom. You will hear a small click. Record Date, Time and  Symptom in the Patient Logbook.  When you are ready to remove the patch, follow instructions on the last 2 pages of Patient  Logbook. Stick patch  monitor onto the last page of Patient Logbook.  Place Patient Logbook in the blue and white box. Use locking tab on box and tape box closed  securely. The blue and white box has prepaid postage on it. Please place it in the mailbox as  soon as possible. Your physician should have your test results approximately 7 days after the  monitor has been mailed back to [facility].  Call [place] at [phone] if you have questions regarding  your ZIO XT patch monitor. Call  them immediately if you see an orange light blinking on your  monitor.  If your monitor falls off in less than 4 days, contact our Monitor department at [phone].  If your monitor becomes loose or falls off after 4 days call [employer] at [phone] for  suggestions on securing your monitor.    Your cardiac CT will be scheduled at one of the below locations:   [facility] [address] [place], [place] [address] [phone]   If scheduled at [facility], please arrive at the [facility] main entrance (entrance A) of [facility] 30 minutes prior to test start time. You can use the FREE valet parking offered at the main entrance (encouraged to control the heart rate for the test) Proceed to the [facility] Radiology Department (first floor) to check-in and test prep.   Please follow these instructions carefully (unless otherwise directed):   On the Night Before the Test: Be sure to Drink plenty of water. Do not consume any caffeinated/decaffeinated beverages or chocolate 12 hours prior to your test. Do not take any antihistamines 12 hours prior to your test.   On the Day of the Test: Drink plenty of water until 1 hour prior to the test. Do not eat any food 4 hours prior to the test. You may take your regular medications prior to the test.  HOLD metoprolol succinate (Toprol-XL) day of test. Take metoprolol (Lopressor) two hours prior to test. HOLD Hydrochlorothiazide morning of the test. FEMALES- please wear underwire-free bra if available, avoid dresses & tight clothing       After the Test: Drink plenty of water. After receiving IV contrast, you may experience a mild flushed feeling. This is normal. On occasion, you may experience a mild rash up to 24 hours after the test. This is not dangerous. If this occurs, you can take Benadryl 25 mg and increase your fluid intake. If you experience trouble breathing, this can be serious. If it is  severe call 911 IMMEDIATELY. If it is mild, please call our office. If you take any of these medications: Glipizide/Metformin, Avandament, Glucavance, please do not take 48 hours after completing test unless otherwise instructed.  Please allow 2-4 weeks for scheduling of routine cardiac CTs. Some insurance companies require a pre-authorization which may delay scheduling of this test.   For non-scheduling related questions, please contact the cardiac imaging nurse navigator should you have any questions/concerns: Marchia Bond, Cardiac Imaging Nurse Navigator Gordy Clement, Cardiac Imaging Nurse Navigator [facility] Direct Office Dial: [phone]   For scheduling needs, including cancellations and rescheduling, please call [place], [phone].   Follow-Up: At [facility], you and your health needs are our priority.  As part of our continuing mission to provide you with exceptional heart care, we have created designated Provider Care Teams.  These Care Teams include your primary Cardiologist (physician) and Advanced Practice Providers (APPs -  Physician Assistants and Nurse  Practitioners) who all work together to provide you with the care you need, when you need it.  We recommend signing up for the patient portal called "MyChart".  Sign up information is provided on this After Visit Summary.  MyChart is used to connect with patients for Virtual Visits (Telemedicine).  Patients are able to view lab/test results, encounter notes, upcoming appointments, etc.  Non-urgent messages can be sent to your provider as well.   To learn more about what you can do with MyChart, go to [email or web address].    Your next appointment:   6 month(s)  The format for your next appointment:   In Person  Provider:   Berniece Salines, DO   Other Instructions

## 2021-11-04 NOTE — Progress Notes (Signed)
Cardiology Office Note:    Date:  11/04/2021   ID:  Melanie Steele, DOB [birth year], MRN [id number]  PCP:  Melanie Freshwater, NP  Cardiologist:  Melanie Salines, DO  Electrophysiologist:  None   Referring MD: Melanie Freshwater, NP   " I am experiencing chest pain"  History of Present Illness:    Melanie Steele is a 60 y.o. female with a hx of type 2 diabetes which has been well controlled most recent hemoglobin A1c is 9.3 on October 01, 2021, sleep apnea on CPAP, hypertension, obesity, hypothyroidism and family history of premature coronary artery disease.   The patient presents to be evaluated for palpitations.  She describes this as intermittent.  Abrupt onset of fast heartbeat which comes and goes.  She noticed that recently she was at her PCP office and her heart rate was in the 180s beats per minute.  She was asked to relax and it took a few minutes for her heart rate to resolve.  But was noticeable at home and her heart rates going up into the 150s at times lasting from 5 to 8 minutes.  She is concerned about this.  This is new and has not happened to her in the past.  In addition to this the patient notes that she is experiencing left-sided chest tightness sometimes pressure-like sensation.  It comes and goes makes it better or worse.  She does at times have some associated shortness of breath.  Past Medical History:  Diagnosis Date   CPAP (continuous positive airway pressure) dependence    Hyperlipidemia    Hypertension    Sleep apnea    Type 2 diabetes mellitus (HCC)    Vitamin D deficiency     Past Surgical History:  Procedure Laterality Date   ABDOMINAL HYSTERECTOMY     TONSILLECTOMY     TOOTH EXTRACTION     TRIGGER FINGER RELEASE      Current Medications: Current Meds  Medication Sig   amLODipine (NORVASC) 10 MG tablet Take 1 tablet (10 mg total) by mouth daily.   Continuous Blood Gluc Sensor ([facility]) MISC Apply one sensor every 14  days. Dx: E11.49   Dulaglutide (TRULICITY) 3 EK/8.0KL SOPN Inject 3 mg as directed once a week.   empagliflozin (JARDIANCE) 25 MG TABS tablet Take 1 tablet (25 mg total) by mouth daily before breakfast.   EPINEPHrine 0.3 mg/0.3 mL IJ SOAJ injection Inject 0.3 mg IM as needed for allergic reaction   fexofenadine (ALLEGRA) 180 MG tablet Take 1 tablet by mouth daily.   fluticasone (FLONASE) 50 MCG/ACT nasal spray Place into both nostrils 2 (two) times daily.   gabapentin (NEURONTIN) 100 MG capsule Take 1 capsule (100 mg total) by mouth 3 (three) times daily as needed.   glipiZIDE (GLUCOTROL) 5 MG tablet Take 0.5 tablets (2.5 mg total) by mouth daily before breakfast AND 1 tablet (5 mg total) daily before supper.   glucose blood (ONETOUCH VERIO) test strip Daily   hydrochlorothiazide (HYDRODIURIL) 25 MG tablet Take 1 tablet (25 mg total) by mouth daily.   levothyroxine (SYNTHROID) 150 MCG tablet Take 1 tablet (150 mcg total) by mouth as directed. Take 2 tablets on Sundays, and 1 tablet the rest of the week   metoprolol tartrate (LOPRESSOR) 100 MG tablet Take 2 hours prior to CT   Multiple Vitamin (MULTIVITAMIN) tablet Take 1 tablet by mouth daily.   potassium chloride SA (KLOR-CON) 20 MEQ tablet Take 1 tablet (20 mEq  total) by mouth daily.   [DISCONTINUED] metoprolol succinate (TOPROL-XL) 50 MG 24 hr tablet TAKE 1 TABLET(50 MG) BY MOUTH DAILY   [DISCONTINUED] rosuvastatin (CRESTOR) 10 MG tablet Take 1 tablet (10 mg total) by mouth daily.     Allergies:   Lisinopril, Other, Linagliptin, and Metformin and related   Social History   Socioeconomic History   Marital status: Widowed    Spouse name: Not on file   Number of children: Not on file   Years of education: Not on file   Highest education level: Not on file  Occupational History   Not on file  Tobacco Use   Smoking status: Never   Smokeless tobacco: Never  Vaping Use   Vaping Use: Never used  Substance and Sexual Activity   Alcohol  use: Yes    Comment: occ   Drug use: Never   Sexual activity: Not Currently  Other Topics Concern   Not on file  Social History Narrative   Not on file   Social Determinants of Health   Financial Resource Strain: Not on file  Food Insecurity: Not on file  Transportation Needs: Not on file  Physical Activity: Not on file  Stress: Not on file  Social Connections: Not on file     Family History: The patient's family history includes Diabetes in her mother; Hypertension in her mother and sister; Stroke in her father.  ROS:   Review of Systems  Constitution: Negative for decreased appetite, fever and weight gain.  HENT: Negative for congestion, ear discharge, hoarse voice and sore throat.   Eyes: Negative for discharge, redness, vision loss in right eye and visual halos.  Cardiovascular: Reports  chest pain and palpitations.  Negative for dyspnea on exertion, leg swelling, orthopnea.Respiratory: Negative for cough, hemoptysis, shortness of breath and snoring.   Endocrine: Negative for heat intolerance and polyphagia.  Hematologic/Lymphatic: Negative for bleeding problem. Does not bruise/bleed easily.  Skin: Negative for flushing, nail changes, rash and suspicious lesions.  Musculoskeletal: Negative for arthritis, joint pain, muscle cramps, myalgias, neck pain and stiffness.  Gastrointestinal: Negative for abdominal pain, bowel incontinence, diarrhea and excessive appetite.  Genitourinary: Negative for decreased libido, genital sores and incomplete emptying.  Neurological: Negative for brief paralysis, focal weakness, headaches and loss of balance.  Psychiatric/Behavioral: Negative for altered mental status, depression and suicidal ideas.  Allergic/Immunologic: Negative for HIV exposure and persistent infections.    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   TTE 10/18/2021 IMPRESSIONS   1. Left ventricular ejection fraction, by estimation, is 60 to 65%. Left ventricular ejection fraction by 3D volume is 59 %. The left ventricle has  normal function. The left ventricle has no regional wall motion abnormalities. There is mild concentric left ventricular hypertrophy. Left ventricular diastolic parameters are consistent with Grade I diastolic dysfunction (impaired relaxation). The average left ventricular global longitudinal strain is -20.4 %. The global  longitudinal strain is normal.   2. Right ventricular systolic function is normal. The right ventricular  size is normal. Tricuspid regurgitation signal is inadequate for assessing  PA pressure.   3. The mitral valve is grossly normal. No evidence of mitral valve  regurgitation. No evidence of mitral stenosis.   4. The aortic valve is tricuspid. There is mild calcification  of the  aortic valve. Aortic valve regurgitation is mild. No aortic stenosis is  present.   Comparison(s): Prior images unable to be directly viewed, comparison made  by report only. 07/11/21 EF 55-60%.   FINDINGS   Left Ventricle: Left ventricular ejection fraction, by estimation, is 60  to 65%. Left ventricular ejection fraction by 3D volume is 59 %. The left  ventricle has normal function. The left ventricle has no regional wall  motion abnormalities. The average  left ventricular global longitudinal strain is -20.4 %. The global  longitudinal strain is normal. The left ventricular internal cavity size  was normal in size. There is mild concentric left ventricular hypertrophy.  Left ventricular diastolic parameters  are consistent with Grade I diastolic dysfunction (impaired relaxation).   Right Ventricle: The right ventricular size is normal. No increase in  right ventricular wall thickness.  Right ventricular systolic function is  normal. Tricuspid regurgitation signal is inadequate for assessing PA  pressure.   Left Atrium: Left atrial size was normal in size.   Right Atrium: Right atrial size was normal in size.   Pericardium: Trivial pericardial effusion is present.   Mitral Valve: The mitral valve is grossly normal. No evidence of mitral  valve regurgitation. No evidence of mitral valve stenosis.   Tricuspid Valve: The tricuspid valve is normal in structure. Tricuspid  valve regurgitation is trivial. No evidence of tricuspid stenosis.   Aortic Valve: The aortic valve is tricuspid. There is mild calcification  of the aortic valve. Aortic valve regurgitation is mild. Aortic  regurgitation PHT measures 335 msec. No aortic stenosis is present.   Pulmonic Valve: The pulmonic valve was not well visualized. Pulmonic valve  regurgitation is trivial. No evidence of pulmonic stenosis.   Aorta: The aortic root and ascending aorta are structurally normal, with  no evidence of dilitation.   IAS/Shunts: The atrial septum is grossly normal.    Recent Labs: 10/01/2021: BUN 13; Creatinine, Ser 0.51; Potassium 4.4; Sodium 141; TSH 7.34  Recent Lipid Panel    Component Value Date/Time   CHOL 193 10/01/2021 1048   CHOL 188 07/17/2020 0913   TRIG 140.0 10/01/2021 1048   HDL 56.80 10/01/2021 1048   HDL 57 07/17/2020 0913   CHOLHDL 3 10/01/2021 1048   VLDL 28.0 10/01/2021 1048   LDLCALC 108 (H) 10/01/2021 1048   LDLCALC 111 (H) 07/17/2020 0913    Physical Exam:    VS:  BP 124/74 (BP Location: Right Arm)   Pulse 91   Ht 5\' 5"  (1.651 m)   Wt 221 lb 12.8 oz (100.6 kg)   SpO2 95% Comment: Patient has acrylic nails on.  BMI 36.91 kg/m     Wt Readings from Last 3 Encounters:  11/04/21 221 lb 12.8 oz (100.6 kg)  10/28/21 223 lb 3.2 oz (101.2 kg)  10/02/21 219 lb 14.4 oz (99.7 kg)     GEN: Well nourished, well developed in no acute distress HEENT: Normal NECK: No JVD;  No carotid bruits LYMPHATICS: No lymphadenopathy CARDIAC: S1S2 noted,RRR, no murmurs, rubs, gallops RESPIRATORY:  Clear to auscultation without rales, wheezing or rhonchi  ABDOMEN: Soft, non-tender, non-distended, +bowel sounds, no guarding. EXTREMITIES: No edema, No cyanosis, no clubbing MUSCULOSKELETAL:  No deformity  SKIN: Warm and dry NEUROLOGIC:  Alert and oriented x 3, non-focal PSYCHIATRIC:  Normal affect, good insight  ASSESSMENT:    1. Chest tightness   2. Palpitations   3. Obesity (BMI 30-39.9)   4. Chest pain, unspecified type  5. Hyperlipidemia, unspecified hyperlipidemia type   6. Abnormal EKG   7. Type 2 diabetes mellitus without complication, without long-term current use of insulin ([place])   8. Sleep apnea, unspecified type   9. Essential hypertension   10. Medication management    PLAN:    The symptoms chest pain is concerning, this patient does have intermediate risk for coronary artery disease and at this time I would like to pursue an ischemic evaluation in this patient.  Shared decision a coronary CTA at this time is appropriate.  I have discussed with the patient about the testing.  The patient has no IV contrast allergy and is agreeable to proceed with this test.  I would like to rule out a cardiovascular etiology of this palpitation, therefore at this time I would like to placed a zio patch for 14  days.  Hyperlipidemia - continue with current statin medication.  Blood pressure is acceptable, continue with current antihypertensive regimen.  Continue use of your CPAP.  The patient understands the need to lose weight with diet and exercise. We have discussed specific strategies for this.   Once these testing have been performed amd reviewed further reccomendations will be made. For now, I do reccomend that the patient goes to the nearest ED if  symptoms recur.   The patient is in agreement with the above plan. The patient left the office in stable  condition.  The patient will follow up in 6 months or sooner if needed.   Medication Adjustments/Labs and Tests Ordered: Current medicines are reviewed at length with the patient today.  Concerns regarding medicines are outlined above.  Orders Placed This Encounter  Procedures   CT CORONARY MORPH W/CTA COR W/SCORE W/CA W/CM &/OR WO/CM   Basic Metabolic Panel (BMET)   Magnesium   LONG TERM MONITOR (3-14 DAYS)   EKG 12-Lead    Meds ordered this encounter  Medications   rosuvastatin (CRESTOR) 10 MG tablet    Sig: Take 1 tablet (10 mg total) by mouth daily.    Dispense:  90 tablet    Refill:  3   metoprolol succinate (TOPROL-XL) 50 MG 24 hr tablet    Sig: TAKE 1 TABLET(50 MG) BY MOUTH DAILY    Dispense:  90 tablet    Refill:  1    ZERO refills remain on this prescription. Your patient is requesting advance approval of refills for this medication to PREVENT ANY MISSED DOSES   metoprolol tartrate (LOPRESSOR) 100 MG tablet    Sig: Take 2 hours prior to CT    Dispense:  1 tablet    Refill:  0     Patient Instructions  Medication Instructions:  Your physician recommends that you continue on your current medications as directed. Please refer to the Current Medication list given to you today.  *If you need a refill on your cardiac medications before your next appointment, please call your pharmacy*   Lab Work: Your physician recommends that you return for lab work in:  TODAY: BMET, [place] If you have labs (blood work) drawn today and your tests are completely normal, you will receive your results only by: MyChart Message (if you have [place]) OR A paper copy in the mail If you have any lab test that is abnormal or we need to change your treatment, we will call you to review the results.   Testing/Procedures: [place]- Long Term Monitor Instructions  Your physician has requested you wear a ZIO patch monitor for 14  days.  This is a single patch monitor. [employer] supplies one patch  monitor per enrollment. Additional stickers are not available. Please do not apply patch if you will be having a Nuclear Stress Test,  Echocardiogram, Cardiac CT, MRI, or Chest Xray during the period you would be wearing the  monitor. The patch cannot be worn during these tests. You cannot remove and re-apply the  ZIO XT patch monitor.  Your ZIO patch monitor will be mailed 3 day USPS to your address on file. It may take 3-5 days  to receive your monitor after you have been enrolled.  Once you have received your monitor, please review the enclosed instructions. Your monitor  has already been registered assigning a specific monitor serial # to you.  Billing and Patient Assistance Program Information  We have supplied [employer] with any of your insurance information on file for billing purposes. [employer] offers a sliding scale Patient Assistance Program for patients that do not have  insurance, or whose insurance does not completely cover the cost of the ZIO monitor.  You must apply for the Patient Assistance Program to qualify for this discounted rate.  To apply, please call [employer] at [phone], select option 4, select option 2, ask to apply for  Patient Assistance Program. Theodore Demark will ask your household income, and how many people  are in your household. They will quote your out-of-pocket cost based on that information.  [employer] will also be able to set up a 82-month, interest-free payment plan if needed.  Applying the monitor   Shave hair from upper left chest.  Hold abrader disc by orange tab. Rub abrader in 40 strokes over the upper left chest as  indicated in your monitor instructions.  Clean area with 4 enclosed alcohol pads. Let dry.  Apply patch as indicated in monitor instructions. Patch will be placed under collarbone on left  side of chest with arrow pointing upward.  Rub patch adhesive wings for 2 minutes. Remove white label marked "1". Remove the white  label marked "2".  Rub patch adhesive wings for 2 additional minutes.  While looking in a mirror, press and release button in center of patch. A small green light will  flash 3-4 times. This will be your only indicator that the monitor has been turned on.  Do not shower for the first 24 hours. You may shower after the first 24 hours.  Press the button if you feel a symptom. You will hear a small click. Record Date, Time and  Symptom in the Patient Logbook.  When you are ready to remove the patch, follow instructions on the last 2 pages of Patient  Logbook. Stick patch monitor onto the last page of Patient Logbook.  Place Patient Logbook in the blue and white box. Use locking tab on box and tape box closed  securely. The blue and white box has prepaid postage on it. Please place it in the mailbox as  soon as possible. Your physician should have your test results approximately 7 days after the  monitor has been mailed back to [facility].  Call [place] at [phone] if you have questions regarding  your ZIO XT patch monitor. Call them immediately if you see an orange light blinking on your  monitor.  If your monitor falls off in less than 4 days, contact our Monitor department at [phone].  If your monitor becomes loose or falls off after 4 days call [employer] at [phone] for  suggestions on securing  your monitor.    Your cardiac CT will be scheduled at one of the below locations:   [facility] [address] [place], [place] [address] [phone]   If scheduled at [facility], please arrive at the [facility] main entrance (entrance A) of [facility] 30 minutes prior to test start time. You can use the FREE valet parking offered at the main entrance (encouraged to control the heart rate for the test) Proceed to the [facility] Radiology Department (first floor) to check-in and test prep.   Please follow these instructions carefully  (unless otherwise directed):   On the Night Before the Test: Be sure to Drink plenty of water. Do not consume any caffeinated/decaffeinated beverages or chocolate 12 hours prior to your test. Do not take any antihistamines 12 hours prior to your test.   On the Day of the Test: Drink plenty of water until 1 hour prior to the test. Do not eat any food 4 hours prior to the test. You may take your regular medications prior to the test.  HOLD metoprolol succinate (Toprol-XL) day of test. Take metoprolol (Lopressor) two hours prior to test. HOLD Hydrochlorothiazide morning of the test. FEMALES- please wear underwire-free bra if available, avoid dresses & tight clothing       After the Test: Drink plenty of water. After receiving IV contrast, you may experience a mild flushed feeling. This is normal. On occasion, you may experience a mild rash up to 24 hours after the test. This is not dangerous. If this occurs, you can take Benadryl 25 mg and increase your fluid intake. If you experience trouble breathing, this can be serious. If it is severe call 911 IMMEDIATELY. If it is mild, please call our office. If you take any of these medications: Glipizide/Metformin, Avandament, Glucavance, please do not take 48 hours after completing test unless otherwise instructed.  Please allow 2-4 weeks for scheduling of routine cardiac CTs. Some insurance companies require a pre-authorization which may delay scheduling of this test.   For non-scheduling related questions, please contact the cardiac imaging nurse navigator should you have any questions/concerns: Marchia Bond, Cardiac Imaging Nurse Navigator Gordy Clement, Cardiac Imaging Nurse Navigator [facility] Direct Office Dial: [phone]   For scheduling needs, including cancellations and rescheduling, please call [place], [phone].   Follow-Up: At [facility], you and your health needs are our priority.  As  part of our continuing mission to provide you with exceptional heart care, we have created designated Provider Care Teams.  These Care Teams include your primary Cardiologist (physician) and Advanced Practice Providers (APPs -  Physician Assistants and Nurse Practitioners) who all work together to provide you with the care you need, when you need it.  We recommend signing up for the patient portal called "MyChart".  Sign up information is provided on this After Visit Summary.  MyChart is used to connect with patients for Virtual Visits (Telemedicine).  Patients are able to view lab/test results, encounter notes, upcoming appointments, etc.  Non-urgent messages can be sent to your provider as well.   To learn more about what you can do with MyChart, go to [email or web address].    Your next appointment:   6 month(s)  The format for your next appointment:   In Person  Provider:   Berniece Salines, DO   Other Instructions     Adopting a Healthy Lifestyle.  Know what a healthy weight is for you (roughly BMI <  25) and aim to maintain this   Aim for 7+ servings of fruits and vegetables daily   65-80+ fluid ounces of water or unsweet tea for healthy kidneys   Limit to max 1 drink of alcohol per day; avoid smoking/tobacco   Limit animal fats in diet for cholesterol and heart health - choose grass fed whenever available   Avoid highly processed foods, and foods high in saturated/trans fats   Aim for low stress - take time to unwind and care for your mental health   Aim for 150 min of moderate intensity exercise weekly for heart health, and weights twice weekly for bone health   Aim for 7-9 hours of sleep daily   When it comes to diets, agreement about the perfect plan isnt easy to find, even among the experts. Experts at the [employer] developed an idea known as the Healthy Eating Plate. Just imagine a plate divided into logical, healthy portions.   The emphasis is  on diet quality:   Load up on vegetables and fruits - one-half of your plate: Aim for color and variety, and remember that potatoes dont count.   Go for whole grains - one-quarter of your plate: Whole wheat, barley, wheat berries, quinoa, oats, brown rice, and foods made with them. If you want pasta, go with whole wheat pasta.   Protein power - one-quarter of your plate: Fish, chicken, beans, and nuts are all healthy, versatile protein sources. Limit red meat.   The diet, however, does go beyond the plate, offering a few other suggestions.   Use healthy plant oils, such as olive, canola, soy, corn, sunflower and peanut. Check the labels, and avoid partially hydrogenated oil, which have unhealthy trans fats.   If youre thirsty, drink water. Coffee and tea are good in moderation, but skip sugary drinks and limit milk and dairy products to one or two daily servings.   The type of carbohydrate in the diet is more important than the amount. Some sources of carbohydrates, such as vegetables, fruits, whole grains, and beans-are healthier than others.   Finally, stay active  Signed, Melanie Salines, DO  11/04/2021 12:01 PM    [facility]

## 2021-11-04 NOTE — Progress Notes (Unsigned)
Enrolled for [employer] to mail a ZIO XT long term holter monitor to the patients address on file.  

## 2021-11-05 LAB — BASIC METABOLIC PANEL
BUN/Creatinine Ratio: 22 (ref 9–23)
BUN: 13 mg/dL (ref 6–24)
CO2: 24 mmol/L (ref 20–29)
Calcium: 10.6 mg/dL — ABNORMAL HIGH (ref 8.7–10.2)
Chloride: 99 mmol/L (ref 96–106)
Creatinine, Ser: 0.6 mg/dL (ref 0.57–1.00)
Glucose: 149 mg/dL — ABNORMAL HIGH (ref 70–99)
Potassium: 3.9 mmol/L (ref 3.5–5.2)
Sodium: 144 mmol/L (ref 134–144)
eGFR: 103 mL/min/{1.73_m2} (ref >59)

## 2021-11-05 LAB — MAGNESIUM: Magnesium: 2.1 mg/dL (ref 1.6–2.3)

## 2021-11-06 ENCOUNTER — Telehealth: Payer: Self-pay | Admitting: Cardiology

## 2021-11-06 NOTE — Telephone Encounter (Signed)
Spoke with pt, aware of lab results. 

## 2021-11-06 NOTE — Telephone Encounter (Signed)
Patient is returning call to discuss lab results. 

## 2021-11-11 DIAGNOSIS — M25551 Pain in right hip: Secondary | ICD-10-CM | POA: Diagnosis not present

## 2021-11-11 DIAGNOSIS — M9905 Segmental and somatic dysfunction of pelvic region: Secondary | ICD-10-CM | POA: Diagnosis not present

## 2021-11-11 DIAGNOSIS — M9901 Segmental and somatic dysfunction of cervical region: Secondary | ICD-10-CM | POA: Diagnosis not present

## 2021-11-11 DIAGNOSIS — M50322 Other cervical disc degeneration at C5-C6 level: Secondary | ICD-10-CM | POA: Diagnosis not present

## 2021-11-13 ENCOUNTER — Ambulatory Visit (INDEPENDENT_AMBULATORY_CARE_PROVIDER_SITE_OTHER): Payer: BC Managed Care – PPO | Admitting: Nurse Practitioner

## 2021-11-13 ENCOUNTER — Other Ambulatory Visit: Payer: Self-pay

## 2021-11-13 VITALS — BP 146/66 | HR 92 | Temp 97.7°F | Ht 65.0 in | Wt 224.0 lb

## 2021-11-13 DIAGNOSIS — Z23 Encounter for immunization: Secondary | ICD-10-CM | POA: Diagnosis not present

## 2021-11-13 DIAGNOSIS — I10 Essential (primary) hypertension: Secondary | ICD-10-CM

## 2021-11-13 DIAGNOSIS — M50322 Other cervical disc degeneration at C5-C6 level: Secondary | ICD-10-CM | POA: Diagnosis not present

## 2021-11-13 DIAGNOSIS — M25551 Pain in right hip: Secondary | ICD-10-CM | POA: Diagnosis not present

## 2021-11-13 DIAGNOSIS — M9901 Segmental and somatic dysfunction of cervical region: Secondary | ICD-10-CM | POA: Diagnosis not present

## 2021-11-13 DIAGNOSIS — M9905 Segmental and somatic dysfunction of pelvic region: Secondary | ICD-10-CM | POA: Diagnosis not present

## 2021-11-13 MED ORDER — AMLODIPINE BESYLATE 10 MG PO TABS: 10.0000 mg | ORAL_TABLET | Freq: Every day | ORAL | 0 refills | Status: DC

## 2021-11-13 NOTE — Progress Notes (Signed)
Patient tolerated injection well. VIS given. AS, CMA

## 2021-11-14 ENCOUNTER — Telehealth (HOSPITAL_COMMUNITY): Payer: Self-pay | Admitting: Emergency Medicine

## 2021-11-14 NOTE — Telephone Encounter (Signed)
Unable to leave vm Dj Senteno RN Navigator Cardiac Imaging  [facility] [phone] Office  [phone] Cell  

## 2021-11-15 ENCOUNTER — Other Ambulatory Visit: Payer: Self-pay

## 2021-11-15 ENCOUNTER — Ambulatory Visit (HOSPITAL_COMMUNITY)
Admission: RE | Admit: 2021-11-15 | Discharge: 2021-11-15 | Disposition: A | Discharge status: Home / Self Care | Payer: BC Managed Care – PPO | Source: Ambulatory Visit | Attending: Cardiology | Admitting: Cardiology

## 2021-11-15 ENCOUNTER — Encounter (HOSPITAL_COMMUNITY): Payer: Self-pay

## 2021-11-15 DIAGNOSIS — R002 Palpitations: Secondary | ICD-10-CM | POA: Diagnosis not present

## 2021-11-15 DIAGNOSIS — R079 Chest pain, unspecified: Secondary | ICD-10-CM | POA: Diagnosis not present

## 2021-11-15 MED ORDER — METOPROLOL TARTRATE 5 MG/5ML IV SOLN
5.0000 mg | INTRAVENOUS | Status: DC | PRN
Start: 2021-11-15 — End: 2021-11-16

## 2021-11-15 MED ORDER — DILTIAZEM HCL 25 MG/5ML IV SOLN
INTRAVENOUS | Status: AC
  Administered 2021-11-15: 10 mg via INTRAVENOUS
  Filled 2021-11-15: qty 5

## 2021-11-15 MED ORDER — NITROGLYCERIN 0.4 MG SL SUBL: 0.8000 mg | SUBLINGUAL_TABLET | Freq: Once | SUBLINGUAL | Status: DC

## 2021-11-15 MED ORDER — DILTIAZEM HCL 25 MG/5ML IV SOLN
5.0000 mg | INTRAVENOUS | Status: DC | PRN
Start: 2021-11-15 — End: 2021-11-16
  Administered 2021-11-15: 10 mg via INTRAVENOUS

## 2021-11-15 MED ORDER — METOPROLOL TARTRATE 5 MG/5ML IV SOLN
INTRAVENOUS | Status: AC
  Administered 2021-11-15: 5 mg via INTRAVENOUS
  Filled 2021-11-15: qty 10

## 2021-11-15 NOTE — Progress Notes (Signed)
Pt's heart rate continues to remain in the 80-85 range even after IV diltiazem and metoprolol given. Pt reports drowsiness since PO metoprolol taken prior to CT schedule. Dr. Oval Linsey notified. Per MD, abort procedure at this time. CT heart navigator notified.

## 2021-11-18 DIAGNOSIS — M9905 Segmental and somatic dysfunction of pelvic region: Secondary | ICD-10-CM | POA: Diagnosis not present

## 2021-11-18 DIAGNOSIS — M9901 Segmental and somatic dysfunction of cervical region: Secondary | ICD-10-CM | POA: Diagnosis not present

## 2021-11-18 DIAGNOSIS — M25551 Pain in right hip: Secondary | ICD-10-CM | POA: Diagnosis not present

## 2021-11-18 DIAGNOSIS — M50322 Other cervical disc degeneration at C5-C6 level: Secondary | ICD-10-CM | POA: Diagnosis not present

## 2021-11-21 DIAGNOSIS — M9905 Segmental and somatic dysfunction of pelvic region: Secondary | ICD-10-CM | POA: Diagnosis not present

## 2021-11-21 DIAGNOSIS — M50322 Other cervical disc degeneration at C5-C6 level: Secondary | ICD-10-CM | POA: Diagnosis not present

## 2021-11-21 DIAGNOSIS — M9901 Segmental and somatic dysfunction of cervical region: Secondary | ICD-10-CM | POA: Diagnosis not present

## 2021-11-21 DIAGNOSIS — M25551 Pain in right hip: Secondary | ICD-10-CM | POA: Diagnosis not present

## 2021-11-22 ENCOUNTER — Other Ambulatory Visit (HOSPITAL_COMMUNITY): Payer: Self-pay

## 2021-11-22 ENCOUNTER — Telehealth: Payer: Self-pay

## 2021-11-22 ENCOUNTER — Other Ambulatory Visit: Payer: Self-pay

## 2021-11-22 MED ORDER — IVABRADINE HCL 5 MG PO TABS
ORAL_TABLET | ORAL | 0 refills | Status: DC
  Filled 2021-11-22 – 2021-12-05 (×2): qty 2, 1d supply, fill #0

## 2021-11-22 NOTE — Progress Notes (Signed)
Prescription sent to pharmacy.

## 2021-11-22 NOTE — Telephone Encounter (Signed)
Called pt to let her know we called in another medication (Ivabradine 10 mg) for her at [facility] and she is to take this 2 hours before the CT scan just like the Metoprolol. She verbalized understanding.

## 2021-11-25 DIAGNOSIS — M9905 Segmental and somatic dysfunction of pelvic region: Secondary | ICD-10-CM | POA: Diagnosis not present

## 2021-11-25 DIAGNOSIS — M25551 Pain in right hip: Secondary | ICD-10-CM | POA: Diagnosis not present

## 2021-11-25 DIAGNOSIS — M50322 Other cervical disc degeneration at C5-C6 level: Secondary | ICD-10-CM | POA: Diagnosis not present

## 2021-11-25 DIAGNOSIS — M9901 Segmental and somatic dysfunction of cervical region: Secondary | ICD-10-CM | POA: Diagnosis not present

## 2021-11-25 NOTE — Telephone Encounter (Signed)
error 

## 2021-12-02 ENCOUNTER — Other Ambulatory Visit (HOSPITAL_COMMUNITY): Payer: Self-pay

## 2021-12-04 ENCOUNTER — Telehealth (HOSPITAL_COMMUNITY): Payer: Self-pay | Admitting: *Deleted

## 2021-12-04 ENCOUNTER — Other Ambulatory Visit (HOSPITAL_COMMUNITY): Payer: Self-pay | Admitting: *Deleted

## 2021-12-04 DIAGNOSIS — Z01812 Encounter for preprocedural laboratory examination: Secondary | ICD-10-CM

## 2021-12-04 NOTE — Telephone Encounter (Signed)
Reaching out to patient to offer assistance regarding upcoming cardiac imaging study; pt verbalizes understanding of appt date/time, parking situation and where to check in, pre-test NPO status and medications ordered, name and call back number provided for further questions should they arise  Gordy Clement RN Navigator Cardiac Imaging [facility] [phone] office [phone] cell  Patient to take 75mg  metoprolol succinate and 10mg  ivabradine two hours prior to cardiac CT scan. She is aware to arrive at 2:45pm for her 3:15pm scan.

## 2021-12-05 ENCOUNTER — Telehealth (HOSPITAL_COMMUNITY): Payer: Self-pay | Admitting: *Deleted

## 2021-12-05 ENCOUNTER — Other Ambulatory Visit (HOSPITAL_COMMUNITY): Payer: Self-pay

## 2021-12-05 NOTE — Telephone Encounter (Signed)
Reaching out to patient to clarify medications for cardiac CT scan.  Patient requesting confirmation from Dr. Harriet Masson and Dr. Harriet Masson was notified.  After discussion with Dr. Harriet Masson, patient is recommended to take 50-75mg  metoprolol succinate and 10mg  ivabradine two hours prior to cardiac CT scan.    Gordy Clement RN Navigator Cardiac Imaging [facility] [phone] Office [phone] Cell

## 2021-12-05 NOTE — Telephone Encounter (Signed)
Called pt to go over the medications she should take prior to CT scan. She understands she needs to take Metoprolol Succinate 50-75 mg (1-1.5 tablets) as well as Ivabradine 10 mg 2 hours before her CT scan. She verbalized understanding on the instructions and thanked me for calling her.

## 2021-12-06 ENCOUNTER — Other Ambulatory Visit (HOSPITAL_COMMUNITY): Payer: Self-pay

## 2021-12-06 ENCOUNTER — Telehealth: Payer: Self-pay | Admitting: Nurse Practitioner

## 2021-12-06 ENCOUNTER — Telehealth: Payer: Self-pay | Admitting: Internal Medicine

## 2021-12-06 ENCOUNTER — Other Ambulatory Visit: Payer: Self-pay

## 2021-12-06 DIAGNOSIS — I10 Essential (primary) hypertension: Secondary | ICD-10-CM

## 2021-12-06 MED ORDER — POTASSIUM CHLORIDE CRYS ER 20 MEQ PO TBCR: 20.0000 meq | EXTENDED_RELEASE_TABLET | Freq: Every day | ORAL | 0 refills | Status: DC

## 2021-12-06 MED ORDER — LEVOTHYROXINE SODIUM 150 MCG PO TABS
150.0000 ug | ORAL_TABLET | ORAL | 3 refills | Status: DC
Start: 2021-12-06 — End: 2022-02-05

## 2021-12-06 MED ORDER — AMLODIPINE BESYLATE 10 MG PO TABS: 10.0000 mg | ORAL_TABLET | Freq: Every day | ORAL | 0 refills | Status: DC

## 2021-12-06 NOTE — Telephone Encounter (Signed)
Script sent  

## 2021-12-06 NOTE — Telephone Encounter (Signed)
Patient is requesting refills on the below medications.  Patient needs both to be 90-day supply.  Please send to [facility].    amLODipine (NORVASC) 10 MG tablet [[id number]]    Order Details Dose: 10 mg Route: Oral Frequency: Daily  Dispense Quantity: 90 tablet Refills: 0        Sig: Take 1 tablet (10 mg total) by mouth daily.       Start Date: 11/13/21 End Date: --  Written Date: 11/13/21 Expiration Date: 11/13/22     Diagnosis Association: Essential hypertension (I10)  Original Order:  amLODipine (NORVASC) 10 MG tablet [[id number]]   potassium chloride SA (KLOR-CON) 20 MEQ tablet [[id number]]    Order Details Dose: 20 mEq Route: Oral Frequency: Daily  Dispense Quantity: 90 tablet Refills: 0        Sig: Take 1 tablet (20 mEq total) by mouth daily.       Start Date: 08/20/21 End Date: --  Written Date: 08/20/21 Expiration Date: 08/20/22     Diagnosis Association: Essential hypertension (I10)  Original Order:  potassium chloride SA (KLOR-CON) 20 MEQ tablet [[id number]]

## 2021-12-06 NOTE — Telephone Encounter (Signed)
Called pt she is advised of Rx that was sent into pharmacy

## 2021-12-06 NOTE — Telephone Encounter (Signed)
Patient called re: RX for levothyroxine (SYNTHROID) 150 MCG tablet was sent to wrong PHARM. Request to resend the RX listed above with refills to:  [facility] [phone] [address], [address] Phone:  [phone]  Fax:  [phone]     Patient states she does not use [facility].  Also, Patient is having trouble getting Trulicity (PHARM listed above has a wait list). Patient states she will other Pharmacies to find out if they have Trulicity in stock and will call back later to let us know.

## 2021-12-10 ENCOUNTER — Other Ambulatory Visit (HOSPITAL_COMMUNITY): Payer: Self-pay

## 2021-12-10 ENCOUNTER — Ambulatory Visit (HOSPITAL_COMMUNITY)
Admission: RE | Admit: 2021-12-10 | Discharge: 2021-12-10 | Disposition: A | Discharge status: Home / Self Care | Payer: BC Managed Care – PPO | Source: Ambulatory Visit | Attending: Cardiology | Admitting: Cardiology

## 2021-12-10 ENCOUNTER — Other Ambulatory Visit: Payer: Self-pay

## 2021-12-10 ENCOUNTER — Encounter (HOSPITAL_COMMUNITY): Payer: Self-pay

## 2021-12-10 DIAGNOSIS — R079 Chest pain, unspecified: Secondary | ICD-10-CM | POA: Diagnosis not present

## 2021-12-10 LAB — POCT I-STAT CREATININE: Creatinine, Ser: 0.4 mg/dL — ABNORMAL LOW (ref 0.44–1.00)

## 2021-12-10 MED ORDER — DILTIAZEM HCL 25 MG/5ML IV SOLN
INTRAVENOUS | Status: AC
  Administered 2021-12-10: 16:00:00 10 mg
  Filled 2021-12-10: qty 5

## 2021-12-10 MED ORDER — NITROGLYCERIN 0.4 MG SL SUBL
SUBLINGUAL_TABLET | SUBLINGUAL | Status: AC
  Filled 2021-12-10: qty 2

## 2021-12-10 MED ORDER — IOHEXOL 350 MG/ML SOLN
95.0000 mL | Freq: Once | INTRAVENOUS | Status: AC | PRN
  Administered 2021-12-10: 16:00:00 95 mL via INTRAVENOUS

## 2021-12-10 MED ORDER — DILTIAZEM HCL 25 MG/5ML IV SOLN
20.0000 mg | Freq: Once | INTRAVENOUS | Status: AC
  Administered 2021-12-10: 16:00:00 10 mg via INTRAVENOUS

## 2021-12-10 MED ORDER — METOPROLOL TARTRATE 5 MG/5ML IV SOLN
10.0000 mg | INTRAVENOUS | Status: DC | PRN
  Administered 2021-12-10 (×2): 10 mg via INTRAVENOUS

## 2021-12-10 MED ORDER — METOPROLOL TARTRATE 5 MG/5ML IV SOLN
INTRAVENOUS | Status: AC
  Filled 2021-12-10: qty 20

## 2021-12-10 MED ORDER — NITROGLYCERIN 0.4 MG SL SUBL
0.8000 mg | SUBLINGUAL_TABLET | Freq: Once | SUBLINGUAL | Status: AC
  Administered 2021-12-10: 16:00:00 0.8 mg via SUBLINGUAL

## 2021-12-13 ENCOUNTER — Other Ambulatory Visit: Payer: Self-pay

## 2021-12-13 DIAGNOSIS — I272 Pulmonary hypertension, unspecified: Secondary | ICD-10-CM

## 2021-12-13 NOTE — Progress Notes (Signed)
Referral placed.

## 2021-12-18 DIAGNOSIS — M9901 Segmental and somatic dysfunction of cervical region: Secondary | ICD-10-CM | POA: Diagnosis not present

## 2021-12-18 DIAGNOSIS — M25551 Pain in right hip: Secondary | ICD-10-CM | POA: Diagnosis not present

## 2021-12-18 DIAGNOSIS — M9905 Segmental and somatic dysfunction of pelvic region: Secondary | ICD-10-CM | POA: Diagnosis not present

## 2021-12-18 DIAGNOSIS — M50322 Other cervical disc degeneration at C5-C6 level: Secondary | ICD-10-CM | POA: Diagnosis not present

## 2021-12-19 ENCOUNTER — Other Ambulatory Visit: Payer: Self-pay

## 2021-12-19 ENCOUNTER — Ambulatory Visit (INDEPENDENT_AMBULATORY_CARE_PROVIDER_SITE_OTHER): Payer: BC Managed Care – PPO | Admitting: Internal Medicine

## 2021-12-19 ENCOUNTER — Encounter: Payer: Self-pay | Admitting: Internal Medicine

## 2021-12-19 DIAGNOSIS — G4730 Sleep apnea, unspecified: Secondary | ICD-10-CM | POA: Diagnosis not present

## 2021-12-19 DIAGNOSIS — I272 Pulmonary hypertension, unspecified: Secondary | ICD-10-CM

## 2021-12-19 NOTE — Assessment & Plan Note (Addendum)
Body mass index is 36.91 kg/m.  -  Lab Results  Component Value Date   TSH 7.34 (H) 10/01/2021      Contributing to doe and risk of GERD >>>   reviewed the need and the process to achieve and maintain neg calorie balance > defer f/u primary care including intermittently monitoring thyroid status  On synthroid

## 2021-12-19 NOTE — Assessment & Plan Note (Addendum)
Referred to sleep medicine   Each maintenance medication was reviewed in detail including emphasizing most importantly the difference between maintenance and prns and under what circumstances the prns are to be triggered using an action plan format where appropriate.  Total time for H and P, chart review, counseling, reviewing studies to date,  and generating customized AVS unique to this new pt office visit / same day charting = 45 min

## 2021-12-19 NOTE — Patient Instructions (Addendum)
Avoid caffeine as much as possible  and take your metaprolol in am   Weight control is simply a matter of calorie balance which needs to be tilted in your favor by eating less and exercising more.  To get the most out of exercise, you need to be continuously aware that you are short of breath, but never out of breath, for 30 minutes daily. As you improve, it will actually be easier for you to do the same amount of exercise  in  30 minutes so always push to the level where you are short of breath.    Think of your calorie balance like you do your bank account where in this case you want the balance to go down so you must take in less calories than you burn up.  It's just that simple:  Hard to do, but easy to understand.  Good luck!   We are referring you to our sleep medicine doctor

## 2021-12-19 NOTE — Progress Notes (Signed)
Melanie Steele, female    DOB: Feb 03, 1961,    MRN: [id number]   Brief patient profile:  50  yobf never smoker works for [employer] referred to pulmonary clinic 12/19/2021 by Dr Harriet Masson  for ? Pulmonary Hypertension by CT cardiac scan done to w/u palpitations        History of Present Illness  12/19/2021  Pulmonary/ 1st office eval/Alven Alverio re palpitations x 3 years gradually more freq despite more consistent cpap though using same device 2015  Chief Complaint  Patient presents with   Pulmonary Consult    Referred by Dr. Philomena Course for possible pulmonary hypertension. She states not having any SOB but does get tired easily.   Dyspnea:  walking dog 10-15 min   4 x daily no hills /never sports in school Cough: none  Sleep: on back, flat prop on pillows, on cpap since 2009 / feels rested when uses it consistently  SABA use: none  Allegra/ one coffee per day  Wt same as 4 prior to OV  and with cpap 2009   No obvious day to day or daytime variability or assoc excess/ purulent sputum or mucus plugs or hemoptysis or cp or chest tightness, subjective wheeze or overt sinus or hb symptoms.   Sleeping s palpitation awareness . Also denies any obvious fluctuation of symptoms with weather or environmental changes or other aggravating or alleviating factors except as outlined above   No unusual exposure hx or h/o childhood pna/ asthma or knowledge of premature birth.  Current Allergies, Complete Past Medical History, Past Surgical History, Family History, and Social History were reviewed in [employer] record.  ROS  The following are not active complaints unless bolded Hoarseness, sore throat, dysphagia, dental problems, itching, sneezing,  nasal congestion or discharge of excess mucus or purulent secretions, ear ache,   fever, chills, sweats, unintended wt loss or wt gain, classically pleuritic or exertional cp,  orthopnea pnd or arm/hand swelling  or leg swelling, presyncope,  palpitations, abdominal pain, anorexia, nausea, vomiting, diarrhea  or change in bowel habits or change in bladder habits, change in stools or change in urine, dysuria, hematuria,  rash, arthralgias, visual complaints, headache, numbness, weakness or ataxia or problems with walking or coordination,  change in mood/anxious  or  memory.           Past Medical History:  Diagnosis Date   CPAP (continuous positive airway pressure) dependence    Hyperlipidemia    Hypertension    Sleep apnea    Type 2 diabetes mellitus (HCC)    Vitamin D deficiency     Outpatient Medications Prior to Visit  Medication Sig Dispense Refill   amLODipine (NORVASC) 10 MG tablet Take 1 tablet (10 mg total) by mouth daily. 90 tablet 0   Continuous Blood Gluc Sensor ([place]) MISC Apply one sensor every 14 days. Dx: E11.49 6 each 2   Dulaglutide (TRULICITY) 3 HM/0.9OB SOPN Inject 3 mg as directed once a week. 6 mL 3   empagliflozin (JARDIANCE) 25 MG TABS tablet Take 1 tablet (25 mg total) by mouth daily before breakfast. 90 tablet 3   EPINEPHrine 0.3 mg/0.3 mL IJ SOAJ injection Inject 0.3 mg IM as needed for allergic reaction 1 each 1   fexofenadine (ALLEGRA) 180 MG tablet Take 1 tablet by mouth daily.     gabapentin (NEURONTIN) 100 MG capsule Take 1 capsule (100 mg total) by mouth 3 (three) times daily as needed. 90 capsule 1  glipiZIDE (GLUCOTROL) 5 MG tablet Take 0.5 tablets (2.5 mg total) by mouth daily before breakfast AND 1 tablet (5 mg total) daily before supper. 135 tablet 3   glucose blood (ONETOUCH VERIO) test strip Daily 150 each 3   hydrochlorothiazide (HYDRODIURIL) 25 MG tablet Take 1 tablet (25 mg total) by mouth daily. 90 tablet 2   levothyroxine (SYNTHROID) 150 MCG tablet Take 1 tablet (150 mcg total) by mouth as directed. Take 2 tablets on Sundays, and 1 tablet the rest of the week 104 tablet 3   metoprolol succinate (TOPROL-XL) 50 MG 24 hr tablet TAKE 1 TABLET(50 MG) BY MOUTH  DAILY 90 tablet 1   Multiple Vitamin (MULTIVITAMIN) tablet Take 1 tablet by mouth daily.     potassium chloride SA (KLOR-CON M) 20 MEQ tablet Take 1 tablet (20 mEq total) by mouth daily. 90 tablet 0   rosuvastatin (CRESTOR) 10 MG tablet Take 1 tablet (10 mg total) by mouth daily. 90 tablet 3   fluticasone (FLONASE) 50 MCG/ACT nasal spray Place into both nostrils 2 (two) times daily. (Patient not taking: Reported on 12/19/2021)     ivabradine (CORLANOR) 5 MG TABS tablet Take 2 tablets 2 hours before CT scan. 2 tablet 0   metoprolol tartrate (LOPRESSOR) 100 MG tablet Take 2 hours prior to CT 1 tablet 0   No facility-administered medications prior to visit.     Objective:     BP 130/76 (BP Location: Left Arm, Cuff Size: Normal)    Pulse 86    Temp 98.6 F (37 C) (Oral)    Ht 5\' 5"  (1.651 m)    Wt 221 lb 12.8 oz (100.6 kg)    SpO2 96% Comment: on RA   BMI 36.91 kg/m   SpO2: 96 % (on RA)  HEENT : pt wearing mask not removed for exam due to covid -19 concerns.    NECK :  without JVD/Nodes/TM/ nl carotid upstrokes bilaterally   LUNGS: no acc muscle use,  Nl contour chest which is clear to A and P bilaterally without cough on insp or exp maneuvers   CV:  RRR  no s3 or murmur or increase in P2, and no edema   ABD:  soft and nontender with nl inspiratory excursion in the supine position. No bruits or organomegaly appreciated, bowel sounds nl  MS:  Nl gait/ ext warm without deformities, calf tenderness, cyanosis or clubbing No obvious joint restrictions   SKIN: warm and dry without lesions    NEURO:  alert, approp, nl sensorium with  no motor or cerebellar deficits apparent.    I personally reviewed images and agree with radiology impression as follows:   Chest CT chest cuts on coronary study dated 12/10/21  Vascular: Suggestion of mild dilatation of central pulmonary arteries with the main pulmonary artery measuring approximately 3.1 cm.   Mediastinum/Nodes: Visualized mediastinum  and hilar regions demonstrate no lymphadenopathy or masses.   Lungs/Pleura: Visualized lungs show no evidence of pulmonary edema, consolidation, pneumothorax, nodule or pleural fluid.    Assessment   Pulmonary hypertension (HCC) Echo nl R side 10/18/21  -  Suggested by CT coronary study 12/10/21  Main risk factor is longstanding OSA with admitted non-adherence in past and no recent titration studies so rec wt loss and f/u sleep medicine.   No need for V/Q or pfts at this point since PA pressures are still normal.   Morbid obesity complicated by dm/ hbp/ hyperlipidemia and OSA Body mass index is 36.91 kg/m.  -  Lab Results  Component Value Date   TSH 7.34 (H) 10/01/2021      Contributing to doe and risk of GERD >>>   reviewed the need and the process to achieve and maintain neg calorie balance > defer f/u primary care including intermittently monitoring thyroid status  On synthroid            Sleep apnea Referred to sleep medicine   Each maintenance medication was reviewed in detail including emphasizing most importantly the difference between maintenance and prns and under what circumstances the prns are to be triggered using an action plan format where appropriate.  Total time for H and P, chart review, counseling, reviewing studies to date,  and generating customized AVS unique to this new pt office visit / same day charting = 45 min               Christinia Gully, MD 12/19/2021

## 2021-12-19 NOTE — Assessment & Plan Note (Signed)
Echo nl R side 10/18/21  -  Suggested by CT coronary study 12/10/21  Main risk factor is longstanding OSA with admitted non-adherence in past and no recent titration studies so rec wt loss and f/u sleep medicine.   No need for V/Q or pfts at this point since PA pressures are still normal.

## 2021-12-20 ENCOUNTER — Ambulatory Visit (INDEPENDENT_AMBULATORY_CARE_PROVIDER_SITE_OTHER): Payer: BC Managed Care – PPO | Admitting: Pulmonary Disease

## 2021-12-20 ENCOUNTER — Encounter: Payer: Self-pay | Admitting: Pulmonary Disease

## 2021-12-20 VITALS — BP 126/78 | HR 99 | Temp 98.6°F | Ht 65.0 in | Wt 222.0 lb

## 2021-12-20 DIAGNOSIS — G4733 Obstructive sleep apnea (adult) (pediatric): Secondary | ICD-10-CM | POA: Diagnosis not present

## 2021-12-20 DIAGNOSIS — G4730 Sleep apnea, unspecified: Secondary | ICD-10-CM | POA: Diagnosis not present

## 2021-12-20 NOTE — Patient Instructions (Signed)
Continue using your current CPAP  We will get a prescription over to the medical supply company for auto titrating CPAP Current setting is 7-13 but prescription may be for 5-15  I will see you in 3 to 4 months  I will want to see you after you have started on a new CPAP, this allows [place] to keep a good eye on the efficiency of the machine and to ensure you are using it on a regular basis  Try and make sure you get at least 6 hours of sleep every night  Regular graded exercises

## 2021-12-20 NOTE — Progress Notes (Signed)
Melanie Steele    [id number]    1961/07/10  Primary Care Physician:Boscia, Greer Ee, NP  Referring Physician: Ronnell Freshwater, NP [address] [place],  [place] [address]  Chief complaint:   Patient being seen for obstructive sleep apnea  HPI:  Diagnosed with obstructive sleep apnea about 2009 Has been using CPAP since  Moved to [place] in 2019  Had a recent sleep study in 2020 showing moderately severe obstructive sleep apnea  Current machine is from about 2015-2016 -Current machine does not have a smart card  She was recently seen by Dr. Melvyn Novas for possible pulmonary hypertension, had a recent cardiac CT for palpitations  Denies any significant shortness of breath She does have a dog Does not smoke Drinks 1 caffeinated beverage a day  Sleep hours is after midnight a little bit later Falls asleep about 30 minutes Usually about 2 awakenings Final wake up time about 6 AM  her dad did snore   Outpatient Encounter Medications as of 12/20/2021  Medication Sig   amLODipine (NORVASC) 10 MG tablet Take 1 tablet (10 mg total) by mouth daily.   Continuous Blood Gluc Sensor ([place]) MISC Apply one sensor every 14 days. Dx: E11.49   Dulaglutide (TRULICITY) 3 JJ/9.4RD SOPN Inject 3 mg as directed once a week.   empagliflozin (JARDIANCE) 25 MG TABS tablet Take 1 tablet (25 mg total) by mouth daily before breakfast.   EPINEPHrine 0.3 mg/0.3 mL IJ SOAJ injection Inject 0.3 mg IM as needed for allergic reaction   fexofenadine (ALLEGRA) 180 MG tablet Take 1 tablet by mouth daily.   fluticasone (FLONASE) 50 MCG/ACT nasal spray Place into both nostrils 2 (two) times daily.   gabapentin (NEURONTIN) 100 MG capsule Take 1 capsule (100 mg total) by mouth 3 (three) times daily as needed.   glipiZIDE (GLUCOTROL) 5 MG tablet Take 0.5 tablets (2.5 mg total) by mouth daily before breakfast AND 1 tablet (5 mg total) daily before supper.   glucose  blood (ONETOUCH VERIO) test strip Daily   hydrochlorothiazide (HYDRODIURIL) 25 MG tablet Take 1 tablet (25 mg total) by mouth daily.   levothyroxine (SYNTHROID) 150 MCG tablet Take 1 tablet (150 mcg total) by mouth as directed. Take 2 tablets on Sundays, and 1 tablet the rest of the week   metoprolol succinate (TOPROL-XL) 50 MG 24 hr tablet TAKE 1 TABLET(50 MG) BY MOUTH DAILY   Multiple Vitamin (MULTIVITAMIN) tablet Take 1 tablet by mouth daily.   potassium chloride SA (KLOR-CON M) 20 MEQ tablet Take 1 tablet (20 mEq total) by mouth daily.   rosuvastatin (CRESTOR) 10 MG tablet Take 1 tablet (10 mg total) by mouth daily.   No facility-administered encounter medications on file as of 12/20/2021.    Allergies as of 12/20/2021 - Review Complete 12/20/2021  Allergen Reaction Noted   Lisinopril Anaphylaxis 02/25/2013   Other Anaphylaxis 02/23/2019   Linagliptin Other (See Comments) 04/14/2013   Metformin and related Diarrhea 06/29/2014    Past Medical History:  Diagnosis Date   CPAP (continuous positive airway pressure) dependence    Hyperlipidemia    Hypertension    Sleep apnea    Type 2 diabetes mellitus ([place])    Vitamin D deficiency     Past Surgical History:  Procedure Laterality Date   ABDOMINAL HYSTERECTOMY     TONSILLECTOMY     TOOTH EXTRACTION     TRIGGER FINGER RELEASE  Family History  Problem Relation Age of Onset   Diabetes Mother    Hypertension Mother    Stroke Father    Hypertension Sister     Social History   Socioeconomic History   Marital status: Widowed    Spouse name: Not on file   Number of children: Not on file   Years of education: Not on file   Highest education level: Not on file  Occupational History   Not on file  Tobacco Use   Smoking status: Never   Smokeless tobacco: Never  Vaping Use   Vaping Use: Never used  Substance and Sexual Activity   Alcohol use: Yes    Comment: occ   Drug use: Never   Sexual activity: Not Currently   Other Topics Concern   Not on file  Social History Narrative   Not on file   Social Determinants of Health   Financial Resource Strain: Not on file  Food Insecurity: Not on file  Transportation Needs: Not on file  Physical Activity: Not on file  Stress: Not on file  Social Connections: Not on file  Intimate Partner Violence: Not on file    Review of Systems  Respiratory:  Negative for shortness of breath.   Cardiovascular:  Negative for chest pain.  Psychiatric/Behavioral:  Positive for sleep disturbance.    Vitals:   12/20/21 1501  BP: 126/78  Pulse: 99  Temp: 98.6 F (37 C)  SpO2: 97%     Physical Exam Constitutional:      Appearance: She is obese.  HENT:     Head: Normocephalic.     Nose: Nose normal. No congestion.     Mouth/Throat:     Mouth: Mucous membranes are moist.  Cardiovascular:     Rate and Rhythm: Normal rate and regular rhythm.     Heart sounds: No murmur heard.   No friction rub.  Pulmonary:     Effort: No respiratory distress.     Breath sounds: No stridor. No wheezing or rhonchi.  Musculoskeletal:     Cervical back: No rigidity or tenderness.  Neurological:     Mental Status: She is alert.     Data Reviewed: Her most recent study 06/01/2019 showing moderately severe obstructive sleep apnea  Download from her machine not available at present  Assessment:  Severe obstructive sleep apnea  Compliant with CPAP use  Does notice significant improvement in symptoms with CPAP use  Inadequate sleep -Counseled about the need to sleep for at least 6 to 8 hours as able  Regular exercises encouraged  Plan/Recommendations: I will see her back in about 3 to 4 months  We will send a prescription to the DME company for a new CPAP Auto CPAP 5-15 -This ensures that we can monitor regularly and optimize treatment with CPAP  I will see her back after she gets a new CPAP  Encouraged to call with any significant concerns   Sherrilyn Rist  MD [facility] 12/20/2021, 3:28 PM  CC: Ronnell Freshwater, NP

## 2021-12-23 DIAGNOSIS — M25551 Pain in right hip: Secondary | ICD-10-CM | POA: Diagnosis not present

## 2021-12-23 DIAGNOSIS — M50322 Other cervical disc degeneration at C5-C6 level: Secondary | ICD-10-CM | POA: Diagnosis not present

## 2021-12-23 DIAGNOSIS — M9905 Segmental and somatic dysfunction of pelvic region: Secondary | ICD-10-CM | POA: Diagnosis not present

## 2021-12-23 DIAGNOSIS — M9901 Segmental and somatic dysfunction of cervical region: Secondary | ICD-10-CM | POA: Diagnosis not present

## 2021-12-26 DIAGNOSIS — M25551 Pain in right hip: Secondary | ICD-10-CM | POA: Diagnosis not present

## 2021-12-26 DIAGNOSIS — M9905 Segmental and somatic dysfunction of pelvic region: Secondary | ICD-10-CM | POA: Diagnosis not present

## 2021-12-26 DIAGNOSIS — M9901 Segmental and somatic dysfunction of cervical region: Secondary | ICD-10-CM | POA: Diagnosis not present

## 2021-12-26 DIAGNOSIS — M50322 Other cervical disc degeneration at C5-C6 level: Secondary | ICD-10-CM | POA: Diagnosis not present

## 2021-12-31 DIAGNOSIS — M50322 Other cervical disc degeneration at C5-C6 level: Secondary | ICD-10-CM | POA: Diagnosis not present

## 2021-12-31 DIAGNOSIS — M25551 Pain in right hip: Secondary | ICD-10-CM | POA: Diagnosis not present

## 2021-12-31 DIAGNOSIS — M9901 Segmental and somatic dysfunction of cervical region: Secondary | ICD-10-CM | POA: Diagnosis not present

## 2021-12-31 DIAGNOSIS — M9905 Segmental and somatic dysfunction of pelvic region: Secondary | ICD-10-CM | POA: Diagnosis not present

## 2022-01-02 DIAGNOSIS — M9901 Segmental and somatic dysfunction of cervical region: Secondary | ICD-10-CM | POA: Diagnosis not present

## 2022-01-02 DIAGNOSIS — M25551 Pain in right hip: Secondary | ICD-10-CM | POA: Diagnosis not present

## 2022-01-02 DIAGNOSIS — M50322 Other cervical disc degeneration at C5-C6 level: Secondary | ICD-10-CM | POA: Diagnosis not present

## 2022-01-02 DIAGNOSIS — M9905 Segmental and somatic dysfunction of pelvic region: Secondary | ICD-10-CM | POA: Diagnosis not present

## 2022-01-09 ENCOUNTER — Other Ambulatory Visit (HOSPITAL_COMMUNITY): Payer: Self-pay

## 2022-01-14 DIAGNOSIS — M50322 Other cervical disc degeneration at C5-C6 level: Secondary | ICD-10-CM | POA: Diagnosis not present

## 2022-01-14 DIAGNOSIS — M9901 Segmental and somatic dysfunction of cervical region: Secondary | ICD-10-CM | POA: Diagnosis not present

## 2022-01-14 DIAGNOSIS — M25551 Pain in right hip: Secondary | ICD-10-CM | POA: Diagnosis not present

## 2022-01-14 DIAGNOSIS — M9905 Segmental and somatic dysfunction of pelvic region: Secondary | ICD-10-CM | POA: Diagnosis not present

## 2022-01-16 DIAGNOSIS — G4733 Obstructive sleep apnea (adult) (pediatric): Secondary | ICD-10-CM | POA: Diagnosis not present

## 2022-01-21 ENCOUNTER — Other Ambulatory Visit: Payer: Self-pay | Admitting: Internal Medicine

## 2022-01-21 DIAGNOSIS — M9905 Segmental and somatic dysfunction of pelvic region: Secondary | ICD-10-CM | POA: Diagnosis not present

## 2022-01-21 DIAGNOSIS — M50322 Other cervical disc degeneration at C5-C6 level: Secondary | ICD-10-CM | POA: Diagnosis not present

## 2022-01-21 DIAGNOSIS — M9901 Segmental and somatic dysfunction of cervical region: Secondary | ICD-10-CM | POA: Diagnosis not present

## 2022-01-21 DIAGNOSIS — M25551 Pain in right hip: Secondary | ICD-10-CM | POA: Diagnosis not present

## 2022-01-24 ENCOUNTER — Other Ambulatory Visit: Payer: Self-pay | Admitting: Internal Medicine

## 2022-01-28 DIAGNOSIS — M9901 Segmental and somatic dysfunction of cervical region: Secondary | ICD-10-CM | POA: Diagnosis not present

## 2022-01-28 DIAGNOSIS — M50322 Other cervical disc degeneration at C5-C6 level: Secondary | ICD-10-CM | POA: Diagnosis not present

## 2022-01-28 DIAGNOSIS — M9905 Segmental and somatic dysfunction of pelvic region: Secondary | ICD-10-CM | POA: Diagnosis not present

## 2022-01-28 DIAGNOSIS — M25551 Pain in right hip: Secondary | ICD-10-CM | POA: Diagnosis not present

## 2022-02-04 ENCOUNTER — Ambulatory Visit: Payer: BC Managed Care – PPO | Admitting: Internal Medicine

## 2022-02-04 ENCOUNTER — Encounter: Payer: Self-pay | Admitting: Internal Medicine

## 2022-02-04 VITALS — BP 134/82 | HR 93 | Ht 65.0 in | Wt 225.0 lb

## 2022-02-04 DIAGNOSIS — E1165 Type 2 diabetes mellitus with hyperglycemia: Secondary | ICD-10-CM | POA: Diagnosis not present

## 2022-02-04 DIAGNOSIS — E785 Hyperlipidemia, unspecified: Secondary | ICD-10-CM | POA: Diagnosis not present

## 2022-02-04 DIAGNOSIS — E119 Type 2 diabetes mellitus without complications: Secondary | ICD-10-CM

## 2022-02-04 DIAGNOSIS — M9901 Segmental and somatic dysfunction of cervical region: Secondary | ICD-10-CM | POA: Diagnosis not present

## 2022-02-04 DIAGNOSIS — M50322 Other cervical disc degeneration at C5-C6 level: Secondary | ICD-10-CM | POA: Diagnosis not present

## 2022-02-04 DIAGNOSIS — E890 Postprocedural hypothyroidism: Secondary | ICD-10-CM | POA: Diagnosis not present

## 2022-02-04 DIAGNOSIS — M9905 Segmental and somatic dysfunction of pelvic region: Secondary | ICD-10-CM | POA: Diagnosis not present

## 2022-02-04 DIAGNOSIS — M25551 Pain in right hip: Secondary | ICD-10-CM | POA: Diagnosis not present

## 2022-02-04 LAB — POCT GLYCOSYLATED HEMOGLOBIN (HGB A1C): Hemoglobin A1C: 9.4 % — AB (ref 4.0–5.6)

## 2022-02-04 LAB — LIPID PANEL
Cholesterol: 187 mg/dL (ref 0–200)
HDL: 47.1 mg/dL (ref >39.00)
LDL Cholesterol: 103 mg/dL — ABNORMAL HIGH (ref 0–99)
NonHDL: 139.63
Total CHOL/HDL Ratio: 4
Triglycerides: 181 mg/dL — ABNORMAL HIGH (ref 0.0–149.0)
VLDL: 36.2 mg/dL (ref 0.0–40.0)

## 2022-02-04 LAB — COMPREHENSIVE METABOLIC PANEL
ALT: 15 U/L (ref 0–35)
AST: 14 U/L (ref 0–37)
Albumin: 4.4 g/dL (ref 3.5–5.2)
Alkaline Phosphatase: 53 U/L (ref 39–117)
BUN: 10 mg/dL (ref 6–23)
CO2: 28 mEq/L (ref 19–32)
Calcium: 9.6 mg/dL (ref 8.4–10.5)
Chloride: 104 mEq/L (ref 96–112)
Creatinine, Ser: 0.54 mg/dL (ref 0.40–1.20)
GFR: 100.31 mL/min (ref >60.00)
Glucose, Bld: 155 mg/dL — ABNORMAL HIGH (ref 70–99)
Potassium: 4 mEq/L (ref 3.5–5.1)
Sodium: 142 mEq/L (ref 135–145)
Total Bilirubin: 0.9 mg/dL (ref 0.2–1.2)
Total Protein: 7.3 g/dL (ref 6.0–8.3)

## 2022-02-04 LAB — TSH: TSH: 2.06 u[IU]/mL (ref 0.35–5.50)

## 2022-02-04 MED ORDER — EMPAGLIFLOZIN 25 MG PO TABS
25.0000 mg | ORAL_TABLET | Freq: Every day | ORAL | 3 refills | Status: DC
Start: 2022-02-04 — End: 2023-04-17

## 2022-02-04 MED ORDER — FREESTYLE LIBRE 2 SENSOR MISC: 1.0000 | 3 refills | Status: DC

## 2022-02-04 MED ORDER — GLIPIZIDE 5 MG PO TABS: 5.0000 mg | ORAL_TABLET | Freq: Two times a day (BID) | ORAL | 3 refills | Status: DC

## 2022-02-04 MED ORDER — SEMAGLUTIDE (2 MG/DOSE) 8 MG/3ML ~~LOC~~ SOPN: 2.0000 mg | PEN_INJECTOR | SUBCUTANEOUS | 3 refills | Status: DC

## 2022-02-04 NOTE — Progress Notes (Signed)
Name: Melanie Steele  Age/ Sex: 61 y.o., female   MRN/ DOB: [id number], Oct 18, 1961     PCP: Ronnell Freshwater, NP   Reason for Endocrinology Evaluation: Type 2 Diabetes Mellitus  Initial Endocrine Consultative Visit: 08/21/2021    PATIENT IDENTIFIER: [name] is a 61 y.o. female with a past medical history of T2DM, OSA, HTN and Dyslipidemia. The patient has followed with Endocrinology clinic since 08/21/2020 for consultative assistance with management of her diabetes.  DIABETIC HISTORY:  Ms. Broerman was diagnosed with DM many years ago. Metformin-  Tearing stomach   Her hemoglobin A1c has ranged from 7.9% in 2020, peaking at 9.8% in 2021.  On her initial visit to our clinic she had an A1c 9.8%, she was on Invokana and Trulicity, we started Glipizide      THYROID HISTORY: Has been diagnosed with hyperthyroidism in 1993 secondary to Graves' disease . She is S/P RAI ablation, followed by LT- 4 replacement. She was on levothyroxine initially and switched to armour a few years ago ( ~ 2019) . She has been having issues with adjusting her levels.  By 08/2021 we switched from Armour to Levothyroxine      Father with thyroid disease    SUBJECTIVE:   During the last visit (05/28/2021): A1c 9.3% . We changed Glipizide,continued Jardiance and  Trulicity    Today (01/24/4708): [name] is here for a follow up on diabetes and hypothyroidism.  She checks her blood sugars multiple times daily through CGM . The patient has  had hypoglycemic episodes since the last clinic visit, which she attributes to not eating any carbs one day.    Denies nausea , vomiting or  diarrhea  Saw pulmonary 12/2021 for pulmonary hTN ,and was referred to sleep medicine  Had EKG showing mild MI  Daughter with CVA at age 35  She has been having issues obtaining the Trulicity due to Shortage      HOME ENDOCRINE  REGIMEN:  Glipizide 5 mg , half a tablet before Breakfast and 1 tablet before supper   Jardiance 25 mg ,1 tablet in the morning Trulicity 3 mg weekly  Levothyroxine 150 mcg ,2 tablets on sundays and 1 tablet rest of the week- pt continues to take 1 tablet  Rosuvastatin 10 mg daily      Statin: yes ACE-I/ARB: no    CONTINUOUS GLUCOSE MONITORING RECORD INTERPRETATION    Dates of Recording: incorrect dates    2/21 /2023 6:39 am 125 8:46 am 208 9:20 am 177       DIABETIC COMPLICATIONS: Microvascular complications:   Denies: CKD, retinopathy, neuropathy  Last Eye Exam: Completed 09/14/2020  Macrovascular complications:   Denies: CAD, CVA, PVD   HISTORY:  Past Medical History:  Past Medical History:  Diagnosis Date   CPAP (continuous positive airway pressure) dependence    Hyperlipidemia    Hypertension    Sleep apnea    Type 2 diabetes mellitus ([place])    Vitamin D deficiency    Past Surgical History:  Past Surgical History:  Procedure Laterality Date   ABDOMINAL HYSTERECTOMY     TONSILLECTOMY     TOOTH EXTRACTION     TRIGGER FINGER RELEASE     Social History:  reports that she has never smoked. She has never used smokeless tobacco. She reports current alcohol use. She reports that she does not use drugs. Family History:  Family History  Problem Relation Age of Onset   Diabetes Mother  Hypertension Mother    Stroke Father    Hypertension Sister      HOME MEDICATIONS: Allergies as of 02/04/2022       Reactions   Lisinopril Anaphylaxis   Other Anaphylaxis   Tree nuts   Linagliptin Other (See Comments)   Upset stomach   Metformin And Related Diarrhea        Medication List        Accurate as of February 04, 2022  9:20 AM. If you have any questions, ask your nurse or doctor.          STOP taking these medications    Trulicity 3 VZ/5.6LO Sopn Generic drug: Dulaglutide Stopped by: Dorita Sciara, MD       TAKE these medications    amLODipine 10 MG tablet Commonly known as: NORVASC Take 1 tablet (10 mg  total) by mouth daily.   EPINEPHrine 0.3 mg/0.3 mL Soaj injection Commonly known as: EPI-PEN Inject 0.3 mg IM as needed for allergic reaction   fexofenadine 180 MG tablet Commonly known as: ALLEGRA Take 1 tablet by mouth daily.   fluticasone 50 MCG/ACT nasal spray Commonly known as: FLONASE Place into both nostrils 2 (two) times daily.   FreeStyle [employer] Apply one sensor every 14 days. Dx: E11.49   gabapentin 100 MG capsule Commonly known as: NEURONTIN Take 1 capsule (100 mg total) by mouth 3 (three) times daily as needed.   glipiZIDE 5 MG tablet Commonly known as: GLUCOTROL Take 0.5 tablets (2.5 mg total) by mouth daily before breakfast AND 1 tablet (5 mg total) daily before supper.   hydrochlorothiazide 25 MG tablet Commonly known as: HYDRODIURIL Take 1 tablet (25 mg total) by mouth daily.   Jardiance 25 MG Tabs tablet Generic drug: empagliflozin TAKE 1 TABLET BY MOUTH DAILY   levothyroxine 150 MCG tablet Commonly known as: SYNTHROID Take 1 tablet (150 mcg total) by mouth as directed. Take 2 tablets on Sundays, and 1 tablet the rest of the week   metoprolol succinate 50 MG 24 hr tablet Commonly known as: TOPROL-XL TAKE 1 TABLET(50 MG) BY MOUTH DAILY   multivitamin tablet Take 1 tablet by mouth daily.   OneTouch Verio test strip Generic drug: glucose blood Daily   potassium chloride SA 20 MEQ tablet Commonly known as: KLOR-CON M Take 1 tablet (20 mEq total) by mouth daily.   rosuvastatin 10 MG tablet Commonly known as: Crestor Take 1 tablet (10 mg total) by mouth daily.         OBJECTIVE:   Vital Signs: BP 134/82 (BP Location: Left Arm, Patient Position: Sitting, Cuff Size: Small)    Pulse 93    Ht 5\' 5"  (1.651 m)    Wt 225 lb (102.1 kg)    SpO2 96%    BMI 37.44 kg/m   Wt Readings from Last 3 Encounters:  02/04/22 225 lb (102.1 kg)  12/20/21 222 lb (100.7 kg)  12/19/21 221 lb 12.8 oz (100.6 kg)     Exam: General: Pt appears  well and is in NAD  Lungs: Clear with good BS bilat with no rales, rhonchi, or wheezes  Heart: RRR with normal S1 and S2 and no gallops; no murmurs; no rub  Abdomen: Normoactive bowel sounds, soft, nontender, without masses or organomegaly palpable  Extremities: No pretibial edema.   Neuro: MS is good with appropriate affect, pt is alert and Ox3    DM foot exam: 05/28/2021   The skin of the feet is  intact without sores or ulcerations. The pedal pulses are 2+ on right and 2+ on left. The sensation is intact to a screening 5.07, 10 gram monofilament bilaterally    DATA REVIEWED:  Lab Results  Component Value Date   HGBA1C 9.4 (A) 02/04/2022   HGBA1C 9.3 (A) 10/01/2021   HGBA1C 9.4 (A) 05/28/2021    Latest Reference Range & Units 02/04/22 09:42  Sodium 135 - 145 mEq/L 142  Potassium 3.5 - 5.1 mEq/L 4.0  Chloride 96 - 112 mEq/L 104  CO2 19 - 32 mEq/L 28  Glucose 70 - 99 mg/dL 155 (H)  BUN 6 - 23 mg/dL 10  Creatinine 0.40 - 1.20 mg/dL 0.54  Calcium 8.4 - 10.5 mg/dL 9.6  Alkaline Phosphatase 39 - 117 U/L 53  Albumin 3.5 - 5.2 g/dL 4.4  AST 0 - 37 U/L 14  ALT 0 - 35 U/L 15  Total Protein 6.0 - 8.3 g/dL 7.3  Total Bilirubin 0.2 - 1.2 mg/dL 0.9  GFR >60.00 mL/min 100.31  Total CHOL/HDL Ratio  4  Cholesterol 0 - 200 mg/dL 187  HDL Cholesterol >39.00 mg/dL 47.10  LDL (calc) 0 - 99 mg/dL 103 (H)  NonHDL  139.63  Triglycerides 0.0 - 149.0 mg/dL 181.0 (H)  VLDL 0.0 - 40.0 mg/dL 36.2    Latest Reference Range & Units 02/04/22 09:42  TSH 0.35 - 5.50 uIU/mL 2.06     ASSESSMENT / PLAN / RECOMMENDATIONS:   1) Type 2 Diabetes Mellitus, Poorly controlled, Without complications - Most recent A1c of 9.3 %. Goal A1c < 7.0 %.   - A1c stable but continues to be above goal, despite escalating glycemic agents  - Will increase Glipizide as below  - Will switch trulicity to Ozempic as she is having a difficult time obtaining it   MEDICATIONS: - Increase Glipizide 5 mg , 1 tablet before  breakfast and 1 tablet before Supper  - Continue Jardiance 25 mg daily  - Will switch Trulicity 3 mg weekly to Ozempic 2 mg weekly     EDUCATION / INSTRUCTIONS: BG monitoring instructions: Patient is instructed to check her blood sugars 3 times a day, before meals  Call [facility] if: BG persistently < 70  I reviewed the Rule of 15 for the treatment of hypoglycemia in detail with the patient. Literature supplied.    2) Diabetic complications:  Eye: Does not have known diabetic retinopathy.  Neuro/ Feet: Does not have known diabetic peripheral neuropathy .  Renal: Patient does not have known baseline CKD. She   is not  on an ACEI/ARB at present.     3) Postablative Hypothyroidism:   - No local neck symptoms  -- She is taking it appropriately   - TSH is normal    Medication:    Levothyroxine 150 mcg, 1 tablet daily    4) Dyslipidemia:   -LDL was above goal,and I increased rosuvastatin from 5 to 10 mg, she is not sure about the dose change.  -Repeat lipid panel continues to show TG and LDL above goal -We will emphasize compliance and taking the proper dose    Medication Continue rosuvastatin 10 mg daily   F/U in 4 months    Signed electronically by: Mack Guise, MD  [facility]  [facility] [address] [place], [place] [address] Phone: [phone] FAX: [phone]   CC: Ronnell Freshwater, NP [place] [address] Phone: [phone]  Fax:  [phone]  Return to Endocrinology clinic as below: Future Appointments  Date Time Provider [place]  02/24/2022  8:30 AM Ronnell Freshwater, NP [facility] None  03/13/2022  3:00 PM [facility] NURSE [facility] None  03/27/2022  4:30 PM Laurin Coder, MD [facility] None

## 2022-02-04 NOTE — Patient Instructions (Addendum)
-   Increase Glipizide 5 mg ,1 tablet before Breakfast and  1 tablet before Supper  - Continue Jardiance 25 mg weekly  - Will switch Trulicity to Ozempic 2 mg once weekly       HOW TO TREAT LOW BLOOD SUGARS (Blood sugar LESS THAN 70 MG/DL) Please follow the RULE OF 15 for the treatment of hypoglycemia treatment (when your (blood sugars are less than 70 mg/dL)   STEP 1: Take 15 grams of carbohydrates when your blood sugar is low, which includes:  3-4 GLUCOSE TABS  OR 3-4 OZ OF JUICE OR REGULAR SODA OR ONE TUBE OF GLUCOSE GEL    STEP 2: RECHECK blood sugar in 15 MINUTES STEP 3: If your blood sugar is still low at the 15 minute recheck --> then, go back to STEP 1 and treat AGAIN with another 15 grams of carbohydrates.

## 2022-02-05 ENCOUNTER — Encounter: Payer: Self-pay | Admitting: Internal Medicine

## 2022-02-05 MED ORDER — LEVOTHYROXINE SODIUM 150 MCG PO TABS: 150.0000 ug | ORAL_TABLET | Freq: Every day | ORAL | 3 refills | Status: DC

## 2022-02-05 MED ORDER — ROSUVASTATIN CALCIUM 10 MG PO TABS: 10.0000 mg | ORAL_TABLET | Freq: Every day | ORAL | 3 refills | Status: DC

## 2022-02-06 ENCOUNTER — Telehealth: Payer: Self-pay

## 2022-02-06 ENCOUNTER — Other Ambulatory Visit (HOSPITAL_COMMUNITY): Payer: Self-pay

## 2022-02-06 ENCOUNTER — Other Ambulatory Visit: Payer: Self-pay

## 2022-02-06 ENCOUNTER — Other Ambulatory Visit: Payer: Self-pay | Admitting: Internal Medicine

## 2022-02-06 MED ORDER — TRULICITY 1.5 MG/0.5ML ~~LOC~~ SOAJ: 1.5000 mg | SUBCUTANEOUS | 3 refills | Status: DC

## 2022-02-06 NOTE — Telephone Encounter (Signed)
Patient states that Melanie Steele has the Trulicity 1.5 mg dose for a 3 month supply. She is unable to get the Ozempic because they don't have it.

## 2022-02-07 NOTE — Telephone Encounter (Signed)
Patient picked up the Trulicity

## 2022-02-13 DIAGNOSIS — G4733 Obstructive sleep apnea (adult) (pediatric): Secondary | ICD-10-CM | POA: Diagnosis not present

## 2022-02-17 ENCOUNTER — Other Ambulatory Visit: Payer: Self-pay | Admitting: Cardiology

## 2022-02-17 DIAGNOSIS — I10 Essential (primary) hypertension: Secondary | ICD-10-CM

## 2022-02-24 ENCOUNTER — Other Ambulatory Visit: Payer: Self-pay

## 2022-02-24 ENCOUNTER — Encounter: Payer: Self-pay | Admitting: Nurse Practitioner

## 2022-02-24 ENCOUNTER — Ambulatory Visit: Payer: BC Managed Care – PPO | Admitting: Nurse Practitioner

## 2022-02-24 VITALS — BP 104/66 | HR 81 | Temp 98.2°F | Ht 65.0 in | Wt 222.9 lb

## 2022-02-24 DIAGNOSIS — Z6837 Body mass index (BMI) 37.0-37.9, adult: Secondary | ICD-10-CM | POA: Diagnosis not present

## 2022-02-24 DIAGNOSIS — Z1231 Encounter for screening mammogram for malignant neoplasm of breast: Secondary | ICD-10-CM

## 2022-02-24 DIAGNOSIS — E1149 Type 2 diabetes mellitus with other diabetic neurological complication: Secondary | ICD-10-CM | POA: Diagnosis not present

## 2022-02-24 DIAGNOSIS — I10 Essential (primary) hypertension: Secondary | ICD-10-CM

## 2022-02-24 DIAGNOSIS — Z794 Long term (current) use of insulin: Secondary | ICD-10-CM

## 2022-02-24 MED ORDER — POTASSIUM CHLORIDE CRYS ER 20 MEQ PO TBCR: 20.0000 meq | EXTENDED_RELEASE_TABLET | Freq: Every day | ORAL | 1 refills | Status: DC

## 2022-02-24 MED ORDER — HYDROCHLOROTHIAZIDE 25 MG PO TABS: 25.0000 mg | ORAL_TABLET | Freq: Every day | ORAL | 1 refills | Status: DC

## 2022-02-24 NOTE — Progress Notes (Signed)
Established patient visit ? ? ?Patient: Melanie Steele   DOB: [birth year]   61 y.o. Female  MRN: [id number] ?Visit Date: 02/24/2022 ? ? ?Chief Complaint  ?Patient presents with  ? Hypertension  ? ?Subjective  ?  ?Hypertension ?Pertinent negatives include no chest pain, headaches, palpitations or shortness of breath.   ?The patient is here for routine follow up of hypertension. Has seen cardiology since her last visit with me. Blood pressure is doing much better.  Did consult with pulmonologist due to concern for pulmonary hypertension.  This was not diagnosed and she needs no follow up with cardiology only. Has had some emotional stress over the last month, but is gradually getting over.  ?She has no new concerns or complaints.  ?Needs new order for mammogram.  ? ? ?Medications: ?Outpatient Medications Prior to Visit  ?Medication Sig  ? amLODipine (NORVASC) 10 MG tablet Take 1 tablet (10 mg total) by mouth daily.  ? Continuous Blood Gluc Sensor (FREESTYLE LIBRE 2 SENSOR) MISC 1 Device by Does not apply route every 14 (fourteen) days.  ? Dulaglutide (TRULICITY) 1.5 WL/7.9GX SOPN Inject 1.5 mg into the skin once a week.  ? empagliflozin (JARDIANCE) 25 MG TABS tablet Take 1 tablet (25 mg total) by mouth daily.  ? EPINEPHrine 0.3 mg/0.3 mL IJ SOAJ injection Inject 0.3 mg IM as needed for allergic reaction  ? fexofenadine (ALLEGRA) 180 MG tablet Take 1 tablet by mouth daily.  ? fluticasone (FLONASE) 50 MCG/ACT nasal spray Place into both nostrils 2 (two) times daily.  ? gabapentin (NEURONTIN) 100 MG capsule Take 1 capsule (100 mg total) by mouth 3 (three) times daily as needed.  ? glipiZIDE (GLUCOTROL) 5 MG tablet Take 1 tablet (5 mg total) by mouth 2 (two) times daily before a meal.  ? glucose blood (ONETOUCH VERIO) test strip Daily  ? levothyroxine (SYNTHROID) 150 MCG tablet Take 1 tablet (150 mcg total) by mouth daily before breakfast. Take 2 tablets on Sundays, and 1 tablet the rest of the week  ? metoprolol  succinate (TOPROL-XL) 50 MG 24 hr tablet TAKE 1 TABLET(50 MG) BY MOUTH DAILY  ? Multiple Vitamin (MULTIVITAMIN) tablet Take 1 tablet by mouth daily.  ? rosuvastatin (CRESTOR) 10 MG tablet Take 1 tablet (10 mg total) by mouth daily.  ? [DISCONTINUED] hydrochlorothiazide (HYDRODIURIL) 25 MG tablet Take 1 tablet (25 mg total) by mouth daily.  ? [DISCONTINUED] potassium chloride SA (KLOR-CON M) 20 MEQ tablet Take 1 tablet (20 mEq total) by mouth daily.  ? ?No facility-administered medications prior to visit.  ? ? ?Review of Systems  ?Constitutional:  Negative for activity change, appetite change, chills, fatigue and fever.  ?HENT:  Negative for congestion, postnasal drip, rhinorrhea, sinus pressure, sinus pain, sneezing and sore throat.   ?Eyes: Negative.   ?Respiratory:  Negative for cough, chest tightness, shortness of breath and wheezing.   ?Cardiovascular:  Negative for chest pain and palpitations.  ?Gastrointestinal:  Negative for abdominal pain, constipation, diarrhea, nausea and vomiting.  ?Endocrine: Negative for cold intolerance, heat intolerance, polydipsia and polyuria.  ?     Sees endocrinology for control of blood sugars   ?Genitourinary:  Negative for dyspareunia, dysuria, flank pain, frequency and urgency.  ?Musculoskeletal:  Negative for arthralgias, back pain and myalgias.  ?Skin:  Negative for rash.  ?Allergic/Immunologic: Negative for environmental allergies.  ?Neurological:  Negative for dizziness, weakness and headaches.  ?Hematological:  Negative for adenopathy.  ?Psychiatric/Behavioral:  The patient is not nervous/anxious.   ? ? ?  Objective  ?  ? ?Today's Vitals  ? 02/24/22 0843  ?BP: 104/66  ?Pulse: 81  ?Temp: 98.2 ?F (36.8 ?C)  ?SpO2: 95%  ?Weight: 222 lb 14.4 oz (101.1 kg)  ?Height: '5\' 5"'$  (1.651 m)  ? ?Body mass index is 37.09 kg/m?.  ? ?BP Readings from Last 3 Encounters:  ?02/24/22 104/66  ?02/04/22 134/82  ?12/20/21 126/78  ?  ?Wt Readings from Last 3 Encounters:  ?02/24/22 222 lb 14.4 oz  (101.1 kg)  ?02/04/22 225 lb (102.1 kg)  ?12/20/21 222 lb (100.7 kg)  ?  ?Physical Exam ?Vitals and nursing note reviewed.  ?Constitutional:   ?   Appearance: Normal appearance. She is well-developed. She is obese.  ?HENT:  ?   Head: Normocephalic and atraumatic.  ?Eyes:  ?   Pupils: Pupils are equal, round, and reactive to light.  ?Neck:  ?   Vascular: No carotid bruit.  ?Cardiovascular:  ?   Rate and Rhythm: Normal rate and regular rhythm.  ?   Pulses: Normal pulses.  ?   Heart sounds: Normal heart sounds.  ?Pulmonary:  ?   Effort: Pulmonary effort is normal.  ?   Breath sounds: Normal breath sounds.  ?Abdominal:  ?   Palpations: Abdomen is soft.  ?Musculoskeletal:     ?   General: Normal range of motion.  ?   Cervical back: Normal range of motion and neck supple.  ?Lymphadenopathy:  ?   Cervical: No cervical adenopathy.  ?Skin: ?   General: Skin is warm and dry.  ?   Capillary Refill: Capillary refill takes less than 2 seconds.  ?Neurological:  ?   General: No focal deficit present.  ?   Mental Status: She is alert and oriented to person, place, and time.  ?Psychiatric:     ?   Mood and Affect: Mood normal.     ?   Behavior: Behavior normal.     ?   Thought Content: Thought content normal.     ?   Judgment: Judgment normal.  ?  ? Assessment & Plan  ?  ?1. Essential hypertension ?Improved. Continue bp medications as prescribed. Refills provided today. Regular visits with cardiology should be continued  ?- hydrochlorothiazide (HYDRODIURIL) 25 MG tablet; Take 1 tablet (25 mg total) by mouth daily.  Dispense: 90 tablet; Refill: 1 ?- potassium chloride SA (KLOR-CON M) 20 MEQ tablet; Take 1 tablet (20 mEq total) by mouth daily.  Dispense: 90 tablet; Refill: 1 ? ?2. Type 2 diabetes mellitus with other neurologic complication, with long-term current use of insulin ([place]) ?Patient should continue to see endocrinology as scheduled  ? ?3. Body mass index (BMI) of 37.0-37.9 in adult ?Discussed lowering calorie intake to 1500  calories per day and incorporating exercise into daily routine to help lose weight.  ? ?4. Encounter for screening mammogram for malignant neoplasm of breast ?Screening mammogram reordered today  ?- MM DIGITAL SCREENING BILATERAL; Future  ? ? ?Problem List Items Addressed This Visit   ? ?  ? Cardiovascular and Mediastinum  ? Essential hypertension - Primary  ? Relevant Medications  ? hydrochlorothiazide (HYDRODIURIL) 25 MG tablet  ? potassium chloride SA (KLOR-CON M) 20 MEQ tablet  ?  ? Endocrine  ? Diabetes mellitus ([place])  ?  ? Other  ? Body mass index (BMI) of 37.0-37.9 in adult  ? ?Other Visit Diagnoses   ? ? Encounter for screening mammogram for malignant neoplasm of breast      ? Relevant Orders  ?  MM DIGITAL SCREENING BILATERAL  ? ?  ?  ? ?Return in about 4 months (around 06/26/2022) for blood pressure.  ?   ? ? ? ? ?Ronnell Freshwater, NP  ?[facility] ?[phone] (phone) ?[phone] (fax) ? ?[facility]  ?

## 2022-02-24 NOTE — Patient Instructions (Signed)
Fat and Cholesterol Restricted Eating Plan Getting too much fat and cholesterol in your diet may cause health problems. Choosing the right foods helps keep your fat and cholesterol at normal levels. This can keep you from getting certain diseases. Your doctor may recommend an eating plan that includes: Total fat: ______% or less of total calories a day. This is ______g of fat a day. Saturated fat: ______% or less of total calories a day. This is ______g of saturated fat a day. Cholesterol: less than _________mg a day. Fiber: ______g a day. What are tips for following this plan? General tips Work with your doctor to lose weight if you need to. Avoid: Foods with added sugar. Fried foods. Foods with trans fat or partially hydrogenated oils. This includes some margarines and baked goods. If you drink alcohol: Limit how much you have to: 0-1 drink a day for women who are not pregnant. 0-2 drinks a day for men. Know how much alcohol is in a drink. In the [place] one drink equals one 12 oz bottle of beer (355 mL), one 5 oz glass of wine (148 mL), or one 1 oz glass of hard liquor (44 mL). Reading food labels Check food labels for: Trans fats. Partially hydrogenated oils. Saturated fat (g) in each serving. Cholesterol (mg) in each serving. Fiber (g) in each serving. Choose foods with healthy fats, such as: Monounsaturated fats and polyunsaturated fats. These include olive and canola oil, flaxseeds, walnuts, almonds, and seeds. Omega-3 fats. These are found in certain fish, flaxseed oil, and ground flaxseeds. Choose grain products that have whole grains. Look for the word "whole" as the first word in the ingredient list. Cooking Cook foods using low-fat methods. These include baking, boiling, grilling, and broiling. Eat more home-cooked foods. Eat at restaurants and buffets less often. Eat less fast food. Avoid cooking using saturated fats, such as butter, cream, palm oil, palm kernel oil, and  coconut oil. Meal planning  At meals, divide your plate into four equal parts: Fill one-half of your plate with vegetables, green salads, and fruit. Fill one-fourth of your plate with whole grains. Fill one-fourth of your plate with low-fat (lean) protein foods. Eat fish that is high in omega-3 fats at least two times a week. This includes mackerel, tuna, sardines, and salmon. Eat foods that are high in fiber, such as whole grains, beans, apples, pears, berries, broccoli, carrots, peas, and barley. What foods should I eat? Fruits All fresh, canned (in natural juice), or frozen fruits. Vegetables Fresh or frozen vegetables (raw, steamed, roasted, or grilled). Green salads. Grains Whole grains, such as whole wheat or whole grain breads, crackers, cereals, and pasta. Unsweetened oatmeal, bulgur, barley, quinoa, or brown rice. Corn or whole wheat flour tortillas. Meats and other protein foods Ground beef (85% or leaner), grass-fed beef, or beef trimmed of fat. Skinless chicken or turkey. Ground chicken or turkey. Pork trimmed of fat. All fish and seafood. Egg whites. Dried beans, peas, or lentils. Unsalted nuts or seeds. Unsalted canned beans. Nut butters without added sugar or oil. Dairy Low-fat or nonfat dairy products, such as skim or 1% milk, 2% or reduced-fat cheeses, low-fat and fat-free ricotta or cottage cheese, or plain low-fat and nonfat yogurt. Fats and oils Tub margarine without trans fats. Light or reduced-fat mayonnaise and salad dressings. Avocado. Olive, canola, sesame, or safflower oils. The items listed above may not be a complete list of foods and beverages you can eat. Contact a dietitian for more information. What foods   should I avoid? Fruits Canned fruit in heavy syrup. Fruit in cream or butter sauce. Fried fruit. Vegetables Vegetables cooked in cheese, cream, or butter sauce. Fried vegetables. Grains White bread. White pasta. White rice. Cornbread. Bagels, pastries,  and croissants. Crackers and snack foods that contain trans fat and hydrogenated oils. Meats and other protein foods Fatty cuts of meat. Ribs, chicken wings, bacon, sausage, bologna, salami, chitterlings, fatback, hot dogs, bratwurst, and packaged lunch meats. Liver and organ meats. Whole eggs and egg yolks. Chicken and turkey with skin. Fried meat. Dairy Whole or 2% milk, cream, half-and-half, and cream cheese. Whole milk cheeses. Whole-fat or sweetened yogurt. Full-fat cheeses. Nondairy creamers and whipped toppings. Processed cheese, cheese spreads, and cheese curds. Fats and oils Butter, stick margarine, lard, shortening, ghee, or bacon fat. Coconut, palm kernel, and palm oils. Beverages Alcohol. Sugar-sweetened drinks such as sodas, lemonade, and fruit drinks. Sweets and desserts Corn syrup, sugars, honey, and molasses. Candy. Jam and jelly. Syrup. Sweetened cereals. Cookies, pies, cakes, donuts, muffins, and ice cream. The items listed above may not be a complete list of foods and beverages you should avoid. Contact a dietitian for more information. Summary Choosing the right foods helps keep your fat and cholesterol at normal levels. This can keep you from getting certain diseases. At meals, fill one-half of your plate with vegetables, green salads, and fruits. Eat high fiber foods, like whole grains, beans, apples, pears, berries, carrots, peas, and barley. Limit added sugar, saturated fats, alcohol, and fried foods. This information is not intended to replace advice given to you by your health care provider. Make sure you discuss any questions you have with your health care provider. Document Revised: 04/12/2021 Document Reviewed: 04/12/2021 Elsevier Patient Education  2022 [employer]  

## 2022-03-01 LAB — HM DIABETES EYE EXAM

## 2022-03-03 IMAGING — MG DIGITAL SCREENING BILAT W/ TOMO W/ CAD
8 series · 8 of 24 positions shown · non-contrast
Comparison: Previous exam(s).

CLINICAL DATA: Screening.

EXAM:
DIGITAL SCREENING BILATERAL MAMMOGRAM WITH TOMO AND CAD

[R CC synth-2D]
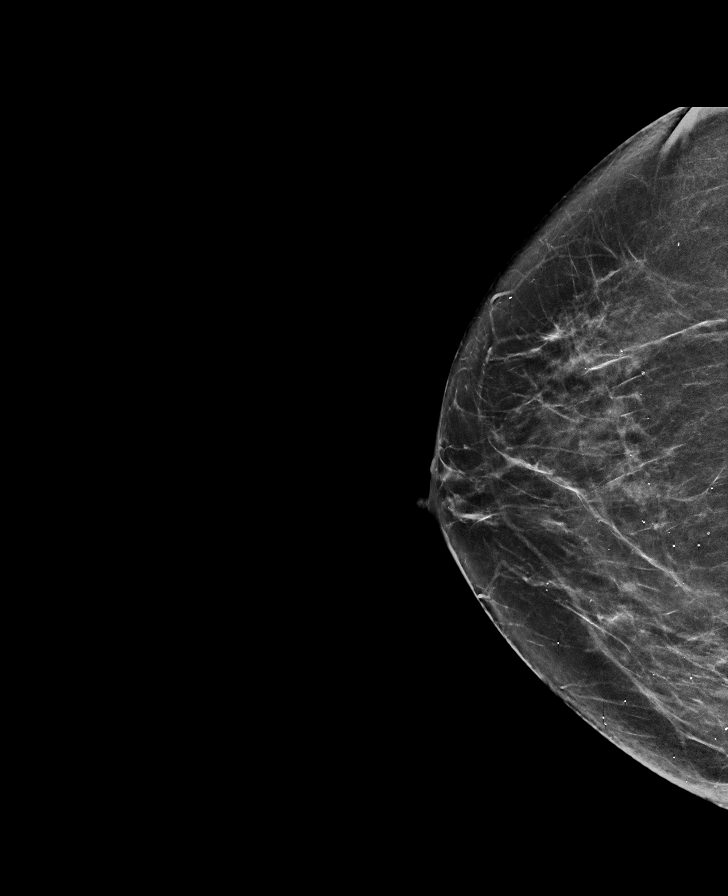

[L CC synth-2D]
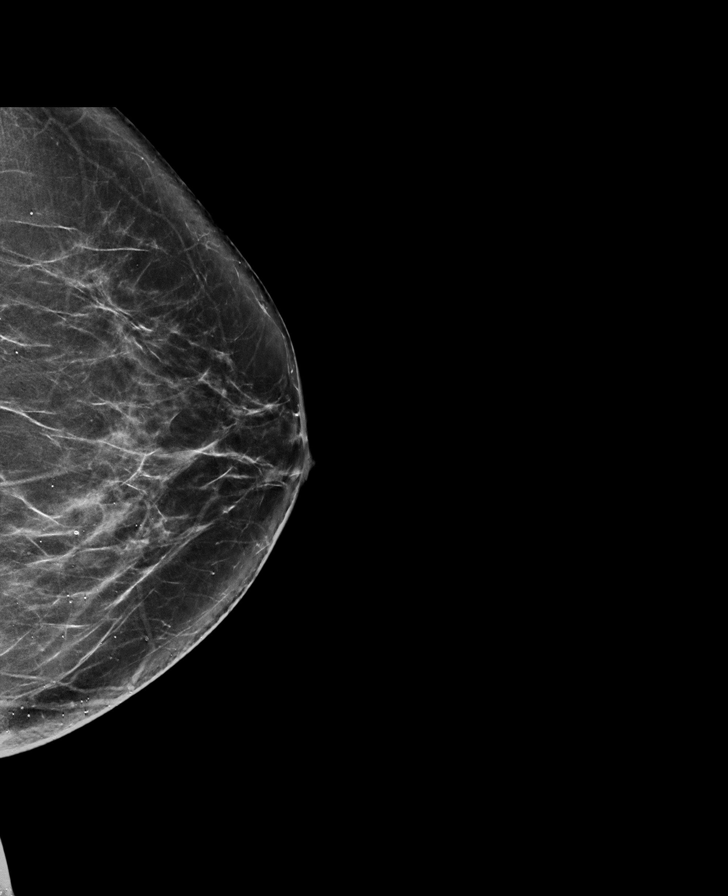

[L MLO synth-2D]
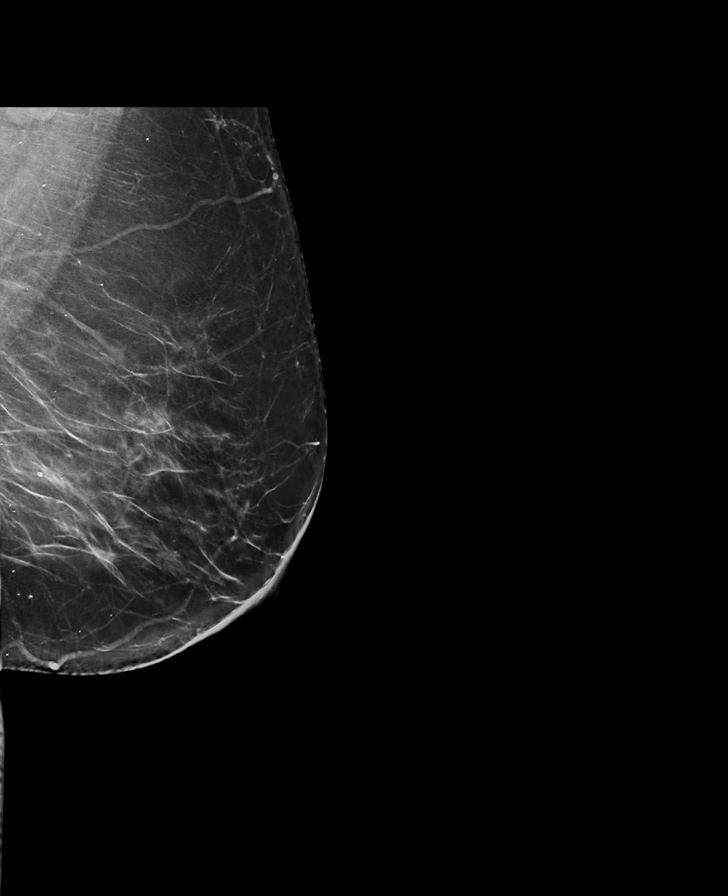

[R MLO synth-2D]
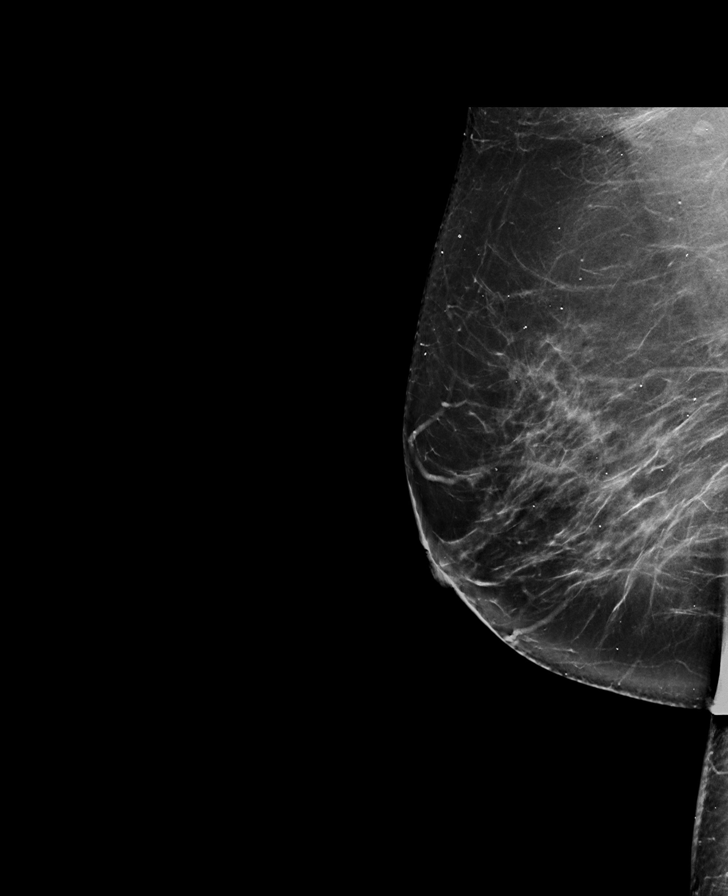

[L CC tomo · tomo slice 37/73.0]
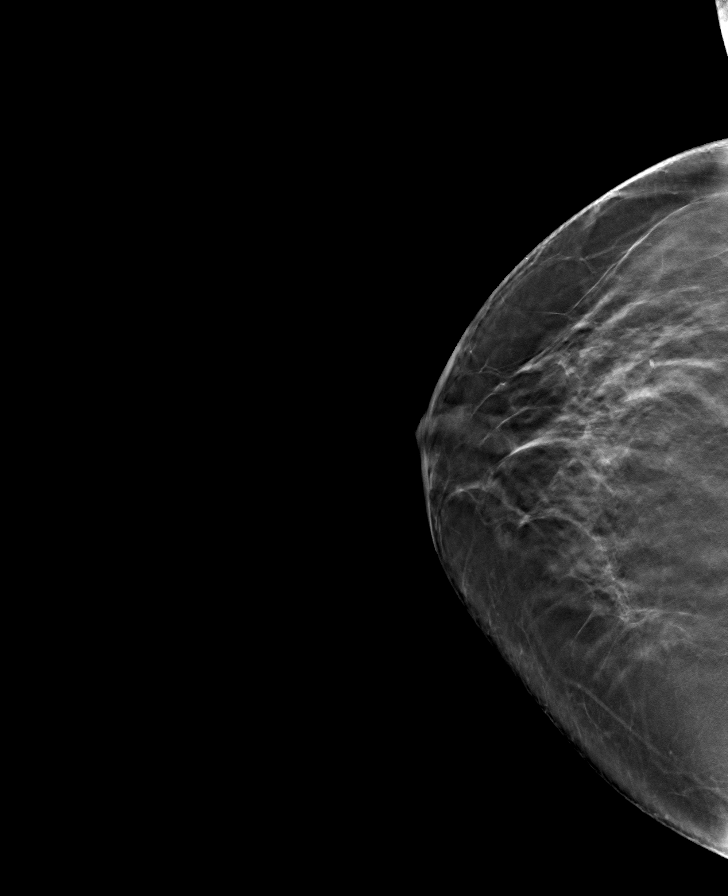

[R CC tomo · tomo slice 35/68.0]
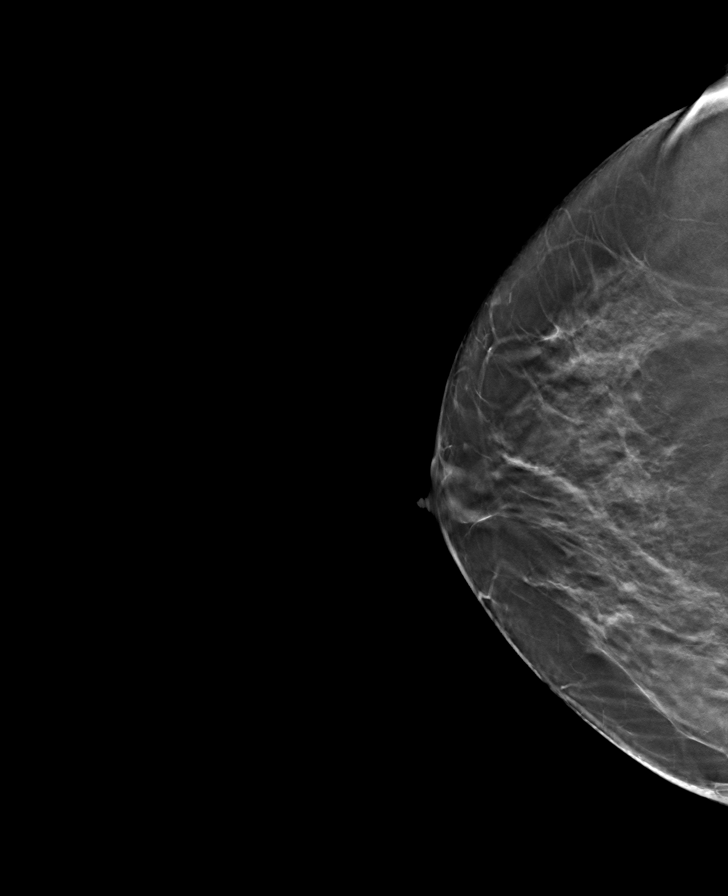

[R MLO tomo · tomo slice 43/85.0]
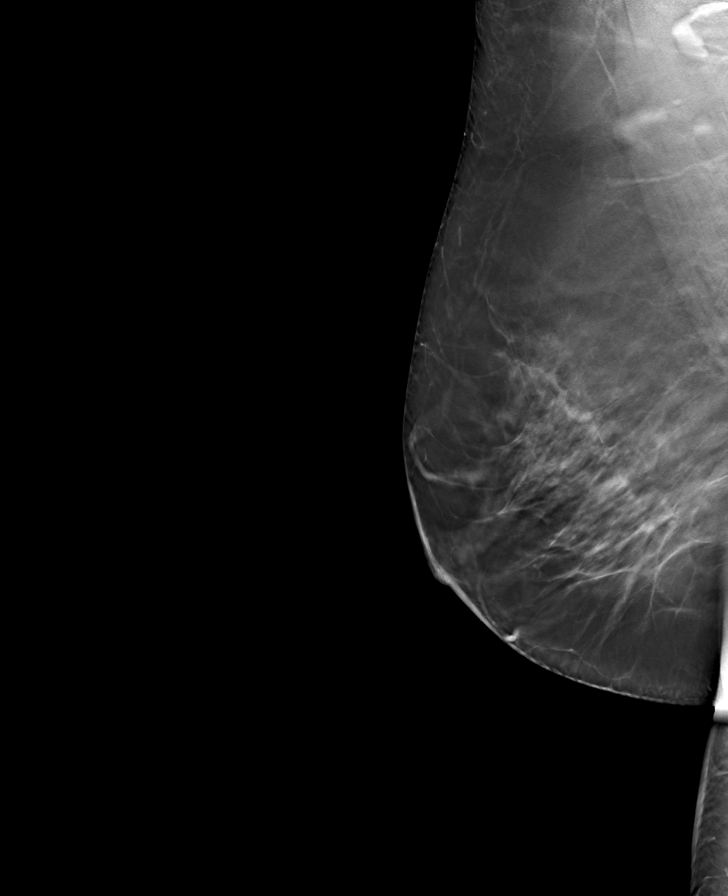

[L MLO tomo · tomo slice 43/85.0]
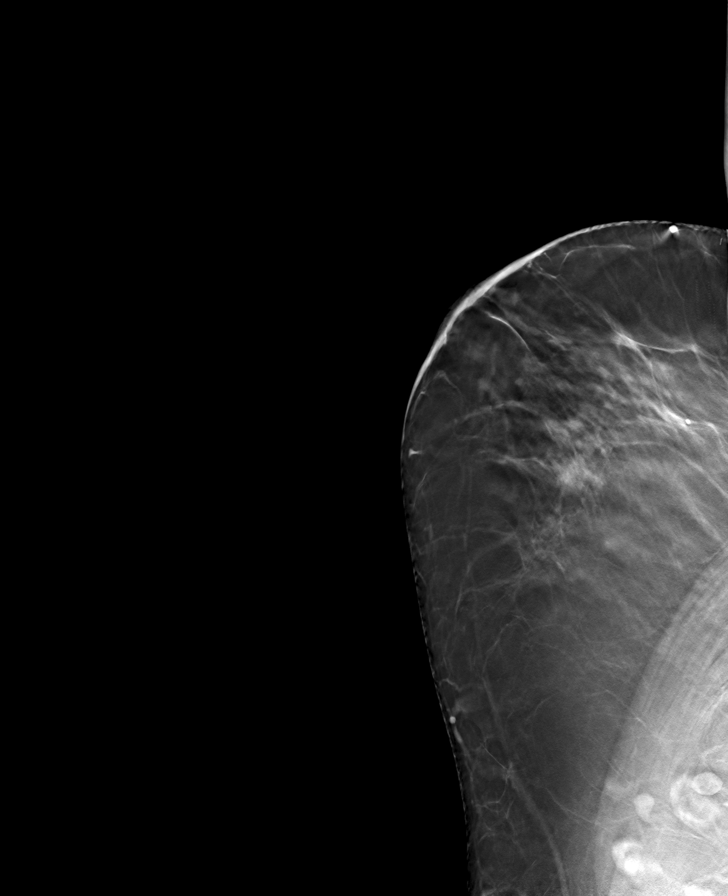

[8 of 24 positions shown; findings below may reference images not displayed]

ACR Breast Density Category b: There are scattered areas of
fibroglandular density.
FINDINGS: There are no findings suspicious for malignancy. Images were
processed with CAD.
IMPRESSION: No mammographic evidence of malignancy. A result letter of this
screening mammogram will be mailed directly to the patient.

RECOMMENDATION:
Screening mammogram in one year. (Code:[id number])

BI-RADS CATEGORY  1: Negative.

## 2022-03-04 ENCOUNTER — Telehealth: Payer: Self-pay | Admitting: Nurse Practitioner

## 2022-03-04 ENCOUNTER — Other Ambulatory Visit: Payer: Self-pay | Admitting: Nurse Practitioner

## 2022-03-04 DIAGNOSIS — I10 Essential (primary) hypertension: Secondary | ICD-10-CM

## 2022-03-04 MED ORDER — AMLODIPINE BESYLATE 10 MG PO TABS: 10.0000 mg | ORAL_TABLET | Freq: Every day | ORAL | 1 refills | Status: DC

## 2022-03-04 NOTE — Telephone Encounter (Signed)
Patient is aware 

## 2022-03-04 NOTE — Telephone Encounter (Signed)
Sent new prescription for amlodipine - 90 days with 1 refill to [facility]

## 2022-03-04 NOTE — Telephone Encounter (Signed)
Patient is requesting refill of amlodipine and asking for a 90 day supply for insurance purposes. Please advise.  ?

## 2022-03-04 NOTE — Progress Notes (Signed)
Sent new prescription for amlodipine - 90 days with 1 refill to [facility] ?

## 2022-03-13 ENCOUNTER — Ambulatory Visit (INDEPENDENT_AMBULATORY_CARE_PROVIDER_SITE_OTHER): Payer: BC Managed Care – PPO | Admitting: Nurse Practitioner

## 2022-03-13 VITALS — BP 120/71 | HR 95 | Temp 98.4°F | Ht 65.0 in | Wt 222.0 lb

## 2022-03-13 DIAGNOSIS — Z23 Encounter for immunization: Secondary | ICD-10-CM | POA: Diagnosis not present

## 2022-03-13 NOTE — Progress Notes (Signed)
Pt is her for her second Shingles vaccination  ?No issue or concern pt tolerated the vaccine well. ?

## 2022-03-16 DIAGNOSIS — G4733 Obstructive sleep apnea (adult) (pediatric): Secondary | ICD-10-CM | POA: Diagnosis not present

## 2022-03-24 DIAGNOSIS — G4733 Obstructive sleep apnea (adult) (pediatric): Secondary | ICD-10-CM | POA: Diagnosis not present

## 2022-03-27 ENCOUNTER — Encounter: Payer: Self-pay | Admitting: Pulmonary Disease

## 2022-03-27 ENCOUNTER — Ambulatory Visit (INDEPENDENT_AMBULATORY_CARE_PROVIDER_SITE_OTHER): Payer: BC Managed Care – PPO | Admitting: Pulmonary Disease

## 2022-03-27 VITALS — BP 122/72 | HR 88 | Temp 98.2°F | Ht 65.0 in | Wt 220.0 lb

## 2022-03-27 DIAGNOSIS — Z9989 Dependence on other enabling machines and devices: Secondary | ICD-10-CM | POA: Diagnosis not present

## 2022-03-27 DIAGNOSIS — G4733 Obstructive sleep apnea (adult) (pediatric): Secondary | ICD-10-CM | POA: Diagnosis not present

## 2022-03-27 NOTE — Patient Instructions (Signed)
I will see you back in a year ? ?Continue using your CPAP on a regular basis ? ?Call us with any significant concerns ? ?The download from the machine shows that the CPAP is working well ?

## 2022-03-27 NOTE — Progress Notes (Signed)
? ?      ?Melanie Steele    [id number]    25-Oct-1961 ? ?Primary Care Physician:Boscia, Greer Ee, NP ? ?Referring Physician: Ronnell Freshwater, NP ?[address] ?[place],  [place] [address] ? ?Chief complaint:   ?Follow-up for obstructive sleep apnea ? ?HPI: ? ?Continues to use CPAP on a nightly basis ? ?Not having any significant problems with CPAP use ?Continues to benefit from use ? ?Moved to [place] in 2019 ? ?Had a recent sleep study in 2020 showing moderately severe obstructive sleep apnea ?did receive a new machine ?On auto CPAP 5-15- ? ?She was recently seen by Dr. Melvyn Novas for possible pulmonary hypertension, had a recent cardiac CT for palpitations ? ?Denies any significant shortness of breath ?She does have a dog ?Does not smoke ?Drinks 1 caffeinated beverage a day ? ?Sleep hours is after midnight a little bit later ?Falls asleep about 30 minutes ?Usually about 2 awakenings ?Final wake up time about 6 AM ? ?her dad did snore ? ?Outpatient Encounter Medications as of 03/27/2022  ?Medication Sig  ? amLODipine (NORVASC) 10 MG tablet Take 1 tablet (10 mg total) by mouth daily.  ? Continuous Blood Gluc Sensor (FREESTYLE LIBRE 2 SENSOR) MISC 1 Device by Does not apply route every 14 (fourteen) days.  ? Dulaglutide (TRULICITY) 1.5 AS/3.4HD SOPN Inject 1.5 mg into the skin once a week.  ? empagliflozin (JARDIANCE) 25 MG TABS tablet Take 1 tablet (25 mg total) by mouth daily.  ? EPINEPHrine 0.3 mg/0.3 mL IJ SOAJ injection Inject 0.3 mg IM as needed for allergic reaction  ? fexofenadine (ALLEGRA) 180 MG tablet Take 1 tablet by mouth daily.  ? fluticasone (FLONASE) 50 MCG/ACT nasal spray Place into both nostrils 2 (two) times daily.  ? gabapentin (NEURONTIN) 100 MG capsule Take 1 capsule (100 mg total) by mouth 3 (three) times daily as needed.  ? glipiZIDE (GLUCOTROL) 5 MG tablet Take 1 tablet (5 mg total) by mouth 2 (two) times daily before a meal.  ? glucose blood (ONETOUCH VERIO) test strip Daily  ?  hydrochlorothiazide (HYDRODIURIL) 25 MG tablet Take 1 tablet (25 mg total) by mouth daily.  ? levothyroxine (SYNTHROID) 150 MCG tablet Take 1 tablet (150 mcg total) by mouth daily before breakfast. Take 2 tablets on Sundays, and 1 tablet the rest of the week  ? metoprolol succinate (TOPROL-XL) 50 MG 24 hr tablet TAKE 1 TABLET(50 MG) BY MOUTH DAILY  ? Multiple Vitamin (MULTIVITAMIN) tablet Take 1 tablet by mouth daily.  ? potassium chloride SA (KLOR-CON M) 20 MEQ tablet Take 1 tablet (20 mEq total) by mouth daily.  ? rosuvastatin (CRESTOR) 10 MG tablet Take 1 tablet (10 mg total) by mouth daily.  ? ?No facility-administered encounter medications on file as of 03/27/2022.  ? ? ?Allergies as of 03/27/2022 - Review Complete 03/27/2022  ?Allergen Reaction Noted  ? Lisinopril Anaphylaxis 02/25/2013  ? Other Anaphylaxis 02/23/2019  ? Linagliptin Other (See Comments) 04/14/2013  ? Metformin and related Diarrhea 06/29/2014  ? ? ?Past Medical History:  ?Diagnosis Date  ? CPAP (continuous positive airway pressure) dependence   ? Hyperlipidemia   ? Hypertension   ? Sleep apnea   ? Type 2 diabetes mellitus ([place])   ? Vitamin D deficiency   ? ? ?Past Surgical History:  ?Procedure Laterality Date  ? ABDOMINAL HYSTERECTOMY    ? TONSILLECTOMY    ? TOOTH EXTRACTION    ? TRIGGER FINGER RELEASE    ? ? ?  Family History  ?Problem Relation Age of Onset  ? Diabetes Mother   ? Hypertension Mother   ? Stroke Father   ? Hypertension Sister   ? ? ?Social History  ? ?Socioeconomic History  ? Marital status: Widowed  ?  Spouse name: Not on file  ? Number of children: Not on file  ? Years of education: Not on file  ? Highest education level: Not on file  ?Occupational History  ? Not on file  ?Tobacco Use  ? Smoking status: Never  ? Smokeless tobacco: Never  ?Vaping Use  ? Vaping Use: Never used  ?Substance and Sexual Activity  ? Alcohol use: Yes  ?  Comment: occ  ? Drug use: Never  ? Sexual activity: Not Currently  ?Other Topics Concern  ? Not on  file  ?Social History Narrative  ? Not on file  ? ?Social Determinants of Health  ? ?Financial Resource Strain: Not on file  ?Food Insecurity: Not on file  ?Transportation Needs: Not on file  ?Physical Activity: Not on file  ?Stress: Not on file  ?Social Connections: Not on file  ?Intimate Partner Violence: Not on file  ? ? ?Review of Systems  ?Respiratory:  Positive for apnea. Negative for shortness of breath.   ?Cardiovascular:  Negative for chest pain.  ?Psychiatric/Behavioral:  Positive for sleep disturbance.   ? ?Vitals:  ? 03/27/22 1634  ?BP: 122/72  ?Pulse: 88  ?Temp: 98.2 ?F (36.8 ?C)  ?SpO2: 98%  ? ? ? ?Physical Exam ?Constitutional:   ?   Appearance: She is obese.  ?HENT:  ?   Head: Normocephalic.  ?   Nose: Nose normal. No congestion.  ?   Mouth/Throat:  ?   Mouth: Mucous membranes are moist.  ?Cardiovascular:  ?   Rate and Rhythm: Normal rate and regular rhythm.  ?   Heart sounds: No murmur heard. ?  No friction rub.  ?Pulmonary:  ?   Effort: No respiratory distress.  ?   Breath sounds: No stridor. No wheezing or rhonchi.  ?Musculoskeletal:  ?   Cervical back: No rigidity or tenderness.  ?Neurological:  ?   Mental Status: She is alert.  ?Psychiatric:     ?   Mood and Affect: Mood normal.  ? ? ? ?Data Reviewed: ?Her most recent study 06/01/2019 showing moderately severe obstructive sleep apnea ?Download from the machine shows excellent compliance ?AutoSet 5-15 ?Some mask leak with a residual AHI of 2.8 ? ?Assessment:  ?Severe obstructive sleep apnea ?-Compliant with CPAP use ?-Improvement in symptoms with CPAP use ? ?Sleeping for about 5 hours ?-Encouraged to get at least 6 to 8 hours of sleep ? ?Regular exercises encouraged ? ?She is using CPAP nightly and benefiting from it ? ?Mask issues being addressed ? ?Plan/Recommendations: ?I will see her back in a year from now ? ?Continue CPAP use ? ?Encouraged to call with any significant concerns ? ? ?Sherrilyn Rist MD ?[facility] ?03/27/2022, 4:40 PM ? ?CC: Ronnell Freshwater, NP ? ? ?

## 2022-04-03 DIAGNOSIS — Z1231 Encounter for screening mammogram for malignant neoplasm of breast: Secondary | ICD-10-CM | POA: Diagnosis not present

## 2022-04-03 DIAGNOSIS — G4733 Obstructive sleep apnea (adult) (pediatric): Secondary | ICD-10-CM | POA: Diagnosis not present

## 2022-04-10 ENCOUNTER — Ambulatory Visit: Payer: BC Managed Care – PPO | Admitting: Nurse Practitioner

## 2022-04-15 DIAGNOSIS — G4733 Obstructive sleep apnea (adult) (pediatric): Secondary | ICD-10-CM | POA: Diagnosis not present

## 2022-04-16 DIAGNOSIS — Z01419 Encounter for gynecological examination (general) (routine) without abnormal findings: Secondary | ICD-10-CM | POA: Diagnosis not present

## 2022-04-23 DIAGNOSIS — G4733 Obstructive sleep apnea (adult) (pediatric): Secondary | ICD-10-CM | POA: Diagnosis not present

## 2022-05-16 DIAGNOSIS — G4733 Obstructive sleep apnea (adult) (pediatric): Secondary | ICD-10-CM | POA: Diagnosis not present

## 2022-05-24 DIAGNOSIS — G4733 Obstructive sleep apnea (adult) (pediatric): Secondary | ICD-10-CM | POA: Diagnosis not present

## 2022-06-04 ENCOUNTER — Ambulatory Visit: Payer: BC Managed Care – PPO | Admitting: Internal Medicine

## 2022-06-15 DIAGNOSIS — G4733 Obstructive sleep apnea (adult) (pediatric): Secondary | ICD-10-CM | POA: Diagnosis not present

## 2022-06-29 NOTE — Progress Notes (Unsigned)
Established patient visit   Patient: Melanie Steele   DOB: [birth year]   61 y.o. Female  MRN: [id number] Visit Date: 06/30/2022   No chief complaint on file.  Subjective    HPI  Follow up  -hypertension  --sees cardiology  -histolry of type 2 diabetes and hypothyroid.  --sees endocrinologist --?eye exam    Medications: Outpatient Medications Prior to Visit  Medication Sig   amLODipine (NORVASC) 10 MG tablet Take 1 tablet (10 mg total) by mouth daily.   Continuous Blood Gluc Sensor (FREESTYLE LIBRE 2 SENSOR) MISC 1 Device by Does not apply route every 14 (fourteen) days.   Dulaglutide (TRULICITY) 1.5 EP/3.2RJ SOPN Inject 1.5 mg into the skin once a week.   empagliflozin (JARDIANCE) 25 MG TABS tablet Take 1 tablet (25 mg total) by mouth daily.   EPINEPHrine 0.3 mg/0.3 mL IJ SOAJ injection Inject 0.3 mg IM as needed for allergic reaction   fexofenadine (ALLEGRA) 180 MG tablet Take 1 tablet by mouth daily.   fluticasone (FLONASE) 50 MCG/ACT nasal spray Place into both nostrils 2 (two) times daily.   gabapentin (NEURONTIN) 100 MG capsule Take 1 capsule (100 mg total) by mouth 3 (three) times daily as needed.   glipiZIDE (GLUCOTROL) 5 MG tablet Take 1 tablet (5 mg total) by mouth 2 (two) times daily before a meal.   glucose blood (ONETOUCH VERIO) test strip Daily   hydrochlorothiazide (HYDRODIURIL) 25 MG tablet Take 1 tablet (25 mg total) by mouth daily.   levothyroxine (SYNTHROID) 150 MCG tablet Take 1 tablet (150 mcg total) by mouth daily before breakfast. Take 2 tablets on Sundays, and 1 tablet the rest of the week   metoprolol succinate (TOPROL-XL) 50 MG 24 hr tablet TAKE 1 TABLET(50 MG) BY MOUTH DAILY   Multiple Vitamin (MULTIVITAMIN) tablet Take 1 tablet by mouth daily.   potassium chloride SA (KLOR-CON M) 20 MEQ tablet Take 1 tablet (20 mEq total) by mouth daily.   rosuvastatin (CRESTOR) 10 MG tablet Take 1 tablet (10 mg total) by mouth daily.   No facility-administered  medications prior to visit.    Review of Systems  Last CBC Lab Results  Component Value Date   WBC 8.8 04/16/2020   HGB 15.1 04/16/2020   HCT 47.6 (H) 04/16/2020   MCV 98 (H) 04/16/2020   MCH 30.9 04/16/2020   RDW 12.7 04/16/2020   PLT 281 18/84/1660   Last metabolic panel Lab Results  Component Value Date   GLUCOSE 155 (H) 02/04/2022   NA 142 02/04/2022   K 4.0 02/04/2022   CL 104 02/04/2022   CO2 28 02/04/2022   BUN 10 02/04/2022   CREATININE 0.54 02/04/2022   EGFR 103 11/04/2021   CALCIUM 9.6 02/04/2022   PROT 7.3 02/04/2022   ALBUMIN 4.4 02/04/2022   LABGLOB 3.2 04/16/2020   AGRATIO 1.5 04/16/2020   BILITOT 0.9 02/04/2022   ALKPHOS 53 02/04/2022   AST 14 02/04/2022   ALT 15 02/04/2022   Last lipids Lab Results  Component Value Date   CHOL 187 02/04/2022   HDL 47.10 02/04/2022   LDLCALC 103 (H) 02/04/2022   TRIG 181.0 (H) 02/04/2022   CHOLHDL 4 02/04/2022   Last hemoglobin A1c Lab Results  Component Value Date   HGBA1C 9.4 (A) 02/04/2022   Last thyroid functions Lab Results  Component Value Date   TSH 2.06 02/04/2022   T4TOTAL 5.6 04/16/2020       Objective    There were no vitals taken for  this visit. BP Readings from Last 3 Encounters:  03/27/22 122/72  03/13/22 120/71  02/24/22 104/66    Wt Readings from Last 3 Encounters:  03/27/22 220 lb (99.8 kg)  03/13/22 222 lb (100.7 kg)  02/24/22 222 lb 14.4 oz (101.1 kg)    Physical Exam  ***  No results found for any visits on 06/30/22.  Assessment & Plan     Problem List Items Addressed This Visit   None    No follow-ups on file.         Ronnell Freshwater, NP  [facility] [phone] (phone) [phone] (fax)  [place]

## 2022-06-30 ENCOUNTER — Ambulatory Visit (INDEPENDENT_AMBULATORY_CARE_PROVIDER_SITE_OTHER): Payer: BC Managed Care – PPO | Admitting: Nurse Practitioner

## 2022-06-30 ENCOUNTER — Encounter: Payer: Self-pay | Admitting: Nurse Practitioner

## 2022-06-30 VITALS — BP 113/68 | HR 86 | Ht 64.96 in | Wt 220.8 lb

## 2022-06-30 DIAGNOSIS — E1165 Type 2 diabetes mellitus with hyperglycemia: Secondary | ICD-10-CM | POA: Diagnosis not present

## 2022-06-30 DIAGNOSIS — I10 Essential (primary) hypertension: Secondary | ICD-10-CM | POA: Diagnosis not present

## 2022-06-30 DIAGNOSIS — Z6836 Body mass index (BMI) 36.0-36.9, adult: Secondary | ICD-10-CM

## 2022-06-30 DIAGNOSIS — R002 Palpitations: Secondary | ICD-10-CM | POA: Diagnosis not present

## 2022-06-30 DIAGNOSIS — Z1211 Encounter for screening for malignant neoplasm of colon: Secondary | ICD-10-CM

## 2022-06-30 NOTE — Addendum Note (Signed)
Addended by: Vivia Birmingham on: 06/30/2022 10:23 AM   Modules accepted: Orders

## 2022-07-02 ENCOUNTER — Ambulatory Visit: Payer: BC Managed Care – PPO | Admitting: Internal Medicine

## 2022-07-16 ENCOUNTER — Ambulatory Visit: Payer: BC Managed Care – PPO | Admitting: Podiatry

## 2022-07-16 ENCOUNTER — Encounter: Payer: Self-pay | Admitting: Podiatry

## 2022-07-16 DIAGNOSIS — M79674 Pain in right toe(s): Secondary | ICD-10-CM | POA: Diagnosis not present

## 2022-07-16 DIAGNOSIS — B351 Tinea unguium: Secondary | ICD-10-CM

## 2022-07-16 DIAGNOSIS — M79675 Pain in left toe(s): Secondary | ICD-10-CM | POA: Diagnosis not present

## 2022-07-16 DIAGNOSIS — E119 Type 2 diabetes mellitus without complications: Secondary | ICD-10-CM | POA: Diagnosis not present

## 2022-07-16 DIAGNOSIS — G4733 Obstructive sleep apnea (adult) (pediatric): Secondary | ICD-10-CM | POA: Diagnosis not present

## 2022-07-16 NOTE — Progress Notes (Signed)
  Subjective:  Patient ID: Melanie Steele, female    DOB: 07-17-1961,   MRN: [id number]  No chief complaint on file.   61 y.o. female presents for concern of thickened elongated and painful nails that are difficult to trim. Requesting to have them trimmed today. Relates burning and tingling in their feet. Patient is diabetic and last A1c was  Lab Results  Component Value Date   HGBA1C 9.4 (A) 02/04/2022   .   PCP:  Ronnell Freshwater, NP    . Denies any other pedal complaints. Denies n/v/f/c.   Past Medical History:  Diagnosis Date   CPAP (continuous positive airway pressure) dependence    Hyperlipidemia    Hypertension    Sleep apnea    Type 2 diabetes mellitus (HCC)    Vitamin D deficiency     Objective:  Physical Exam: Vascular: DP/PT pulses 2/4 bilateral. CFT <3 seconds. Absent hair growth on digits. Edema noted to bilateral lower extremities. Xerosis noted bilaterally.  Skin. No lacerations or abrasions bilateral feet. Nails 1-5 bilateral  are thickened discolored and elongated with subungual debris.  Musculoskeletal: MMT 5/5 bilateral lower extremities in DF, PF, Inversion and Eversion. Deceased ROM in DF of ankle joint.  Neurological: Sensation intact to light touch. Protective sensation diminished bilateral.    Assessment:   1. Type 2 diabetes mellitus without complication, without long-term current use of insulin (HCC)   2. Pain due to onychomycosis of toenails of both feet      Plan:  Patient was evaluated and treated and all questions answered. -Discussed and educated patient on diabetic foot care, especially with  regards to the vascular, neurological and musculoskeletal systems.  -Stressed the importance of good glycemic control and the detriment of not  controlling glucose levels in relation to the foot. -Discussed supportive shoes at all times and checking feet regularly.  -Mechanically debrided all nails 1-5 bilateral using sterile nail nipper and filed  with dremel without incident  -Answered all patient questions -Patient to return  in 3 months for at risk foot care -Patient advised to call the office if any problems or questions arise in the meantime.   Lorenda Peck, DPM

## 2022-07-17 ENCOUNTER — Ambulatory Visit: Payer: BC Managed Care – PPO | Admitting: Nurse Practitioner

## 2022-08-04 NOTE — Progress Notes (Signed)
Patient's diabetic eye exam

## 2022-08-16 DIAGNOSIS — G4733 Obstructive sleep apnea (adult) (pediatric): Secondary | ICD-10-CM | POA: Diagnosis not present

## 2022-08-27 ENCOUNTER — Other Ambulatory Visit: Payer: Self-pay | Admitting: Nurse Practitioner

## 2022-08-27 DIAGNOSIS — R739 Hyperglycemia, unspecified: Secondary | ICD-10-CM | POA: Diagnosis not present

## 2022-08-27 DIAGNOSIS — I10 Essential (primary) hypertension: Secondary | ICD-10-CM

## 2022-08-27 DIAGNOSIS — J0100 Acute maxillary sinusitis, unspecified: Secondary | ICD-10-CM | POA: Diagnosis not present

## 2022-09-15 DIAGNOSIS — G4733 Obstructive sleep apnea (adult) (pediatric): Secondary | ICD-10-CM | POA: Diagnosis not present

## 2022-09-23 ENCOUNTER — Other Ambulatory Visit: Payer: Self-pay | Admitting: Nurse Practitioner

## 2022-09-23 ENCOUNTER — Other Ambulatory Visit: Payer: Self-pay

## 2022-09-23 DIAGNOSIS — I10 Essential (primary) hypertension: Secondary | ICD-10-CM

## 2022-09-23 MED ORDER — POTASSIUM CHLORIDE CRYS ER 20 MEQ PO TBCR: 20.0000 meq | EXTENDED_RELEASE_TABLET | Freq: Every day | ORAL | 1 refills | Status: DC

## 2022-09-23 NOTE — Progress Notes (Signed)
Renewed potassium rx and sent to [facility]

## 2022-10-20 ENCOUNTER — Ambulatory Visit: Payer: BC Managed Care – PPO | Admitting: Podiatry

## 2022-10-20 ENCOUNTER — Encounter: Payer: Self-pay | Admitting: Podiatry

## 2022-10-20 DIAGNOSIS — M79675 Pain in left toe(s): Secondary | ICD-10-CM

## 2022-10-20 DIAGNOSIS — B351 Tinea unguium: Secondary | ICD-10-CM

## 2022-10-20 DIAGNOSIS — M79674 Pain in right toe(s): Secondary | ICD-10-CM | POA: Diagnosis not present

## 2022-10-20 DIAGNOSIS — E119 Type 2 diabetes mellitus without complications: Secondary | ICD-10-CM | POA: Diagnosis not present

## 2022-10-20 NOTE — Progress Notes (Signed)
This patient returns to my office for at risk foot care.  This patient requires this care by a professional since this patient will be at risk due to having  diabetes.  This patient is unable to cut nails herself since the patient cannot reach her nails.These nails are painful walking and wearing shoes.  This patient presents for at risk foot care today.  General Appearance  Alert, conversant and in no acute stress.  Vascular  Dorsalis pedis and posterior tibial  pulses are palpable  bilaterally.  Capillary return is within normal limits  bilaterally. Temperature is within normal limits  bilaterally.  Neurologic  Senn-Weinstein monofilament wire test within normal limits  bilaterally. Muscle power within normal limits bilaterally.  Nails Thick disfigured discolored nails with subungual debris  from hallux to fifth toes bilaterally. No evidence of bacterial infection or drainage bilaterally.  Orthopedic  No limitations of motion  feet .  No crepitus or effusions noted.  No bony pathology or digital deformities noted.  Skin  normotropic skin with no porokeratosis noted bilaterally.  No signs of infections or ulcers noted.     Onychomycosis  Pain in right toes  Pain in left toes  Consent was obtained for treatment procedures.   Mechanical debridement of nails 1-5  bilaterally performed with a nail nipper.  Patient says she has gel nail polish so no dremel tool usage was performed.   Return office visit   3 months                  Told patient to return for periodic foot care and evaluation due to potential at risk complications.   Gardiner Barefoot DPM

## 2022-10-31 ENCOUNTER — Ambulatory Visit (INDEPENDENT_AMBULATORY_CARE_PROVIDER_SITE_OTHER): Payer: BC Managed Care – PPO | Admitting: Nurse Practitioner

## 2022-10-31 ENCOUNTER — Encounter: Payer: Self-pay | Admitting: Nurse Practitioner

## 2022-10-31 VITALS — BP 126/77 | HR 85 | Ht 64.96 in | Wt 221.1 lb

## 2022-10-31 DIAGNOSIS — Z13820 Encounter for screening for osteoporosis: Secondary | ICD-10-CM

## 2022-10-31 DIAGNOSIS — Z1231 Encounter for screening mammogram for malignant neoplasm of breast: Secondary | ICD-10-CM

## 2022-10-31 DIAGNOSIS — Z23 Encounter for immunization: Secondary | ICD-10-CM

## 2022-10-31 DIAGNOSIS — Z0001 Encounter for general adult medical examination with abnormal findings: Secondary | ICD-10-CM

## 2022-10-31 DIAGNOSIS — E1165 Type 2 diabetes mellitus with hyperglycemia: Secondary | ICD-10-CM

## 2022-10-31 DIAGNOSIS — Z1211 Encounter for screening for malignant neoplasm of colon: Secondary | ICD-10-CM

## 2022-10-31 DIAGNOSIS — E2839 Other primary ovarian failure: Secondary | ICD-10-CM

## 2022-10-31 DIAGNOSIS — I10 Essential (primary) hypertension: Secondary | ICD-10-CM

## 2022-10-31 NOTE — Progress Notes (Signed)
Complete physical exam   Patient: Melanie Steele   DOB: 1961/11/01   61 y.o. Female  MRN: [id number] Visit Date: 10/31/2022    Chief Complaint  Patient presents with   Annual Exam   Subjective    Kimoni Maevyn Riordan is a 61 y.o. female who presents today for a complete physical exam.  She reports consuming a  generally healty  diet. Home exercise routine includes walking 1 hrs per days. She generally feels well. She does have additional problems to discuss today.   HPI  Annual physical - -due to have mammogram -should have bone density  -sees endocrinology for diabetic care  -would like to have referral for screening colonoscopy  -blood pressure well managed.  -sees endocrinology for diabetic management     Past Medical History:  Diagnosis Date   CPAP (continuous positive airway pressure) dependence    Hyperlipidemia    Hypertension    Sleep apnea    Type 2 diabetes mellitus ([place])    Vitamin D deficiency    Past Surgical History:  Procedure Laterality Date   ABDOMINAL HYSTERECTOMY     TONSILLECTOMY     TOOTH EXTRACTION     TRIGGER FINGER RELEASE     Social History   Socioeconomic History   Marital status: Widowed    Spouse name: Not on file   Number of children: Not on file   Years of education: Not on file   Highest education level: Not on file  Occupational History   Not on file  Tobacco Use   Smoking status: Never   Smokeless tobacco: Never  Vaping Use   Vaping Use: Never used  Substance and Sexual Activity   Alcohol use: Yes    Comment: occ   Drug use: Never   Sexual activity: Not Currently  Other Topics Concern   Not on file  Social History Narrative   Not on file   Social Determinants of Health   Financial Resource Strain: Not on file  Food Insecurity: Not on file  Transportation Needs: Not on file  Physical Activity: Not on file  Stress: Not on file  Social Connections: Not on file  Intimate Partner Violence: Not on file   Family  Status  Relation Name Status   Mother  Deceased   Father  (Not Specified)   Sister  (Not Specified)   Daughter  Alive   Son  Alive   Family History  Problem Relation Age of Onset   Diabetes Mother    Hypertension Mother    Stroke Father    Hypertension Sister    Allergies  Allergen Reactions   Lisinopril Anaphylaxis   Other Anaphylaxis    Tree nuts, grass, seasonal allergies.    Linagliptin Other (See Comments)    Upset stomach   Metformin And Related Diarrhea    Patient Care Team: Ronnell Freshwater, NP as PCP - General (Family Medicine) Berniece Salines, DO as PCP - Cardiology (Cardiology)   Medications: Outpatient Medications Prior to Visit  Medication Sig   canagliflozin (INVOKANA) 300 MG TABS tablet Take 1 tablet by mouth daily.   Continuous Blood Gluc Sensor (FREESTYLE LIBRE 2 SENSOR) MISC 1 Device by Does not apply route every 14 (fourteen) days.   Dulaglutide (TRULICITY) 1.5 WU/9.8JX SOPN Inject 1.5 mg into the skin once a week.   empagliflozin (JARDIANCE) 25 MG TABS tablet Take 1 tablet (25 mg total) by mouth daily.   EPINEPHrine 0.3 mg/0.3 mL IJ SOAJ injection Inject 0.3  mg IM as needed for allergic reaction   fexofenadine (ALLEGRA) 180 MG tablet Take 1 tablet by mouth daily.   fluticasone (FLONASE) 50 MCG/ACT nasal spray Place into both nostrils 2 (two) times daily.   gabapentin (NEURONTIN) 100 MG capsule Take 1 capsule (100 mg total) by mouth 3 (three) times daily as needed.   glipiZIDE (GLUCOTROL) 5 MG tablet Take 1 tablet (5 mg total) by mouth 2 (two) times daily before a meal.   glucose blood (ONETOUCH VERIO) test strip Daily   hydrochlorothiazide (HYDRODIURIL) 25 MG tablet TAKE 1 TABLET(25 MG) BY MOUTH DAILY   levothyroxine (SYNTHROID) 150 MCG tablet Take 1 tablet (150 mcg total) by mouth daily before breakfast. Take 2 tablets on Sundays, and 1 tablet the rest of the week   metoprolol succinate (TOPROL-XL) 50 MG 24 hr tablet TAKE 1 TABLET(50 MG) BY MOUTH DAILY    Multiple Vitamin (MULTIVITAMIN) tablet Take 1 tablet by mouth daily.   potassium chloride SA (KLOR-CON M) 20 MEQ tablet Take 1 tablet (20 mEq total) by mouth daily.   rosuvastatin (CRESTOR) 10 MG tablet Take 1 tablet (10 mg total) by mouth daily.   thyroid (ARMOUR THYROID) 180 MG tablet Take 1 tablet by mouth daily.   [DISCONTINUED] amLODipine (NORVASC) 10 MG tablet Take 1 tablet (10 mg total) by mouth daily.   [DISCONTINUED] doxycycline (MONODOX) 100 MG capsule TAKE 2 CAPSULES BY MOUTH DAILY AS DIRECTED   [DISCONTINUED] liraglutide (VICTOZA) 18 MG/3ML SOPN ADM 1.8 MG [place] D   [DISCONTINUED] predniSONE (DELTASONE) 20 MG tablet    [DISCONTINUED] trimethoprim-polymyxin b (POLYTRIM) ophthalmic solution INSTILL 1 DROP INTO AFFECTED EYE(S) BY OPHTHALMIC ROUTE FOUR TIMES A DAY FOR ONE WEEK.   No facility-administered medications prior to visit.    Review of Systems  Constitutional:  Negative for activity change, appetite change, chills, fatigue and fever.  HENT:  Negative for congestion, postnasal drip, rhinorrhea, sinus pressure, sinus pain, sneezing and sore throat.   Eyes: Negative.   Respiratory:  Negative for cough, chest tightness, shortness of breath and wheezing.   Cardiovascular:  Negative for chest pain and palpitations.  Gastrointestinal:  Negative for abdominal pain, constipation, diarrhea, nausea and vomiting.  Endocrine: Negative for cold intolerance, heat intolerance, polydipsia and polyuria.       Patient seeing endocrinology  for diabetic management   Genitourinary:  Negative for dyspareunia, dysuria, flank pain, frequency and urgency.  Musculoskeletal:  Negative for arthralgias, back pain and myalgias.  Skin:  Negative for rash.  Allergic/Immunologic: Negative for environmental allergies.  Neurological:  Negative for dizziness, weakness and headaches.  Hematological:  Negative for adenopathy.  Psychiatric/Behavioral:  The patient is not nervous/anxious.     Last CBC Lab  Results  Component Value Date   WBC 8.8 04/16/2020   HGB 15.1 04/16/2020   HCT 47.6 (H) 04/16/2020   MCV 98 (H) 04/16/2020   MCH 30.9 04/16/2020   RDW 12.7 04/16/2020   PLT 281 55/37/4827   Last metabolic panel Lab Results  Component Value Date   GLUCOSE 155 (H) 02/04/2022   NA 142 02/04/2022   K 4.0 02/04/2022   CL 104 02/04/2022   CO2 28 02/04/2022   BUN 10 02/04/2022   CREATININE 0.54 02/04/2022   EGFR 103 11/04/2021   CALCIUM 9.6 02/04/2022   PROT 7.3 02/04/2022   ALBUMIN 4.4 02/04/2022   LABGLOB 3.2 04/16/2020   AGRATIO 1.5 04/16/2020   BILITOT 0.9 02/04/2022   ALKPHOS 53 02/04/2022   AST 14 02/04/2022  ALT 15 02/04/2022   Last lipids Lab Results  Component Value Date   CHOL 187 02/04/2022   HDL 47.10 02/04/2022   LDLCALC 103 (H) 02/04/2022   TRIG 181.0 (H) 02/04/2022   CHOLHDL 4 02/04/2022   Last hemoglobin A1c Lab Results  Component Value Date   HGBA1C 9.4 (A) 02/04/2022   Last thyroid functions Lab Results  Component Value Date   TSH 2.06 02/04/2022   T4TOTAL 5.6 04/16/2020        Objective     Today's Vitals   10/31/22 0900  BP: 126/77  Pulse: 85  SpO2: 96%  Weight: 221 lb 1.9 oz (100.3 kg)  Height: 5' 4.96" (1.65 m)   Body mass index is 36.84 kg/m.  BP Readings from Last 3 Encounters:  10/31/22 126/77  06/30/22 113/68  03/27/22 122/72    Wt Readings from Last 3 Encounters:  10/31/22 221 lb 1.9 oz (100.3 kg)  06/30/22 220 lb 12.8 oz (100.2 kg)  03/27/22 220 lb (99.8 kg)     Physical Exam Vitals and nursing note reviewed.  Constitutional:      Appearance: Normal appearance. She is well-developed.  HENT:     Head: Normocephalic and atraumatic.     Right Ear: Tympanic membrane, ear canal and external ear normal.     Left Ear: Tympanic membrane, ear canal and external ear normal.     Nose: Nose normal.     Mouth/Throat:     Mouth: Mucous membranes are moist.     Pharynx: Oropharynx is clear.  Eyes:     Extraocular  Movements: Extraocular movements intact.     Conjunctiva/sclera: Conjunctivae normal.     Pupils: Pupils are equal, round, and reactive to light.  Neck:     Vascular: No carotid bruit.  Cardiovascular:     Rate and Rhythm: Normal rate and regular rhythm.     Pulses: Normal pulses.     Heart sounds: Normal heart sounds.  Pulmonary:     Effort: Pulmonary effort is normal.     Breath sounds: Normal breath sounds.  Abdominal:     General: Bowel sounds are normal. There is no distension.     Palpations: Abdomen is soft. There is no mass.     Tenderness: There is no abdominal tenderness. There is no right CVA tenderness, left CVA tenderness, guarding or rebound.     Hernia: No hernia is present.  Musculoskeletal:        General: Normal range of motion.     Cervical back: Normal range of motion and neck supple.  Lymphadenopathy:     Cervical: No cervical adenopathy.  Skin:    General: Skin is warm and dry.     Capillary Refill: Capillary refill takes less than 2 seconds.  Neurological:     General: No focal deficit present.     Mental Status: She is alert and oriented to person, place, and time.  Psychiatric:        Mood and Affect: Mood normal.        Behavior: Behavior normal.        Thought Content: Thought content normal.        Judgment: Judgment normal.     Last depression screening scores   Row Labels 10/31/2022    9:03 AM 06/30/2022    8:39 AM 02/24/2022    8:51 AM  PHQ 2/9 Scores   Section Header. No data exists in this row.     PHQ - 2  Score   1 0 0  PHQ- 9 Score   _0 Last fall risk screening   Row Labels 10/28/2021    1:37 PM  Fall Risk    Section Header. No data exists in this row.   Falls in the past year?   0  Number falls in past yr:   0  Injury with Fall?   0  Follow up   Falls evaluation completed     Assessment & Plan    1. Encounter for general adult medical examination with abnormal findings Annual physical today   2. Essential  hypertension Stable blood pressure. Continue medications as prescribed. Cardiology visits as scheduled   3. Type 2 diabetes mellitus with hyperglycemia, without long-term current use of insulin ([place]) Continue regular visits with endocrinology as scheduled.   4. Ovarian failure Bone density test ordered today  - DG Bone Density; Future  5. Screening for osteoporosis Bone density test ordered today  - DG Bone Density; Future  6. Encounter for screening mammogram for malignant neoplasm of breast Screening mammogram ordered today - MM 3D SCREEN BREAST BILATERAL; Future  7. Screening for colon cancer .refer to GI for screening colonoscopy  - Ambulatory referral to Gastroenterology  8. Need for influenza vaccination Flu vaccine administered during today's visit.  - Flu Vaccine QUAD 6+ mos PF IM (Fluarix Quad PF)    Immunization History  Administered Date(s) Administered   Hepatitis B 08/22/2011, 11/17/2011, 03/05/2012   Hepatitis B, adult 11/17/2011, 03/05/2012   Influenza,inj,Quad PF,6+ Mos 10/14/2019, 10/28/2021, 10/31/2022   Influenza-Unspecified 10/24/2016, 10/28/2020   PFIZER(Purple Top)SARS-COV-2 Vaccination 03/08/2020, 04/02/2020, 10/14/2020   PPD Test 09/22/2014   Pneumococcal Polysaccharide-23 06/13/2011   Tdap 11/07/2016   Zoster Recombinat (Shingrix) 11/13/2021, 03/13/2022    Health Maintenance  Topic Date Due   Fecal DNA (Cologuard)  Never done   MAMMOGRAM  07/20/2022   HEMOGLOBIN A1C  08/04/2022   COVID-19 Vaccine (4 - 2023-24 season) 08/15/2022   Diabetic kidney evaluation - Urine ACR  10/01/2022   Diabetic kidney evaluation - GFR measurement  02/04/2023   OPHTHALMOLOGY EXAM  03/02/2023   FOOT EXAM  10/21/2023   DTaP/Tdap/Td (2 - Td or Tdap) 11/07/2026   INFLUENZA VACCINE  Completed   Hepatitis C Screening  Completed   HIV Screening  Completed   Zoster Vaccines- Shingrix  Completed   HPV VACCINES  Aged Out    Discussed health benefits of physical  activity, and encouraged her to engage in regular exercise appropriate for her age and condition.  Problem List Items Addressed This Visit       Cardiovascular and Mediastinum   Essential hypertension     Endocrine   Diabetes mellitus ([place])   Ovarian failure   Relevant Orders   DG Bone Density   Other Visit Diagnoses     Encounter for general adult medical examination with abnormal findings    -  Primary   Screening for osteoporosis       Relevant Orders   DG Bone Density   Encounter for screening mammogram for malignant neoplasm of breast       Relevant Orders   MM 3D SCREEN BREAST BILATERAL   Screening for colon cancer       Relevant Orders   Ambulatory referral to Gastroenterology   Need for influenza vaccination       Relevant Orders   Flu Vaccine QUAD 6+ mos PF IM (Fluarix Quad PF) (Completed)  Return in about 6 months (around 05/01/2023) for blood pressure, FBW a week prior to visit. does not need HgbA1c. sees endocrinology .        Ronnell Freshwater, NP  [facility] [phone] (phone) [phone] (fax)  [place]

## 2022-11-12 ENCOUNTER — Encounter: Payer: Self-pay | Admitting: Gastroenterology

## 2022-11-12 ENCOUNTER — Other Ambulatory Visit: Payer: Self-pay

## 2022-11-12 DIAGNOSIS — I10 Essential (primary) hypertension: Secondary | ICD-10-CM

## 2022-11-12 MED ORDER — AMLODIPINE BESYLATE 10 MG PO TABS: 10.0000 mg | ORAL_TABLET | Freq: Every day | ORAL | 1 refills | Status: DC

## 2022-11-14 ENCOUNTER — Ambulatory Visit: Payer: BC Managed Care – PPO | Admitting: Internal Medicine

## 2022-11-14 DIAGNOSIS — E1165 Type 2 diabetes mellitus with hyperglycemia: Secondary | ICD-10-CM

## 2022-11-14 NOTE — Progress Notes (Deleted)
Name: Melanie Steele  Age/ Sex: 61 y.o., female   MRN/ DOB: [id number], [birth year]     PCP: Ronnell Freshwater, NP   Reason for Endocrinology Evaluation: Type 2 Diabetes Mellitus  Initial Endocrine Consultative Visit: 08/21/2021    PATIENT IDENTIFIER: Melanie Steele is a 61 y.o. female with a past medical history of T2DM, OSA, HTN and Dyslipidemia. The patient has followed with Endocrinology clinic since 08/21/2020 for consultative assistance with management of her diabetes.  DIABETIC HISTORY:  Melanie Steele was diagnosed with DM many years ago. Metformin-  Tearing stomach   Her hemoglobin A1c has ranged from 7.9% in 2020, peaking at 9.8% in 2021.  On her initial visit to our clinic she had an A1c 9.8%, she was on Invokana and Trulicity, we started Glipizide      THYROID HISTORY: Has been diagnosed with hyperthyroidism in 1993 secondary to Graves' disease . She is S/P RAI ablation, followed by LT- 4 replacement. She was on levothyroxine initially and switched to armour a few years ago ( ~ 2019) . She has been having issues with adjusting her levels.  By 08/2021 we switched from Armour to Levothyroxine      Father with thyroid disease    SUBJECTIVE:   During the last visit (02/04/2022): A1c 9.3% . We changed Glipizide,continued Jardiance and  Trulicity    Today (09/19/2693): Melanie Steele is here for a follow up on diabetes and hypothyroidism.  She checks her blood sugars multiple times daily through CGM . The patient has  had hypoglycemic episodes since the last clinic visit, which she attributes to not eating any carbs one day.   Was seen by podiatry 10/20/2022  She is on a smaller dose of Trulicity due to shortage in supply of ozempic and higher doses of Trulicity  since 07/5461  Denies nausea , vomiting or  diarrhea  Daughter with CVA at age 73  She has been having issues obtaining the Trulicity due to Shortage      HOME ENDOCRINE  REGIMEN:  Glipizide 5 mg , BID   Jardiance 25 mg ,1 tablet in the morning Trulicity 1.5 mg weekly  Levothyroxine 150 mcg ,2 tablets on sundays and 1 tablet rest of the week- pt continues to take 1 tablet  Rosuvastatin 10 mg daily      Statin: yes ACE-I/ARB: no    CONTINUOUS GLUCOSE MONITORING RECORD INTERPRETATION    Dates of Recording: incorrect dates    2/21 /2023 6:39 am 125 8:46 am 208 9:20 am 177       DIABETIC COMPLICATIONS: Microvascular complications:   Denies: CKD, retinopathy, neuropathy  Last Eye Exam: Completed 09/14/2020  Macrovascular complications:   Denies: CAD, CVA, PVD   HISTORY:  Past Medical History:  Past Medical History:  Diagnosis Date   CPAP (continuous positive airway pressure) dependence    Hyperlipidemia    Hypertension    Sleep apnea    Type 2 diabetes mellitus ([place])    Vitamin D deficiency    Past Surgical History:  Past Surgical History:  Procedure Laterality Date   ABDOMINAL HYSTERECTOMY     TONSILLECTOMY     TOOTH EXTRACTION     TRIGGER FINGER RELEASE     Social History:  reports that she has never smoked. She has never used smokeless tobacco. She reports current alcohol use. She reports that she does not use drugs. Family History:  Family History  Problem Relation Age of Onset   Diabetes Mother  Hypertension Mother    Stroke Father    Hypertension Sister      HOME MEDICATIONS: Allergies as of 11/14/2022       Reactions   Lisinopril Anaphylaxis   Other Anaphylaxis   Tree nuts, grass, seasonal allergies.    Linagliptin Other (See Comments)   Upset stomach   Metformin And Related Diarrhea        Medication List        Accurate as of November 14, 2022  8:00 AM. If you have any questions, ask your nurse or doctor.          amLODipine 10 MG tablet Commonly known as: NORVASC Take 1 tablet (10 mg total) by mouth daily.   Armour Thyroid 180 MG tablet Generic drug: thyroid Take 1 tablet by mouth daily.   empagliflozin 25 MG  Tabs tablet Commonly known as: Jardiance Take 1 tablet (25 mg total) by mouth daily.   EPINEPHrine 0.3 mg/0.3 mL [place] injection Commonly known as: EPI-PEN Inject 0.3 mg IM as needed for allergic reaction   fexofenadine 180 MG tablet Commonly known as: ALLEGRA Take 1 tablet by mouth daily.   fluticasone 50 MCG/ACT nasal spray Commonly known as: FLONASE Place into both nostrils 2 (two) times daily.   FreeStyle Libre 2 Sensor Misc 1 Device by Does not apply route every 14 (fourteen) days.   gabapentin 100 MG capsule Commonly known as: NEURONTIN Take 1 capsule (100 mg total) by mouth 3 (three) times daily as needed.   glipiZIDE 5 MG tablet Commonly known as: GLUCOTROL Take 1 tablet (5 mg total) by mouth 2 (two) times daily before a meal.   hydrochlorothiazide 25 MG tablet Commonly known as: HYDRODIURIL TAKE 1 TABLET(25 MG) BY MOUTH DAILY   Invokana 300 MG Tabs tablet Generic drug: canagliflozin Take 1 tablet by mouth daily.   levothyroxine 150 MCG tablet Commonly known as: SYNTHROID Take 1 tablet (150 mcg total) by mouth daily before breakfast. Take 2 tablets on Sundays, and 1 tablet the rest of the week   metoprolol succinate 50 MG 24 hr tablet Commonly known as: TOPROL-XL TAKE 1 TABLET(50 MG) BY MOUTH DAILY   multivitamin tablet Take 1 tablet by mouth daily.   OneTouch Verio test strip Generic drug: glucose blood Daily   potassium chloride SA 20 MEQ tablet Commonly known as: KLOR-CON M Take 1 tablet (20 mEq total) by mouth daily.   rosuvastatin 10 MG tablet Commonly known as: Crestor Take 1 tablet (10 mg total) by mouth daily.   Trulicity 1.5 ZO/1.0RU Sopn Generic drug: Dulaglutide Inject 1.5 mg into the skin once a week.         OBJECTIVE:   Vital Signs: There were no vitals taken for this visit.  Wt Readings from Last 3 Encounters:  10/31/22 221 lb 1.9 oz (100.3 kg)  06/30/22 220 lb 12.8 oz (100.2 kg)  03/27/22 220 lb (99.8 kg)      Exam: General: Pt appears well and is in NAD  Lungs: Clear with good BS bilat with no rales, rhonchi, or wheezes  Heart: RRR with normal S1 and S2 and no gallops; no murmurs; no rub  Abdomen: Normoactive bowel sounds, soft, nontender, without masses or organomegaly palpable  Extremities: No pretibial edema.   Neuro: MS is good with appropriate affect, pt is alert and Ox3    DM foot exam: 05/28/2021   The skin of the feet is intact without sores or ulcerations. The pedal pulses are 2+ on right  and 2+ on left. The sensation is intact to a screening 5.07, 10 gram monofilament bilaterally    DATA REVIEWED:  Lab Results  Component Value Date   HGBA1C 9.4 (A) 02/04/2022   HGBA1C 9.3 (A) 10/01/2021   HGBA1C 9.4 (A) 05/28/2021    Latest Reference Range & Units 02/04/22 09:42  Sodium 135 - 145 mEq/L 142  Potassium 3.5 - 5.1 mEq/L 4.0  Chloride 96 - 112 mEq/L 104  CO2 19 - 32 mEq/L 28  Glucose 70 - 99 mg/dL 155 (H)  BUN 6 - 23 mg/dL 10  Creatinine 0.40 - 1.20 mg/dL 0.54  Calcium 8.4 - 10.5 mg/dL 9.6  Alkaline Phosphatase 39 - 117 U/L 53  Albumin 3.5 - 5.2 g/dL 4.4  AST 0 - 37 U/L 14  ALT 0 - 35 U/L 15  Total Protein 6.0 - 8.3 g/dL 7.3  Total Bilirubin 0.2 - 1.2 mg/dL 0.9  GFR >60.00 mL/min 100.31  Total CHOL/HDL Ratio  4  Cholesterol 0 - 200 mg/dL 187  HDL Cholesterol >39.00 mg/dL 47.10  LDL (calc) 0 - 99 mg/dL 103 (H)  NonHDL  139.63  Triglycerides 0.0 - 149.0 mg/dL 181.0 (H)  VLDL 0.0 - 40.0 mg/dL 36.2    Latest Reference Range & Units 02/04/22 09:42  TSH 0.35 - 5.50 uIU/mL 2.06     ASSESSMENT / PLAN / RECOMMENDATIONS:   1) Type 2 Diabetes Mellitus, Poorly controlled, Without complications - Most recent A1c of 9.3 %. Goal A1c < 7.0 %.   - A1c stable but continues to be above goal, despite escalating glycemic agents  - Will increase Glipizide as below  - Will switch trulicity to Ozempic as she is having a difficult time obtaining it   MEDICATIONS: -  Increase Glipizide 5 mg , 1 tablet before breakfast and 1 tablet before Supper  - Continue Jardiance 25 mg daily  - Will switch Trulicity 3 mg weekly to Ozempic 2 mg weekly     EDUCATION / INSTRUCTIONS: BG monitoring instructions: Patient is instructed to check her blood sugars 3 times a day, before meals  Call [facility] if: BG persistently < 70  I reviewed the Rule of 15 for the treatment of hypoglycemia in detail with the patient. Literature supplied.    2) Diabetic complications:  Eye: Does not have known diabetic retinopathy.  Neuro/ Feet: Does not have known diabetic peripheral neuropathy .  Renal: Patient does not have known baseline CKD. She   is not  on an ACEI/ARB at present.     3) Postablative Hypothyroidism:   - No local neck symptoms  - She is taking it appropriately   - TSH is normal    Medication:    Levothyroxine 150 mcg, 1 tablet daily    4) Dyslipidemia:   -LDL was above goal,and I increased rosuvastatin from 5 to 10 mg -Repeat lipid panel continues to show TG and LDL above goal -We will emphasize compliance and taking the proper dose    Medication Continue rosuvastatin 10 mg daily   F/U in 4 months    Signed electronically by: Mack Guise, MD  [facility]  [facility] [address] [place], [place] [address] Phone: [phone] FAX: [phone]   CC: Ronnell Freshwater, NP [place] [address] Phone: [phone]  Fax: [phone]  Return to Endocrinology clinic as below: Future Appointments  Date Time Provider [place]  11/14/2022 12:10 PM Euclide Granito,  Melanie Crazier, MD [facility] None  12/04/2022  8:30 AM [facility] PREVISIT RM 52 [facility]  12/23/2022  2:00 PM Tobb, Kardie, DO [facility] None  12/30/2022  8:30 AM Daryel November, MD [facility]  01/20/2023 11:15 AM Lorenda Peck, DPM [facility]   04/24/2023  8:30 AM [facility] None  04/30/2023 11:00 AM GI-[facility]G MM 2 GI-BCGMM GI-BREAST CE  04/30/2023 11:30 AM GI-[facility] DX DEXA 1 GI-BCGDG GI-BREAST CE  05/01/2023  8:50 AM Boscia, Greer Ee, NP [facility] None

## 2022-11-15 DIAGNOSIS — E2839 Other primary ovarian failure: Secondary | ICD-10-CM | POA: Insufficient documentation

## 2022-12-04 ENCOUNTER — Ambulatory Visit (AMBULATORY_SURGERY_CENTER): Payer: Self-pay | Admitting: *Deleted

## 2022-12-04 VITALS — Ht 65.0 in | Wt 220.0 lb

## 2022-12-04 DIAGNOSIS — Z1211 Encounter for screening for malignant neoplasm of colon: Secondary | ICD-10-CM

## 2022-12-04 MED ORDER — NA SULFATE-K SULFATE-MG SULF 17.5-3.13-1.6 GM/177ML PO SOLN: 1.0000 | Freq: Once | ORAL | 0 refills | Status: AC

## 2022-12-04 NOTE — Progress Notes (Signed)
Previsit done via telephone Instruction sent my chart and via verified mail No egg or soy allergy known to patient  No issues known to pt with past sedation with any surgeries or procedures Patient denies ever being told they had issues or difficulty with intubation  Has a small airway per MD who did tonsils No FH of Malignant Hyperthermia Pt is not on diet pills Pt is not on  home 02  Pt is not on blood thinners  Pt denies issues with constipation  Pt encouraged to use to use Singlecare or Goodrx to reduce cost  Patient's chart reviewed by Osvaldo Angst CNRA prior to previsit and patient appropriate for the [place].  Previsit completed and red dot placed by patient's name on their procedure day (on provider's schedule).  Marland Kitchen

## 2022-12-10 ENCOUNTER — Telehealth (INDEPENDENT_AMBULATORY_CARE_PROVIDER_SITE_OTHER): Payer: BC Managed Care – PPO | Admitting: Internal Medicine

## 2022-12-10 ENCOUNTER — Encounter: Payer: Self-pay | Admitting: Internal Medicine

## 2022-12-10 VITALS — Ht 65.0 in

## 2022-12-10 DIAGNOSIS — E039 Hypothyroidism, unspecified: Secondary | ICD-10-CM | POA: Diagnosis not present

## 2022-12-10 DIAGNOSIS — E1165 Type 2 diabetes mellitus with hyperglycemia: Secondary | ICD-10-CM

## 2022-12-10 DIAGNOSIS — E785 Hyperlipidemia, unspecified: Secondary | ICD-10-CM | POA: Diagnosis not present

## 2022-12-10 NOTE — Progress Notes (Signed)
Virtual Visit via Video Note  I connected with Melanie Steele on 12/10/22 at 8:30 am  by a video enabled telemedicine application and verified that I am speaking with the correct person using two identifiers.   I discussed the limitations of evaluation and management by telemedicine and the availability of in person appointments. The patient expressed understanding and agreed to proceed.   -Location of the patient : home -Location of the provider : Office -The names of all persons participating in the telemedicine service : Pt and myself       Name: Melanie Steele  Age/ Sex: 61 y.o., female   MRN/ DOB: [id number], Sep 09, 1961     PCP: Ronnell Freshwater, NP   Reason for Endocrinology Evaluation: Type 2 Diabetes Mellitus  Initial Endocrine Consultative Visit: 08/21/2021    PATIENT IDENTIFIER: Melanie Steele is a 61 y.o. female with a past medical history of T2DM, OSA, HTN and Dyslipidemia. The patient has followed with Endocrinology clinic since 08/21/2020 for consultative assistance with management of her diabetes.  DIABETIC HISTORY:  Melanie Steele was diagnosed with DM many years ago. Metformin-  Tearing stomach   Her hemoglobin A1c has ranged from 7.9% in 2020, peaking at 9.8% in 2021.  On her initial visit to our clinic she had an A1c 9.8%, she was on Invokana and Trulicity, we started Glipizide      THYROID HISTORY: Has been diagnosed with hyperthyroidism in 1993 secondary to Graves' disease . She is S/P RAI ablation, followed by LT- 4 replacement. She was on levothyroxine initially and switched to armour a few years ago ( ~ 2019) . She has been having issues with adjusting her levels.  By 08/2021 we switched from Armour to Levothyroxine      Father with thyroid disease    SUBJECTIVE:   During the last visit (02/04/2022): A1c 9.3% . We changed Glipizide,continued Jardiance and  Trulicity    Today (90/24/0973): Melanie Steele is here for a follow up on diabetes and  hypothyroidism.  She has not been checking glucose recently , [place] , kept knocking her sensor off and felt lazy abut it .  Was seen by podiatry 10/20/2022  She is on a smaller dose of Trulicity due to shortage in supply of ozempic and higher doses of Trulicity  since 04/3298  Denies nausea , vomiting or  diarrhea  Daughter with CVA at age 55   She is experiencing symptoms of upper respiratory infection to include nasal congestion, mild fever in the morning as well as voice hoarseness        HOME ENDOCRINE  REGIMEN:  Glipizide 5 mg , BID  Jardiance 25 mg ,1 tablet in the morning Trulicity 1.5 mg weekly  Levothyroxine 150 mcg , daily Rosuvastatin 10 mg daily      Statin: yes ACE-I/ARB: no    CONTINUOUS GLUCOSE MONITORING RECORD INTERPRETATION: n/a       DIABETIC COMPLICATIONS: Microvascular complications:   Denies: CKD, retinopathy, neuropathy  Last Eye Exam: Completed 09/14/2020  Macrovascular complications:   Denies: CAD, CVA, PVD   HISTORY:  Past Medical History:  Past Medical History:  Diagnosis Date   Allergy    Arthritis    CPAP (continuous positive airway pressure) dependence    Hyperlipidemia    Hypertension    Myocardial infarction [facility])    Sleep apnea    Thyroid disease    Type 2 diabetes mellitus ([place])    Vitamin D  deficiency    Past Surgical History:  Past Surgical History:  Procedure Laterality Date   ABDOMINAL HYSTERECTOMY     TONSILLECTOMY     TOOTH EXTRACTION     TRIGGER FINGER RELEASE     Social History:  reports that she has never smoked. She has never used smokeless tobacco. She reports current alcohol use. She reports that she does not use drugs. Family History:  Family History  Problem Relation Age of Onset   Colon polyps Mother    Diabetes Mother    Hypertension Mother    Stroke Father    Hypertension Sister    Colon cancer Neg Hx    Esophageal cancer Neg Hx    Rectal cancer Neg Hx    Stomach  cancer Neg Hx      HOME MEDICATIONS: Allergies as of 12/10/2022       Reactions   Lisinopril Anaphylaxis   Other Anaphylaxis   Tree nuts, grass, seasonal allergies.    Linagliptin Other (See Comments)   Upset stomach   Metformin And Related Diarrhea        Medication List        Accurate as of December 10, 2022  8:41 AM. If you have any questions, ask your nurse or doctor.          STOP taking these medications    Invokana 300 MG Tabs tablet Generic drug: canagliflozin Stopped by: Dorita Sciara, MD       TAKE these medications    amLODipine 10 MG tablet Commonly known as: NORVASC Take 1 tablet (10 mg total) by mouth daily.   empagliflozin 25 MG Tabs tablet Commonly known as: Jardiance Take 1 tablet (25 mg total) by mouth daily.   EPINEPHrine 0.3 mg/0.3 mL Soaj injection Commonly known as: EPI-PEN Inject 0.3 mg IM as needed for allergic reaction   fexofenadine 180 MG tablet Commonly known as: ALLEGRA Take 1 tablet by mouth daily.   fluticasone 50 MCG/ACT nasal spray Commonly known as: FLONASE Place into both nostrils 2 (two) times daily.   FreeStyle Libre 2 Sensor Misc 1 Device by Does not apply route every 14 (fourteen) days.   gabapentin 100 MG capsule Commonly known as: NEURONTIN Take 1 capsule (100 mg total) by mouth 3 (three) times daily as needed.   glipiZIDE 5 MG tablet Commonly known as: GLUCOTROL Take 1 tablet (5 mg total) by mouth 2 (two) times daily before a meal.   hydrochlorothiazide 25 MG tablet Commonly known as: HYDRODIURIL TAKE 1 TABLET(25 MG) BY MOUTH DAILY   levothyroxine 150 MCG tablet Commonly known as: SYNTHROID Take 1 tablet (150 mcg total) by mouth daily before breakfast. Take 2 tablets on Sundays, and 1 tablet the rest of the week   metoprolol succinate 50 MG 24 hr tablet Commonly known as: TOPROL-XL TAKE 1 TABLET(50 MG) BY MOUTH DAILY   multivitamin tablet Take 1 tablet by mouth daily.   OneTouch  Verio test strip Generic drug: glucose blood Daily   potassium chloride SA 20 MEQ tablet Commonly known as: KLOR-CON M Take 1 tablet (20 mEq total) by mouth daily.   rosuvastatin 10 MG tablet Commonly known as: Crestor Take 1 tablet (10 mg total) by mouth daily.   Trulicity 1.5 AC/1.6SA Sopn Generic drug: Dulaglutide Inject 1.5 mg into the skin once a week.         OBJECTIVE:   Vital Signs: Ht '5\' 5"'$  (1.651 m)   BMI 36.61 kg/m   Wt Readings  from Last 3 Encounters:  12/04/22 220 lb (99.8 kg)  10/31/22 221 lb 1.9 oz (100.3 kg)  06/30/22 220 lb 12.8 oz (100.2 kg)     DATA REVIEWED:  Lab Results  Component Value Date   HGBA1C 9.4 (A) 02/04/2022   HGBA1C 9.3 (A) 10/01/2021   HGBA1C 9.4 (A) 05/28/2021    Latest Reference Range & Units 02/04/22 09:42  Sodium 135 - 145 mEq/L 142  Potassium 3.5 - 5.1 mEq/L 4.0  Chloride 96 - 112 mEq/L 104  CO2 19 - 32 mEq/L 28  Glucose 70 - 99 mg/dL 155 (H)  BUN 6 - 23 mg/dL 10  Creatinine 0.40 - 1.20 mg/dL 0.54  Calcium 8.4 - 10.5 mg/dL 9.6  Alkaline Phosphatase 39 - 117 U/L 53  Albumin 3.5 - 5.2 g/dL 4.4  AST 0 - 37 U/L 14  ALT 0 - 35 U/L 15  Total Protein 6.0 - 8.3 g/dL 7.3  Total Bilirubin 0.2 - 1.2 mg/dL 0.9  GFR >60.00 mL/min 100.31  Total CHOL/HDL Ratio  4  Cholesterol 0 - 200 mg/dL 187  HDL Cholesterol >39.00 mg/dL 47.10  LDL (calc) 0 - 99 mg/dL 103 (H)  NonHDL  139.63  Triglycerides 0.0 - 149.0 mg/dL 181.0 (H)  VLDL 0.0 - 40.0 mg/dL 36.2    Latest Reference Range & Units 02/04/22 09:42  TSH 0.35 - 5.50 uIU/mL 2.06     ASSESSMENT / PLAN / RECOMMENDATIONS:   1) Type 2 Diabetes Mellitus, Poorly controlled, Without complications - Most recent A1c of 9.3 %. Goal A1c < 7.0 %.   -Unfortunately there is no glucose data to review during today's visit, the patient stopped using her CGM, I emphasized the importance of having glucose data and if she is unable to use CGM technology she should be using a regular glucose  meter to check her glucose, I recommend daily glucose checks but if she is unable to do that then checking it every other day or every third day is better than nothing -We started the virtual visit the patient was eating, when asked if she took her glipizide the patient indicated she did not take her medications yet, I emphasized the importance of taking glipizide before eating  -I have again encouraged the patient to check glucose daily and if her glucose is consistently >150 mg/dL, I would recommend increasing her glipizide -Intolerant to metformin and linagliptin     MEDICATIONS: -Continue glipizide 5 mg , 1 tablet before breakfast and 1 tablet before Supper  - Continue Jardiance 25 mg daily  -Continue Trulicity 1.5 mg weekly    EDUCATION / INSTRUCTIONS: BG monitoring instructions: Patient is instructed to check her blood sugars 3 times a day, before meals  Call [facility] if: BG persistently < 70  I reviewed the Rule of 15 for the treatment of hypoglycemia in detail with the patient. Literature supplied.    2) Diabetic complications:  Eye: Does not have known diabetic retinopathy.  Neuro/ Feet: Does not have known diabetic peripheral neuropathy .  Renal: Patient does not have known baseline CKD. She   is not  on an ACEI/ARB at present.     3) Postablative Hypothyroidism:   - No local neck symptoms  -It appears that she has been having imperfect adherence to levothyroxine -Emphasized the importance of taking levothyroxine daily, and she could double up the next day if she forgets to take it 1 day    Medication:    Levothyroxine 150 mcg, 1  tablet daily    4) Dyslipidemia:   -LDL was above goal,and I increased rosuvastatin from 5 to 10 mg -She will need updated lipid panel by next visit if not done by her PCP by then -Emphasized the importance of compliance with lipid-lowering medication   Medication Continue rosuvastatin 10 mg daily   F/U in 6  months    Signed electronically by: Mack Guise, MD  [facility]  [facility] [address] [place], [place] [address] Phone: [phone] FAX: [phone]   CC: Ronnell Freshwater, NP [place] [address] Phone: [phone]  Fax: [phone]  Return to Endocrinology clinic as below: Future Appointments  Date Time Provider [place]  12/23/2022  2:00 PM Tobb, Kardie, DO [facility] None  12/30/2022  8:30 AM Daryel November, MD [facility]  01/20/2023 11:15 AM Lorenda Peck, DPM [facility]  04/24/2023  8:30 AM [facility] None  04/30/2023 11:00 AM [facility] MM 2 [facility]  04/30/2023 11:30 AM [facility] DX DEXA 1 [facility]  05/01/2023  8:50 AM Boscia, Greer Ee, NP [facility] None

## 2022-12-23 ENCOUNTER — Encounter: Payer: Self-pay | Admitting: Cardiology

## 2022-12-23 ENCOUNTER — Ambulatory Visit: Payer: BC Managed Care – PPO | Attending: Cardiology | Admitting: Cardiology

## 2022-12-23 VITALS — BP 136/80 | HR 87 | Ht 65.0 in | Wt 219.4 lb

## 2022-12-23 DIAGNOSIS — I517 Cardiomegaly: Secondary | ICD-10-CM | POA: Diagnosis not present

## 2022-12-23 DIAGNOSIS — I10 Essential (primary) hypertension: Secondary | ICD-10-CM | POA: Diagnosis not present

## 2022-12-23 DIAGNOSIS — R5383 Other fatigue: Secondary | ICD-10-CM

## 2022-12-23 DIAGNOSIS — I2510 Atherosclerotic heart disease of native coronary artery without angina pectoris: Secondary | ICD-10-CM

## 2022-12-23 DIAGNOSIS — G4733 Obstructive sleep apnea (adult) (pediatric): Secondary | ICD-10-CM

## 2022-12-23 NOTE — Progress Notes (Signed)
Cardiology Office Note:    Date:  12/26/2022   ID:  Melanie Steele, DOB [birth year], MRN [id number]  PCP:  Ronnell Freshwater, NP  Cardiologist:  Berniece Salines, DO  Electrophysiologist:  None   Referring MD: Ronnell Freshwater, NP   " I am experiencing chest pain"  History of Present Illness:    Melanie Steele is a 62 y.o. female with a hx of mild coronary artery disease seen on coronary CT scan back in December 2022, type 2 diabetes, sleep apnea on CPAP, hypertension, obesity, hypothyroidism  In addition to this the patient notes that she is experiencing left-sided chest tightness sometimes pressure-like sensation.  It comes and goes makes it better or worse.  She does at times have some associated shortness of breath.  Past Medical History:  Diagnosis Date   Allergy    Arthritis    CPAP (continuous positive airway pressure) dependence    Hyperlipidemia    Hypertension    Myocardial infarction [facility])    Sleep apnea    Thyroid disease    Type 2 diabetes mellitus (HCC)    Vitamin D deficiency     Past Surgical History:  Procedure Laterality Date   ABDOMINAL HYSTERECTOMY     TONSILLECTOMY     TOOTH EXTRACTION     TRIGGER FINGER RELEASE      Current Medications: Current Meds  Medication Sig   amLODipine (NORVASC) 10 MG tablet Take 1 tablet (10 mg total) by mouth daily.   Continuous Blood Gluc Sensor (FREESTYLE LIBRE 2 SENSOR) MISC 1 Device by Does not apply route every 14 (fourteen) days.   empagliflozin (JARDIANCE) 25 MG TABS tablet Take 1 tablet (25 mg total) by mouth daily.   EPINEPHrine 0.3 mg/0.3 mL IJ SOAJ injection Inject 0.3 mg IM as needed for allergic reaction   fexofenadine (ALLEGRA) 180 MG tablet Take 1 tablet by mouth daily.   fluticasone (FLONASE) 50 MCG/ACT nasal spray Place into both nostrils 2 (two) times daily.   gabapentin (NEURONTIN) 100 MG capsule Take 1 capsule (100 mg total) by mouth 3 (three) times daily as needed.   glipiZIDE (GLUCOTROL) 5 MG  tablet Take 1 tablet (5 mg total) by mouth 2 (two) times daily before a meal.   glucose blood (ONETOUCH VERIO) test strip Daily   hydrochlorothiazide (HYDRODIURIL) 25 MG tablet TAKE 1 TABLET(25 MG) BY MOUTH DAILY   levothyroxine (SYNTHROID) 150 MCG tablet Take 1 tablet (150 mcg total) by mouth daily before breakfast. Take 2 tablets on Sundays, and 1 tablet the rest of the week   metoprolol succinate (TOPROL-XL) 50 MG 24 hr tablet TAKE 1 TABLET(50 MG) BY MOUTH DAILY   Multiple Vitamin (MULTIVITAMIN) tablet Take 1 tablet by mouth daily.   potassium chloride SA (KLOR-CON M) 20 MEQ tablet Take 1 tablet (20 mEq total) by mouth daily.   rosuvastatin (CRESTOR) 10 MG tablet Take 1 tablet (10 mg total) by mouth daily.   [DISCONTINUED] Dulaglutide (TRULICITY) 1.5 XF/8.1WE SOPN Inject 1.5 mg into the skin once a week.     Allergies:   Lisinopril, Other, Linagliptin, and Metformin and related   Social History   Socioeconomic History   Marital status: Widowed    Spouse name: Not on file   Number of children: Not on file   Years of education: Not on file   Highest education level: Not on file  Occupational History   Not on file  Tobacco Use   Smoking status: Never   Smokeless  tobacco: Never  Vaping Use   Vaping Use: Never used  Substance and Sexual Activity   Alcohol use: Yes    Comment: occ   Drug use: Never   Sexual activity: Not Currently  Other Topics Concern   Not on file  Social History Narrative   Not on file   Social Determinants of Health   Financial Resource Strain: Not on file  Food Insecurity: Not on file  Transportation Needs: Not on file  Physical Activity: Not on file  Stress: Not on file  Social Connections: Not on file     Family History: The patient's family history includes Colon polyps in her mother; Diabetes in her mother; Hypertension in her mother and sister; Stroke in her father. There is no history of Colon cancer, Esophageal cancer, Rectal cancer, or  Stomach cancer.  ROS:   Review of Systems  Constitution: Negative for decreased appetite, fever and weight gain.  HENT: Negative for congestion, ear discharge, hoarse voice and sore throat.   Eyes: Negative for discharge, redness, vision loss in right eye and visual halos.  Cardiovascular: Reports  chest pain and palpitations.  Negative for dyspnea on exertion, leg swelling, orthopnea.Respiratory: Negative for cough, hemoptysis, shortness of breath and snoring.   Endocrine: Negative for heat intolerance and polyphagia.  Hematologic/Lymphatic: Negative for bleeding problem. Does not bruise/bleed easily.  Skin: Negative for flushing, nail changes, rash and suspicious lesions.  Musculoskeletal: Negative for arthritis, joint pain, muscle cramps, myalgias, neck pain and stiffness.  Gastrointestinal: Negative for abdominal pain, bowel incontinence, diarrhea and excessive appetite.  Genitourinary: Negative for decreased libido, genital sores and incomplete emptying.  Neurological: Negative for brief paralysis, focal weakness, headaches and loss of balance.  Psychiatric/Behavioral: Negative for altered mental status, depression and suicidal ideas.  Allergic/Immunologic: Negative for HIV exposure and persistent infections.    EKGs/Labs/Other Studies Reviewed:    The following studies were reviewed today:   EKG: None today   CCTA 12/10/2022 Study quality: Misregistration artifact noted.   Aorta: Normal size.  No calcifications.  No dissection.   Aortic Valve:  Trileaflet.  No calcifications.   Coronary Arteries:  Normal coronary origin.  Right dominance.   RCA is a large dominant artery that gives rise to PDA and PLA. There is misregistration and motion artifact in the RCA resulting in poor visibility through the vessel. There is no obvious calcified plaques, however can not fully rule out soft plaques.   Left main is a large artery that gives rise to LAD and LCX arteries.   LAD is a  large torturous vessel with no plaques. There is a very mild myocardial bridging in the mid LAD. D1 with minimal (<24%) calcification in the proximal portion of the vessel.   LCX is a non-dominant artery that gives rise to one large OM1 branch. There is no plaque.   Coronary Calcium Score:   Left main: 0   Left anterior descending artery: 0, D1 17.8   Left circumflex artery: 0   Right coronary artery: 0   Total: 17.8   Percentile: 82   Other findings:   Normal pulmonary vein drainage into the left atrium.   Normal left atrial appendage without a thrombus.   Normal size of the pulmonary artery.   IMPRESSION: 1. Coronary calcium score of 17.8. There was 69 percentile for age and sex matched control.   2. Normal coronary origin with right dominance.   3. Minimal Coronary artery disease.   CAD-RADS 1. Minimal non-obstructive CAD (  0-24%). Consider non-atherosclerotic causes of chest pain. Consider preventive therapy and risk factor modification.    Zio monitor 11/28/2021 Patch Wear Time:  9 days and 12 hours starting November 15, 2021. Indication: Palpitations Patient had a min HR of 64 bpm, max HR of 131 bpm, and avg HR of 93 bpm. Predominant underlying rhythm was Sinus Rhythm. Isolated SVEs were rare (<1.0%), SVE Couplets were rare (<1.0%), and no SVE Triplets were present. Isolated VEs were rare (<1.0%,  1679), and no VE Couplets or VE Triplets were present.   Symptoms associated with sinus rhythm.   Conclusion: Normal/unremarkable study with no evidence of significant arrhythmia.  TTE 10/18/2021 IMPRESSIONS   1. Left ventricular ejection fraction, by estimation, is 60 to 65%. Left ventricular ejection fraction by 3D volume is 59 %. The left ventricle has  normal function. The left ventricle has no regional wall motion abnormalities. There is mild concentric left ventricular hypertrophy. Left ventricular diastolic parameters are consistent with Grade I diastolic  dysfunction (impaired relaxation). The average left ventricular global longitudinal strain is -20.4 %. The global  longitudinal strain is normal.   2. Right ventricular systolic function is normal. The right ventricular  size is normal. Tricuspid regurgitation signal is inadequate for assessing  PA pressure.   3. The mitral valve is grossly normal. No evidence of mitral valve  regurgitation. No evidence of mitral stenosis.   4. The aortic valve is tricuspid. There is mild calcification of the  aortic valve. Aortic valve regurgitation is mild. No aortic stenosis is  present.   Comparison(s): Prior images unable to be directly viewed, comparison made  by report only. 07/11/21 EF 55-60%.   FINDINGS   Left Ventricle: Left ventricular ejection fraction, by estimation, is 60  to 65%. Left ventricular ejection fraction by 3D volume is 59 %. The left  ventricle has normal function. The left ventricle has no regional wall  motion abnormalities. The average  left ventricular global longitudinal strain is -20.4 %. The global longitudinal strain is normal. The left ventricular internal cavity size was normal in size. There is mild concentric left ventricular hypertrophy.  Left ventricular diastolic parameters  are consistent with Grade I diastolic dysfunction (impaired relaxation).   Right Ventricle: The right ventricular size is normal. No increase in  right ventricular wall thickness. Right ventricular systolic function is  normal. Tricuspid regurgitation signal is inadequate for assessing PA  pressure.   Left Atrium: Left atrial size was normal in size.   Right Atrium: Right atrial size was normal in size.   Pericardium: Trivial pericardial effusion is present.   Mitral Valve: The mitral valve is grossly normal. No evidence of mitral  valve regurgitation. No evidence of mitral valve stenosis.   Tricuspid Valve: The tricuspid valve is normal in structure. Tricuspid  valve regurgitation is  trivial. No evidence of tricuspid stenosis.   Aortic Valve: The aortic valve is tricuspid. There is mild calcification  of the aortic valve. Aortic valve regurgitation is mild. Aortic  regurgitation PHT measures 335 msec. No aortic stenosis is present.   Pulmonic Valve: The pulmonic valve was not well visualized. Pulmonic valve  regurgitation is trivial. No evidence of pulmonic stenosis.   Aorta: The aortic root and ascending aorta are structurally normal, with  no evidence of dilitation.   IAS/Shunts: The atrial septum is grossly normal.    Recent Labs: 02/04/2022: ALT 15; BUN 10; Creatinine, Ser 0.54; Potassium 4.0; Sodium 142; TSH 2.06  Recent Lipid Panel  Component Value Date/Time   CHOL 187 02/04/2022 0942   CHOL 188 07/17/2020 0913   TRIG 181.0 (H) 02/04/2022 0942   HDL 47.10 02/04/2022 0942   HDL 57 07/17/2020 0913   CHOLHDL 4 02/04/2022 0942   VLDL 36.2 02/04/2022 0942   LDLCALC 103 (H) 02/04/2022 0942   LDLCALC 111 (H) 07/17/2020 0913    Physical Exam:    VS:  BP 136/80   Pulse 87   Ht '5\' 5"'$  (1.651 m)   Wt 99.5 kg   SpO2 97%   BMI 36.51 kg/m     Wt Readings from Last 3 Encounters:  12/23/22 99.5 kg  12/04/22 99.8 kg  10/31/22 100.3 kg     GEN: Well nourished, well developed in no acute distress HEENT: Normal NECK: No JVD; No carotid bruits LYMPHATICS: No lymphadenopathy CARDIAC: S1S2 noted,RRR, no murmurs, rubs, gallops RESPIRATORY:  Clear to auscultation without rales, wheezing or rhonchi  ABDOMEN: Soft, non-tender, non-distended, +bowel sounds, no guarding. EXTREMITIES: No edema, No cyanosis, no clubbing MUSCULOSKELETAL:  No deformity  SKIN: Warm and dry NEUROLOGIC:  Alert and oriented x 3, non-focal PSYCHIATRIC:  Normal affect, good insight  ASSESSMENT:    1. Essential hypertension   2. Fatigue, unspecified type   3. Minimal CAD   4. Mild left ventricular hypertrophy   5. OSA (obstructive sleep apnea)     PLAN:    She is doing well  from a CV standpoint.   CAD - no angina symptoms.  Hyperlipidemia - continue with current statin medication.  Blood pressure is acceptable, continue with current antihypertensive regimen.  Continue use of your CPAP.  The patient understands the need to lose weight with diet and exercise. We have discussed specific strategies for this.   The patient is in agreement with the above plan. The patient left the office in stable condition.  The patient will follow up in 6 months or sooner if needed.   Medication Adjustments/Labs and Tests Ordered: Current medicines are reviewed at length with the patient today.  Concerns regarding medicines are outlined above.  Orders Placed This Encounter  Procedures   VITAMIN D 25 Hydroxy (Vit-D Deficiency, Fractures)   EKG 12-Lead    No orders of the defined types were placed in this encounter.    Patient Instructions  Medication Instructions:  Your physician recommends that you continue on your current medications as directed. Please refer to the Current Medication list given to you today.  *If you need a refill on your cardiac medications before your next appointment, please call your pharmacy*   Lab Work: Your physician recommends that you have the following lab drawn today: Vitamin D level  If you have labs (blood work) drawn today and your tests are completely normal, you will receive your results only by: [place] (if you have MyChart) OR A paper copy in the mail If you have any lab test that is abnormal or we need to change your treatment, we will call you to review the results.   Testing/Procedures: NONE   Follow-Up: At [facility], you and your health needs are our priority.  As part of our continuing mission to provide you with exceptional heart care, we have created designated Provider Care Teams.  These Care Teams include your primary Cardiologist (physician) and Advanced Practice Providers (APPs -  Physician  Assistants and Nurse Practitioners) who all work together to provide you with the care you need, when you need it.  We recommend signing up  for the patient portal called "MyChart".  Sign up information is provided on this After Visit Summary.  MyChart is used to connect with patients for Virtual Visits (Telemedicine).  Patients are able to view lab/test results, encounter notes, upcoming appointments, etc.  Non-urgent messages can be sent to your provider as well.   To learn more about what you can do with MyChart, go to [email or web address].    Your next appointment:   1 year(s)  The format for your next appointment:   In Person  Provider:   Berniece Salines, DO     Adopting a Healthy Lifestyle.  Know what a healthy weight is for you (roughly BMI <25) and aim to maintain this   Aim for 7+ servings of fruits and vegetables daily   65-80+ fluid ounces of water or unsweet tea for healthy kidneys   Limit to max 1 drink of alcohol per day; avoid smoking/tobacco   Limit animal fats in diet for cholesterol and heart health - choose grass fed whenever available   Avoid highly processed foods, and foods high in saturated/trans fats   Aim for low stress - take time to unwind and care for your mental health   Aim for 150 min of moderate intensity exercise weekly for heart health, and weights twice weekly for bone health   Aim for 7-9 hours of sleep daily   When it comes to diets, agreement about the perfect plan isnt easy to find, even among the experts. Experts at the [employer] developed an idea known as the Healthy Eating Plate. Just imagine a plate divided into logical, healthy portions.   The emphasis is on diet quality:   Load up on vegetables and fruits - one-half of your plate: Aim for color and variety, and remember that potatoes dont count.   Go for whole grains - one-quarter of your plate: Whole wheat, barley, wheat berries, quinoa, oats, brown rice, and  foods made with them. If you want pasta, go with whole wheat pasta.   Protein power - one-quarter of your plate: Fish, chicken, beans, and nuts are all healthy, versatile protein sources. Limit red meat.   The diet, however, does go beyond the plate, offering a few other suggestions.   Use healthy plant oils, such as olive, canola, soy, corn, sunflower and peanut. Check the labels, and avoid partially hydrogenated oil, which have unhealthy trans fats.   If youre thirsty, drink water. Coffee and tea are good in moderation, but skip sugary drinks and limit milk and dairy products to one or two daily servings.   The type of carbohydrate in the diet is more important than the amount. Some sources of carbohydrates, such as vegetables, fruits, whole grains, and beans-are healthier than others.   Finally, stay active  Signed, Berniece Salines, DO  12/26/2022 8:53 PM    [facility]

## 2022-12-23 NOTE — Patient Instructions (Signed)
Medication Instructions:  Your physician recommends that you continue on your current medications as directed. Please refer to the Current Medication list given to you today.  *If you need a refill on your cardiac medications before your next appointment, please call your pharmacy*   Lab Work: Your physician recommends that you have the following lab drawn today: Vitamin D level  If you have labs (blood work) drawn today and your tests are completely normal, you will receive your results only by: [place] (if you have MyChart) OR A paper copy in the mail If you have any lab test that is abnormal or we need to change your treatment, we will call you to review the results.   Testing/Procedures: NONE   Follow-Up: At [facility], you and your health needs are our priority.  As part of our continuing mission to provide you with exceptional heart care, we have created designated Provider Care Teams.  These Care Teams include your primary Cardiologist (physician) and Advanced Practice Providers (APPs -  Physician Assistants and Nurse Practitioners) who all work together to provide you with the care you need, when you need it.  We recommend signing up for the patient portal called "MyChart".  Sign up information is provided on this After Visit Summary.  MyChart is used to connect with patients for Virtual Visits (Telemedicine).  Patients are able to view lab/test results, encounter notes, upcoming appointments, etc.  Non-urgent messages can be sent to your provider as well.   To learn more about what you can do with MyChart, go to [email or web address].    Your next appointment:   1 year(s)  The format for your next appointment:   In Person  Provider:   Berniece Salines, DO

## 2022-12-24 LAB — VITAMIN D 25 HYDROXY (VIT D DEFICIENCY, FRACTURES): Vit D, 25-Hydroxy: 33.8 ng/mL (ref 30.0–100.0)

## 2022-12-26 ENCOUNTER — Other Ambulatory Visit: Payer: Self-pay | Admitting: Internal Medicine

## 2022-12-29 ENCOUNTER — Encounter: Payer: Self-pay | Admitting: Gastroenterology

## 2022-12-30 ENCOUNTER — Encounter: Payer: Self-pay | Admitting: Gastroenterology

## 2022-12-30 ENCOUNTER — Ambulatory Visit (AMBULATORY_SURGERY_CENTER): Payer: BC Managed Care – PPO | Admitting: Gastroenterology

## 2022-12-30 VITALS — BP 130/66 | HR 85 | Temp 97.8°F | Resp 19 | Ht 65.0 in | Wt 220.0 lb

## 2022-12-30 DIAGNOSIS — D123 Benign neoplasm of transverse colon: Secondary | ICD-10-CM

## 2022-12-30 DIAGNOSIS — Z1211 Encounter for screening for malignant neoplasm of colon: Secondary | ICD-10-CM | POA: Diagnosis not present

## 2022-12-30 MED ORDER — SODIUM CHLORIDE 0.9 % IV SOLN: 500.0000 mL | Freq: Once | INTRAVENOUS | Status: DC

## 2022-12-30 NOTE — Progress Notes (Signed)
Called to room to assist during endoscopic procedure.  Patient ID and intended procedure confirmed with present staff. Received instructions for my participation in the procedure from the performing physician.

## 2022-12-30 NOTE — Progress Notes (Signed)
Report to PACU, RN, vss, BBS= Clear.  

## 2022-12-30 NOTE — Patient Instructions (Signed)
Handouts given for polyps and diverticulosis.  YOU HAD AN ENDOSCOPIC PROCEDURE TODAY AT [facility]:   Refer to the procedure report that was given to you for any specific questions about what was found during the examination.  If the procedure report does not answer your questions, please call your gastroenterologist to clarify.  If you requested that your care partner not be given the details of your procedure findings, then the procedure report has been included in a sealed envelope for you to review at your convenience later.  YOU SHOULD EXPECT: Some feelings of bloating in the abdomen. Passage of more gas than usual.  Walking can help get rid of the air that was put into your GI tract during the procedure and reduce the bloating. If you had a lower endoscopy (such as a colonoscopy or flexible sigmoidoscopy) you may notice spotting of blood in your stool or on the toilet paper. If you underwent a bowel prep for your procedure, you may not have a normal bowel movement for a few days.  Please Note:  You might notice some irritation and congestion in your nose or some drainage.  This is from the oxygen used during your procedure.  There is no need for concern and it should clear up in a day or so.  SYMPTOMS TO REPORT IMMEDIATELY:  Following lower endoscopy (colonoscopy):  Excessive amounts of blood in the stool  Significant tenderness or worsening of abdominal pains  Swelling of the abdomen that is new, acute  Fever of 100F or higher  For urgent or emergent issues, a gastroenterologist can be reached at any hour by calling [phone]. Do not use MyChart messaging for urgent concerns.    DIET:  We do recommend a small meal at first, but then you may proceed to your regular diet.  Drink plenty of fluids but you should avoid alcoholic beverages for 24 hours.  ACTIVITY:  You should plan to take it easy for the rest of today and you should NOT DRIVE or use heavy machinery  until tomorrow (because of the sedation medicines used during the test).    FOLLOW UP: Our staff will call the number listed on your records the next business day following your procedure.  We will call around 7:15- 8:00 am to check on you and address any questions or concerns that you may have regarding the information given to you following your procedure. If we do not reach you, we will leave a message.     If any biopsies were taken you will be contacted by phone or by letter within the next 1-3 weeks.  Please call us at [phone] if you have not heard about the biopsies in 3 weeks.    SIGNATURES/CONFIDENTIALITY: You and/or your care partner have signed paperwork which will be entered into your electronic medical record.  These signatures attest to the fact that that the information above on your After Visit Summary has been reviewed and is understood.  Full responsibility of the confidentiality of this discharge information lies with you and/or your care-partner.

## 2022-12-30 NOTE — Progress Notes (Signed)
Pt's states no medical or surgical changes since previsit or office visit. 

## 2022-12-30 NOTE — Op Note (Signed)
[facility] Patient Name: Melanie Steele Procedure Date: 12/30/2022 8:24 AM MRN: [id number] Endoscopist: Antigo. Candis Schatz , MD, [id number] Age: 62 Referring MD:  Date of Birth: 1961/04/16 Gender: Female Account #: [id number] Procedure:                Colonoscopy Indications:              Screening for colorectal malignant neoplasm, This                            is the patient's first colonoscopy Medicines:                Monitored Anesthesia Care Procedure:                Pre-Anesthesia Assessment:                           - Prior to the procedure, a History and Physical                            was performed, and patient medications and                            allergies were reviewed. The patient's tolerance of                            previous anesthesia was also reviewed. The risks                            and benefits of the procedure and the sedation                            options and risks were discussed with the patient.                            All questions were answered, and informed consent                            was obtained. Prior Anticoagulants: The patient has                            taken no anticoagulant or antiplatelet agents. ASA                            Grade Assessment: II - A patient with mild systemic                            disease. After reviewing the risks and benefits,                            the patient was deemed in satisfactory condition to                            undergo the procedure.  After obtaining informed consent, the colonoscope                            was passed under direct vision. Throughout the                            procedure, the patient's blood pressure, pulse, and                            oxygen saturations were monitored continuously. The                            Olympus CF-HQ190L ([id number]) Colonoscope was                            introduced through the  anus and advanced to the the                            terminal ileum, with identification of the                            appendiceal orifice and IC valve. The colonoscopy                            was performed without difficulty. The patient                            tolerated the procedure well. The quality of the                            bowel preparation was good. The terminal ileum,                            ileocecal valve, appendiceal orifice, and rectum                            were photographed. The bowel preparation used was                            SUPREP via split dose instruction. Scope In: 8:31:31 AM Scope Out: 8:47:42 AM Scope Withdrawal Time: 0 hours 10 minutes 0 seconds  Total Procedure Duration: 0 hours 16 minutes 11 seconds  Findings:                 The perianal and digital rectal examinations were                            normal. Pertinent negatives include normal                            sphincter tone and no palpable rectal lesions.                           A 6 mm polyp was found in the hepatic flexure. The  polyp was sessile. The polyp was removed with a                            cold snare. Resection and retrieval were complete.                            Estimated blood loss was minimal.                           A 4 mm polyp was found in the splenic flexure. The                            polyp was flat. The polyp was removed with a cold                            snare. Resection and retrieval were complete.                            Estimated blood loss was minimal.                           A single small-mouthed diverticulum was found in                            the ascending colon.                           The exam was otherwise normal throughout the                            examined colon.                           The terminal ileum appeared normal.                           The retroflexed view of the  distal rectum and anal                            verge was normal and showed no anal or rectal                            abnormalities. Complications:            No immediate complications. Estimated Blood Loss:     Estimated blood loss was minimal. Impression:               - One 6 mm polyp at the hepatic flexure, removed                            with a cold snare. Resected and retrieved.                           - One 4 mm polyp at the splenic flexure, removed  with a cold snare. Resected and retrieved.                           - Diverticulosis in the ascending colon.                           - The examined portion of the ileum was normal.                           - The distal rectum and anal verge are normal on                            retroflexion view. Recommendation:           - Patient has a contact number available for                            emergencies. The signs and symptoms of potential                            delayed complications were discussed with the                            patient. Return to normal activities tomorrow.                            Written discharge instructions were provided to the                            patient.                           - Resume previous diet.                           - Continue present medications.                           - Await pathology results.                           - Repeat colonoscopy (date not yet determined) for                            surveillance based on pathology results. Ruthel Martine E. Candis Schatz, MD 12/30/2022 8:51:46 AM This report has been signed electronically.

## 2022-12-30 NOTE — Progress Notes (Signed)
Approx 4 min abd pressure needed to get to cecum ([id number])  Pt given robinul and zofran to try and prevent emesis.

## 2022-12-30 NOTE — Progress Notes (Signed)
[facility] History and Physical   Primary Care Physician:  Ronnell Freshwater, NP   Reason for Procedure:   Colon cancer screening  Plan:    Screening colonoscopy     HPI: Melanie Steele is a 62 y.o. female undergoing initial average risk screening colonoscopy.  She has no family history of colon cancer and no chronic GI symptoms.    Past Medical History:  Diagnosis Date   Allergy    Arthritis    CPAP (continuous positive airway pressure) dependence    Hyperlipidemia    Hypertension    Myocardial infarction [facility])    Sleep apnea    Thyroid disease    Type 2 diabetes mellitus ([place])    Vitamin D deficiency     Past Surgical History:  Procedure Laterality Date   ABDOMINAL HYSTERECTOMY     TONSILLECTOMY     TOOTH EXTRACTION     TRIGGER FINGER RELEASE      Prior to Admission medications   Medication Sig Start Date End Date Taking? Authorizing Provider  amLODipine (NORVASC) 10 MG tablet Take 1 tablet (10 mg total) by mouth daily. 11/12/22  Yes Boscia, Greer Ee, NP  Continuous Blood Gluc Sensor (FREESTYLE LIBRE 2 SENSOR) MISC 1 Device by Does not apply route every 14 (fourteen) days. 02/04/22  Yes Shamleffer, Melanie Crazier, MD  empagliflozin (JARDIANCE) 25 MG TABS tablet Take 1 tablet (25 mg total) by mouth daily. 02/04/22  Yes Shamleffer, Melanie Crazier, MD  fexofenadine (ALLEGRA) 180 MG tablet Take 1 tablet by mouth daily.   Yes [provider]  fluticasone (FLONASE) 50 MCG/ACT nasal spray Place into both nostrils 2 (two) times daily.   Yes [provider]  glipiZIDE (GLUCOTROL) 5 MG tablet Take 1 tablet (5 mg total) by mouth 2 (two) times daily before a meal. 02/04/22  Yes Shamleffer, Melanie Crazier, MD  glucose blood (ONETOUCH VERIO) test strip Daily 01/22/21  Yes Shamleffer, Melanie Crazier, MD  hydrochlorothiazide (HYDRODIURIL) 25 MG tablet TAKE 1 TABLET(25 MG) BY MOUTH DAILY 08/27/22  Yes Ronnell Freshwater, NP  levothyroxine (SYNTHROID)  150 MCG tablet Take 1 tablet (150 mcg total) by mouth daily before breakfast. Take 2 tablets on Sundays, and 1 tablet the rest of the week 02/05/22  Yes Shamleffer, Melanie Crazier, MD  metoprolol succinate (TOPROL-XL) 50 MG 24 hr tablet TAKE 1 TABLET(50 MG) BY MOUTH DAILY 02/17/22  Yes Tobb, Kardie, DO  Multiple Vitamin (MULTIVITAMIN) tablet Take 1 tablet by mouth daily.   Yes [provider]  potassium chloride SA (KLOR-CON M) 20 MEQ tablet Take 1 tablet (20 mEq total) by mouth daily. 09/23/22  Yes Boscia, Greer Ee, NP  rosuvastatin (CRESTOR) 10 MG tablet Take 1 tablet (10 mg total) by mouth daily. 02/05/22  Yes Shamleffer, Melanie Crazier, MD  TRULICITY 1.5 PI/9.5JO SOPN ADMINISTER 1.5 MG UNDER THE SKIN 1 TIME A WEEK 12/26/22  Yes Shamleffer, Melanie Crazier, MD  EPINEPHrine 0.3 mg/0.3 mL IJ SOAJ injection Inject 0.3 mg IM as needed for allergic reaction 10/28/21   Ronnell Freshwater, NP  gabapentin (NEURONTIN) 100 MG capsule Take 1 capsule (100 mg total) by mouth 3 (three) times daily as needed. 02/08/21   Nicolette Bang, MD    Current Outpatient Medications  Medication Sig Dispense Refill   amLODipine (NORVASC) 10 MG tablet Take 1 tablet (10 mg total) by mouth daily. 90 tablet 1   Continuous Blood Gluc Sensor (FREESTYLE LIBRE 2 SENSOR) MISC 1 Device by Does not apply  route every 14 (fourteen) days. 6 each 3   empagliflozin (JARDIANCE) 25 MG TABS tablet Take 1 tablet (25 mg total) by mouth daily. 90 tablet 3   fexofenadine (ALLEGRA) 180 MG tablet Take 1 tablet by mouth daily.     fluticasone (FLONASE) 50 MCG/ACT nasal spray Place into both nostrils 2 (two) times daily.     glipiZIDE (GLUCOTROL) 5 MG tablet Take 1 tablet (5 mg total) by mouth 2 (two) times daily before a meal. 180 tablet 3   glucose blood (ONETOUCH VERIO) test strip Daily 150 each 3   hydrochlorothiazide (HYDRODIURIL) 25 MG tablet TAKE 1 TABLET(25 MG) BY MOUTH DAILY 90 tablet 1   levothyroxine (SYNTHROID) 150 MCG  tablet Take 1 tablet (150 mcg total) by mouth daily before breakfast. Take 2 tablets on Sundays, and 1 tablet the rest of the week 90 tablet 3   metoprolol succinate (TOPROL-XL) 50 MG 24 hr tablet TAKE 1 TABLET(50 MG) BY MOUTH DAILY 90 tablet 3   Multiple Vitamin (MULTIVITAMIN) tablet Take 1 tablet by mouth daily.     potassium chloride SA (KLOR-CON M) 20 MEQ tablet Take 1 tablet (20 mEq total) by mouth daily. 90 tablet 1   rosuvastatin (CRESTOR) 10 MG tablet Take 1 tablet (10 mg total) by mouth daily. 90 tablet 3   TRULICITY 1.5 WR/6.0AV SOPN ADMINISTER 1.5 MG UNDER THE SKIN 1 TIME A WEEK 6 mL 3   EPINEPHrine 0.3 mg/0.3 mL IJ SOAJ injection Inject 0.3 mg IM as needed for allergic reaction 1 each 1   gabapentin (NEURONTIN) 100 MG capsule Take 1 capsule (100 mg total) by mouth 3 (three) times daily as needed. 90 capsule 1   Current Facility-Administered Medications  Medication Dose Route Frequency Provider Last Rate Last Admin   0.9 %  sodium chloride infusion  500 mL Intravenous Once Daryel November, MD        Allergies as of 12/30/2022 - Review Complete 12/30/2022  Allergen Reaction Noted   Lisinopril Anaphylaxis 02/25/2013   Other Anaphylaxis 02/23/2019   Linagliptin Other (See Comments) 04/14/2013   Metformin and related Diarrhea 06/29/2014    Family History  Problem Relation Age of Onset   Colon polyps Mother    Diabetes Mother    Hypertension Mother    Stroke Father    Hypertension Sister    Colon cancer Neg Hx    Esophageal cancer Neg Hx    Rectal cancer Neg Hx    Stomach cancer Neg Hx     Social History   Socioeconomic History   Marital status: Widowed    Spouse name: Not on file   Number of children: Not on file   Years of education: Not on file   Highest education level: Not on file  Occupational History   Not on file  Tobacco Use   Smoking status: Never   Smokeless tobacco: Never  Vaping Use   Vaping Use: Never used  Substance and Sexual Activity    Alcohol use: Yes    Comment: occ   Drug use: Never   Sexual activity: Not Currently  Other Topics Concern   Not on file  Social History Narrative   Not on file   Social Determinants of Health   Financial Resource Strain: Not on file  Food Insecurity: Not on file  Transportation Needs: Not on file  Physical Activity: Not on file  Stress: Not on file  Social Connections: Not on file  Intimate Partner Violence: Not on file  Review of Systems:  All other review of systems negative except as mentioned in the HPI.  Physical Exam: Vital signs BP 126/64   Pulse 95   Temp 97.8 F (36.6 C)   Ht '5\' 5"'$  (1.651 m)   Wt 220 lb (99.8 kg)   SpO2 94%   BMI 36.61 kg/m   General:   Alert,  Well-developed, well-nourished, pleasant and cooperative in NAD Airway:  Mallampati 3 Lungs:  Clear throughout to auscultation.   Heart:  Regular rate and rhythm; no murmurs, clicks, rubs,  or gallops. Abdomen:  Soft, nontender and nondistended. Normal bowel sounds.   Neuro/Psych:  Normal mood and affect. A and O x 3   Susi Goslin E. Candis Schatz, MD [facility]

## 2022-12-31 ENCOUNTER — Telehealth: Payer: Self-pay | Admitting: *Deleted

## 2022-12-31 NOTE — Telephone Encounter (Signed)
  Follow up Call-     12/30/2022    7:37 AM  Call back number  Post procedure Call Back phone  # [phone]  Permission to leave phone message Yes     Patient questions:  Do you have a fever, pain , or abdominal swelling? No. Pain Score  0 *  Have you tolerated food without any problems? Yes.    Have you been able to return to your normal activities? Yes.    Do you have any questions about your discharge instructions: Diet   No. Medications  No. Follow up visit  No.  Do you have questions or concerns about your Care? No.  Actions: * If pain score is 4 or above: No action needed, pain <4.

## 2023-01-01 ENCOUNTER — Other Ambulatory Visit (HOSPITAL_COMMUNITY): Payer: Self-pay

## 2023-01-02 ENCOUNTER — Other Ambulatory Visit (HOSPITAL_COMMUNITY): Payer: Self-pay

## 2023-01-02 MED ORDER — TRULICITY 1.5 MG/0.5ML ~~LOC~~ SOAJ
1.5000 mg | SUBCUTANEOUS | 3 refills | Status: DC
  Filled 2023-01-02: qty 2, 28d supply, fill #0
  Filled 2023-01-26: qty 2, 28d supply, fill #1

## 2023-01-02 NOTE — Progress Notes (Signed)
Melanie Steele,  The two polyps which I removed during your recent procedure were proven to be completely benign but are considered "pre-cancerous" polyps that MAY have grown into cancer if they had not been removed.  Studies shows that at least 20% of women over age 61 and 30% of men over age 34 have pre-cancerous polyps.  Based on current nationally recognized surveillance guidelines, I recommend that you have a repeat colonoscopy in 7 years.   If you develop any new rectal bleeding, abdominal pain or significant bowel habit changes, please contact me before then.

## 2023-01-20 ENCOUNTER — Ambulatory Visit: Payer: BC Managed Care – PPO | Admitting: Podiatry

## 2023-01-23 ENCOUNTER — Other Ambulatory Visit (HOSPITAL_COMMUNITY): Payer: Self-pay

## 2023-01-26 ENCOUNTER — Encounter: Payer: Self-pay | Admitting: Podiatry

## 2023-01-26 ENCOUNTER — Other Ambulatory Visit (HOSPITAL_COMMUNITY): Payer: Self-pay

## 2023-01-26 ENCOUNTER — Ambulatory Visit (INDEPENDENT_AMBULATORY_CARE_PROVIDER_SITE_OTHER): Payer: BC Managed Care – PPO | Admitting: Podiatry

## 2023-01-26 DIAGNOSIS — B351 Tinea unguium: Secondary | ICD-10-CM

## 2023-01-26 DIAGNOSIS — M79674 Pain in right toe(s): Secondary | ICD-10-CM | POA: Diagnosis not present

## 2023-01-26 DIAGNOSIS — M79675 Pain in left toe(s): Secondary | ICD-10-CM | POA: Diagnosis not present

## 2023-01-26 DIAGNOSIS — E119 Type 2 diabetes mellitus without complications: Secondary | ICD-10-CM | POA: Diagnosis not present

## 2023-01-26 NOTE — Progress Notes (Signed)
  Subjective:  Patient ID: Melanie Steele, female    DOB: 19-Feb-1961,   MRN: [id number]  Chief Complaint  Patient presents with   Nail Problem    [facility] BS-218 A1C-9.2    62 y.o. female presents for concern of thickened elongated and painful nails that are difficult to trim. Requesting to have them trimmed today. Relates burning and tingling in their feet. Patient is diabetic and last A1c was  Lab Results  Component Value Date   HGBA1C 9.4 (A) 02/04/2022   .   PCP:  Ronnell Freshwater, NP    . Denies any other pedal complaints. Denies n/v/f/c.   Past Medical History:  Diagnosis Date   Allergy    Arthritis    CPAP (continuous positive airway pressure) dependence    Hyperlipidemia    Hypertension    Myocardial infarction [facility])    Sleep apnea    Thyroid disease    Type 2 diabetes mellitus ([facility])    Vitamin D deficiency     Objective:  Physical Exam: Vascular: DP/PT pulses 2/4 bilateral. CFT <3 seconds. Absent hair growth on digits. Edema noted to bilateral lower extremities. Xerosis noted bilaterally.  Skin. No lacerations or abrasions bilateral feet. Nails 1-5 bilateral  are thickened discolored and elongated with subungual debris.  Musculoskeletal: MMT 5/5 bilateral lower extremities in DF, PF, Inversion and Eversion. Deceased ROM in DF of ankle joint.  Neurological: Sensation intact to light touch. Protective sensation diminished bilateral.    Assessment:   1. Pain due to onychomycosis of toenails of both feet   2. Type 2 diabetes mellitus without complication, without long-term current use of insulin ([place])      Plan:  Patient was evaluated and treated and all questions answered. -Discussed and educated patient on diabetic foot care, especially with  regards to the vascular, neurological and musculoskeletal systems.  -Stressed the importance of good glycemic control and the detriment of not  controlling glucose levels in relation to the foot. -Discussed supportive  shoes at all times and checking feet regularly.  -Mechanically debrided all nails 1-5 bilateral using sterile nail nipper and filed with dremel without incident  -Answered all patient questions -Patient to return  in 3 months for at risk foot care -Patient advised to call the office if any problems or questions arise in the meantime.   Lorenda Peck, DPM

## 2023-01-30 ENCOUNTER — Telehealth: Payer: Self-pay

## 2023-01-30 NOTE — Telephone Encounter (Signed)
Pt was advised she needed an authorization to override plan limitations for Trulicity to allow pt to get refills when she finds them at outside pharmacies.

## 2023-02-06 ENCOUNTER — Other Ambulatory Visit: Payer: Self-pay | Admitting: Nurse Practitioner

## 2023-02-06 DIAGNOSIS — I10 Essential (primary) hypertension: Secondary | ICD-10-CM

## 2023-02-09 ENCOUNTER — Other Ambulatory Visit: Payer: Self-pay | Admitting: Nurse Practitioner

## 2023-02-09 DIAGNOSIS — Z91018 Allergy to other foods: Secondary | ICD-10-CM

## 2023-02-10 ENCOUNTER — Other Ambulatory Visit (HOSPITAL_COMMUNITY): Payer: Self-pay

## 2023-02-12 ENCOUNTER — Other Ambulatory Visit: Payer: Self-pay | Admitting: Internal Medicine

## 2023-02-20 ENCOUNTER — Other Ambulatory Visit: Payer: Self-pay | Admitting: Internal Medicine

## 2023-02-26 ENCOUNTER — Other Ambulatory Visit (HOSPITAL_COMMUNITY): Payer: Self-pay

## 2023-02-26 ENCOUNTER — Other Ambulatory Visit: Payer: Self-pay

## 2023-02-26 DIAGNOSIS — E1165 Type 2 diabetes mellitus with hyperglycemia: Secondary | ICD-10-CM

## 2023-02-26 MED ORDER — GLIPIZIDE 5 MG PO TABS: 5.0000 mg | ORAL_TABLET | Freq: Two times a day (BID) | ORAL | 3 refills | Status: DC

## 2023-02-26 MED ORDER — ROSUVASTATIN CALCIUM 10 MG PO TABS: 10.0000 mg | ORAL_TABLET | Freq: Every day | ORAL | 3 refills | Status: DC

## 2023-02-26 NOTE — Telephone Encounter (Signed)
Called [phone] and spoke with representative, was told that no PA is needed, as her plan doesn't allow for 90 day fills. Stated that there is an option to "build a case" but that it is always denied. There is no pharmacy override, otherwise, that she is aware of to allow pt to exceed plan limitations as medication is covered if billed as a 30 day supply. I also asked if there were preferred products that might be cheaper for the patient, or more available within this class of medication and was told that there are no preferred listed, as all other drugs in this classification would require a PA. Please advise.  Per test claim:

## 2023-02-27 ENCOUNTER — Telehealth: Payer: Self-pay

## 2023-02-27 MED ORDER — SEMAGLUTIDE(0.25 OR 0.5MG/DOS) 2 MG/3ML ~~LOC~~ SOPN: 0.5000 mg | PEN_INJECTOR | SUBCUTANEOUS | 11 refills | Status: DC

## 2023-02-27 NOTE — Telephone Encounter (Signed)
Patient would like to try another injection if possible and would like it sent to [facility] on [address].

## 2023-03-23 ENCOUNTER — Ambulatory Visit: Payer: BC Managed Care – PPO | Admitting: Internal Medicine

## 2023-04-13 ENCOUNTER — Other Ambulatory Visit: Payer: Self-pay

## 2023-04-13 DIAGNOSIS — I10 Essential (primary) hypertension: Secondary | ICD-10-CM

## 2023-04-13 MED ORDER — POTASSIUM CHLORIDE CRYS ER 20 MEQ PO TBCR: 20.0000 meq | EXTENDED_RELEASE_TABLET | Freq: Every day | ORAL | 1 refills | Status: DC

## 2023-04-15 ENCOUNTER — Other Ambulatory Visit: Payer: Self-pay

## 2023-04-15 DIAGNOSIS — Z130 Encounter for screening for diseases of the blood and blood-forming organs and certain disorders involving the immune mechanism: Secondary | ICD-10-CM

## 2023-04-15 DIAGNOSIS — Z0000 Encounter for general adult medical examination without abnormal findings: Secondary | ICD-10-CM

## 2023-04-17 ENCOUNTER — Other Ambulatory Visit: Payer: Self-pay

## 2023-04-17 MED ORDER — EMPAGLIFLOZIN 25 MG PO TABS: 25.0000 mg | ORAL_TABLET | Freq: Every day | ORAL | 3 refills | Status: DC

## 2023-04-24 ENCOUNTER — Other Ambulatory Visit: Payer: BC Managed Care – PPO

## 2023-04-24 DIAGNOSIS — E785 Hyperlipidemia, unspecified: Secondary | ICD-10-CM | POA: Diagnosis not present

## 2023-04-24 DIAGNOSIS — Z1329 Encounter for screening for other suspected endocrine disorder: Secondary | ICD-10-CM

## 2023-04-24 DIAGNOSIS — Z1321 Encounter for screening for nutritional disorder: Secondary | ICD-10-CM | POA: Diagnosis not present

## 2023-04-24 DIAGNOSIS — Z0000 Encounter for general adult medical examination without abnormal findings: Secondary | ICD-10-CM | POA: Diagnosis not present

## 2023-04-25 LAB — CBC
Hematocrit: 45.3 % (ref 34.0–46.6)
Hemoglobin: 14.8 g/dL (ref 11.1–15.9)
MCH: 30.8 pg (ref 26.6–33.0)
MCHC: 32.7 g/dL (ref 31.5–35.7)
MCV: 94 fL (ref 79–97)
Platelets: 235 10*3/uL (ref 150–450)
RBC: 4.81 x10E6/uL (ref 3.77–5.28)
RDW: 12 % (ref 11.7–15.4)
WBC: 8.2 10*3/uL (ref 3.4–10.8)

## 2023-04-25 LAB — COMPREHENSIVE METABOLIC PANEL
ALT: 15 IU/L (ref 0–32)
AST: 16 IU/L (ref 0–40)
Albumin/Globulin Ratio: 1.6 (ref 1.2–2.2)
Albumin: 4.5 g/dL (ref 3.9–4.9)
Alkaline Phosphatase: 66 IU/L (ref 44–121)
BUN/Creatinine Ratio: 11 — ABNORMAL LOW (ref 12–28)
BUN: 6 mg/dL — ABNORMAL LOW (ref 8–27)
Bilirubin Total: 1.1 mg/dL (ref 0.0–1.2)
CO2: 21 mmol/L (ref 20–29)
Calcium: 9.8 mg/dL (ref 8.7–10.3)
Chloride: 102 mmol/L (ref 96–106)
Creatinine, Ser: 0.55 mg/dL — ABNORMAL LOW (ref 0.57–1.00)
Globulin, Total: 2.8 g/dL (ref 1.5–4.5)
Glucose: 181 mg/dL — ABNORMAL HIGH (ref 70–99)
Potassium: 3.6 mmol/L (ref 3.5–5.2)
Sodium: 144 mmol/L (ref 134–144)
Total Protein: 7.3 g/dL (ref 6.0–8.5)
eGFR: 104 mL/min/{1.73_m2} (ref >59)

## 2023-04-25 LAB — LIPID PANEL
Chol/HDL Ratio: 2.7 ratio (ref 0.0–4.4)
Cholesterol, Total: 137 mg/dL (ref 100–199)
HDL: 50 mg/dL (ref >39)
LDL Chol Calc (NIH): 67 mg/dL (ref 0–99)
Triglycerides: 109 mg/dL (ref 0–149)
VLDL Cholesterol Cal: 20 mg/dL (ref 5–40)

## 2023-04-25 LAB — TSH: TSH: 0.625 u[IU]/mL (ref 0.450–4.500)

## 2023-04-27 NOTE — Progress Notes (Signed)
Check HgbA1c at next visit 05/15/2023. Blood sugar elevated. Other labs stable.

## 2023-04-28 ENCOUNTER — Encounter: Payer: Self-pay | Admitting: Podiatry

## 2023-04-28 ENCOUNTER — Ambulatory Visit: Payer: BC Managed Care – PPO | Admitting: Podiatry

## 2023-04-28 DIAGNOSIS — E119 Type 2 diabetes mellitus without complications: Secondary | ICD-10-CM | POA: Diagnosis not present

## 2023-04-28 DIAGNOSIS — M79674 Pain in right toe(s): Secondary | ICD-10-CM | POA: Diagnosis not present

## 2023-04-28 DIAGNOSIS — B351 Tinea unguium: Secondary | ICD-10-CM

## 2023-04-28 DIAGNOSIS — M79675 Pain in left toe(s): Secondary | ICD-10-CM | POA: Diagnosis not present

## 2023-04-28 NOTE — Progress Notes (Signed)
  Subjective:  Patient ID: Melanie Steele, female    DOB: 03/02/1961,   MRN: [id number]  Chief Complaint  Patient presents with   Nail Problem     Routine foot care    62 y.o. female presents for concern of thickened elongated and painful nails that are difficult to trim. Requesting to have them trimmed today. Relates burning and tingling in their feet. Patient is diabetic and last A1c was  Lab Results  Component Value Date   HGBA1C 9.4 (A) 02/04/2022   .   PCP:  Carlean Jews, NP    . Denies any other pedal complaints. Denies n/v/f/c.   Past Medical History:  Diagnosis Date   Allergy    Arthritis    CPAP (continuous positive airway pressure) dependence    Hyperlipidemia    Hypertension    Myocardial infarction [facility])    Sleep apnea    Thyroid disease    Type 2 diabetes mellitus (H[facility])    Vitamin D deficiency     Objective:  Physical Exam: Vascular: DP/PT pulses 2/4 bilateral. CFT <3 seconds. Absent hair growth on digits. Edema noted to bilateral lower extremities. Xerosis noted bilaterally.  Skin. No lacerations or abrasions bilateral feet. Nails 1-5 bilateral  are thickened discolored and elongated with subungual debris.  Musculoskeletal: MMT 5/5 bilateral lower extremities in DF, PF, Inversion and Eversion. Deceased ROM in DF of ankle joint.  Neurological: Sensation intact to light touch. Protective sensation diminished bilateral.    Assessment:   1. Pain due to onychomycosis of toenails of both feet   2. Type 2 diabetes mellitus without complication, without long-term current use of insulin (HCC)       Plan:  Patient was evaluated and treated and all questions answered. -Discussed and educated patient on diabetic foot care, especially with  regards to the vascular, neurological and musculoskeletal systems.  -Stressed the importance of good glycemic control and the detriment of not  controlling glucose levels in relation to the foot. -Discussed supportive  shoes at all times and checking feet regularly.  -Mechanically debrided all nails 1-5 bilateral using sterile nail nipper and filed with dremel without incident  -Answered all patient questions -Patient to return  in 3 months for at risk foot care -Patient advised to call the office if any problems or questions arise in the meantime.   Louann Sjogren, DPM

## 2023-04-30 ENCOUNTER — Ambulatory Visit
Admission: RE | Admit: 2023-04-30 | Discharge: 2023-04-30 | Disposition: A | Discharge status: Home / Self Care | Payer: BC Managed Care – PPO | Source: Ambulatory Visit | Attending: Nurse Practitioner | Admitting: Nurse Practitioner

## 2023-04-30 DIAGNOSIS — Z13820 Encounter for screening for osteoporosis: Secondary | ICD-10-CM

## 2023-04-30 DIAGNOSIS — Z1231 Encounter for screening mammogram for malignant neoplasm of breast: Secondary | ICD-10-CM | POA: Diagnosis not present

## 2023-04-30 DIAGNOSIS — E2839 Other primary ovarian failure: Secondary | ICD-10-CM

## 2023-04-30 NOTE — Progress Notes (Signed)
Normal bone density test  Take daily calcium and vitamin d  Repeat test in 3 to 5 years

## 2023-05-01 ENCOUNTER — Ambulatory Visit: Payer: BC Managed Care – PPO | Admitting: Nurse Practitioner

## 2023-05-07 ENCOUNTER — Other Ambulatory Visit: Payer: Self-pay | Admitting: Cardiology

## 2023-05-07 ENCOUNTER — Other Ambulatory Visit: Payer: Self-pay

## 2023-05-07 DIAGNOSIS — I10 Essential (primary) hypertension: Secondary | ICD-10-CM

## 2023-05-07 MED ORDER — AMLODIPINE BESYLATE 10 MG PO TABS
10.0000 mg | ORAL_TABLET | Freq: Every day | ORAL | 1 refills | Status: DC
Start: 2023-05-07 — End: 2023-11-03

## 2023-05-15 ENCOUNTER — Ambulatory Visit: Payer: BC Managed Care – PPO | Admitting: Nurse Practitioner

## 2023-05-21 ENCOUNTER — Ambulatory Visit (INDEPENDENT_AMBULATORY_CARE_PROVIDER_SITE_OTHER): Payer: BC Managed Care – PPO | Admitting: Nurse Practitioner

## 2023-05-21 ENCOUNTER — Encounter: Payer: Self-pay | Admitting: Nurse Practitioner

## 2023-05-21 VITALS — BP 118/73 | HR 96 | Ht 65.0 in | Wt 215.4 lb

## 2023-05-21 DIAGNOSIS — E785 Hyperlipidemia, unspecified: Secondary | ICD-10-CM

## 2023-05-21 DIAGNOSIS — E1169 Type 2 diabetes mellitus with other specified complication: Secondary | ICD-10-CM | POA: Diagnosis not present

## 2023-05-21 DIAGNOSIS — Z794 Long term (current) use of insulin: Secondary | ICD-10-CM | POA: Diagnosis not present

## 2023-05-21 DIAGNOSIS — I152 Hypertension secondary to endocrine disorders: Secondary | ICD-10-CM

## 2023-05-21 DIAGNOSIS — E1149 Type 2 diabetes mellitus with other diabetic neurological complication: Secondary | ICD-10-CM | POA: Diagnosis not present

## 2023-05-21 DIAGNOSIS — E1159 Type 2 diabetes mellitus with other circulatory complications: Secondary | ICD-10-CM | POA: Diagnosis not present

## 2023-05-21 DIAGNOSIS — Z6835 Body mass index (BMI) 35.0-35.9, adult: Secondary | ICD-10-CM

## 2023-05-21 DIAGNOSIS — E039 Hypothyroidism, unspecified: Secondary | ICD-10-CM

## 2023-05-21 LAB — POCT GLYCOSYLATED HEMOGLOBIN (HGB A1C): HbA1c POC (<> result, manual entry): 9.2 % (ref 4.0–5.6)

## 2023-05-21 NOTE — Progress Notes (Signed)
Reviewed with patient during visit. Normal. Repeat in 2 to 3 years

## 2023-05-21 NOTE — Progress Notes (Signed)
Reviewed with patient during visit. Repeat in 1 year

## 2023-05-21 NOTE — Progress Notes (Signed)
Established patient visit   Patient: Melanie Steele   DOB: 21-Feb-1961   62 y.o. Female  MRN: [id number] Visit Date: 05/21/2023   Chief Complaint  Patient presents with   Medical Management of Chronic Issues   Subjective    HPI  Follow up  -htn  --generally well managed  -DM2 managed per endocrinology  -HgbA1c was checked today and was 9.2 -negative mammogram 04/30/2023 -normal bone density test 04/30/2023 -has been having difficulty sleeping due to increased family stress.  -She denies chest pain, chest pressure, or shortness of breath. She denies headaches or visual disturbances. She denies abdominal pain, nausea, vomiting, or changes in bowel or bladder habits.    Medications: Outpatient Medications Prior to Visit  Medication Sig   amLODipine (NORVASC) 10 MG tablet Take 1 tablet (10 mg total) by mouth daily.   Continuous Blood Gluc Sensor (FREESTYLE LIBRE 2 SENSOR) MISC APPLY AND REPLACE EVERY 14 DAYS   Dulaglutide (TRULICITY) 1.5 MG/0.5ML SOPN Inject 1.5 mg into the skin once a week.   empagliflozin (JARDIANCE) 25 MG TABS tablet Take 1 tablet (25 mg total) by mouth daily.   EPINEPHrine 0.3 mg/0.3 mL IJ SOAJ injection USE AS DIRECTED FOR ALLERGIC REACTION   fexofenadine (ALLEGRA) 180 MG tablet Take 1 tablet by mouth daily.   fluticasone (FLONASE) 50 MCG/ACT nasal spray Place into both nostrils 2 (two) times daily.   gabapentin (NEURONTIN) 100 MG capsule Take 1 capsule (100 mg total) by mouth 3 (three) times daily as needed.   glipiZIDE (GLUCOTROL) 5 MG tablet Take 1 tablet (5 mg total) by mouth 2 (two) times daily before a meal.   glucose blood (ONETOUCH VERIO) test strip Daily   hydrochlorothiazide (HYDRODIURIL) 25 MG tablet TAKE 1 TABLET(25 MG) BY MOUTH DAILY   levothyroxine (SYNTHROID) 150 MCG tablet TAKE 1 TABLET BY MOUTH EVERY DAY BEFORE BREAKFAST(TAKE 2 TABLETS ON SUNDAYS AND 1 TABLET ON ALL OTHER DAYS   metoprolol succinate (TOPROL-XL) 50 MG 24 hr tablet TAKE 1  TABLET(50 MG) BY MOUTH DAILY   Multiple Vitamin (MULTIVITAMIN) tablet Take 1 tablet by mouth daily.   potassium chloride SA (KLOR-CON M) 20 MEQ tablet Take 1 tablet (20 mEq total) by mouth daily.   rosuvastatin (CRESTOR) 10 MG tablet Take 1 tablet (10 mg total) by mouth daily.   Semaglutide,0.25 or 0.5MG /DOS, 2 MG/3ML SOPN Inject 0.5 mg into the skin once a week.   No facility-administered medications prior to visit.    Review of Systems See HPI    Last CBC Lab Results  Component Value Date   WBC 8.2 04/24/2023   HGB 14.8 04/24/2023   HCT 45.3 04/24/2023   MCV 94 04/24/2023   MCH 30.8 04/24/2023   RDW 12.0 04/24/2023   PLT 235 04/24/2023   Last metabolic panel Lab Results  Component Value Date   GLUCOSE 181 (H) 04/24/2023   NA 144 04/24/2023   K 3.6 04/24/2023   CL 102 04/24/2023   CO2 21 04/24/2023   BUN 6 (L) 04/24/2023   CREATININE 0.55 (L) 04/24/2023   EGFR 104 04/24/2023   CALCIUM 9.8 04/24/2023   PROT 7.3 04/24/2023   ALBUMIN 4.5 04/24/2023   LABGLOB 2.8 04/24/2023   AGRATIO 1.6 04/24/2023   BILITOT 1.1 04/24/2023   ALKPHOS 66 04/24/2023   AST 16 04/24/2023   ALT 15 04/24/2023   Last lipids Lab Results  Component Value Date   CHOL 137 04/24/2023   HDL 50 04/24/2023   LDLCALC 67 04/24/2023  TRIG 109 04/24/2023   CHOLHDL 2.7 04/24/2023   Last hemoglobin A1c Lab Results  Component Value Date   HGBA1C 9.2 05/21/2023   Last thyroid functions Lab Results  Component Value Date   TSH 0.625 04/24/2023   T4TOTAL 5.6 04/16/2020   Last vitamin D Lab Results  Component Value Date   VD25OH 33.8 12/23/2022      Objective     Today's Vitals   05/21/23 1113  BP: 118/73  Pulse: 96  SpO2: 96%  Weight: 215 lb 6.4 oz (97.7 kg)  Height: 5\' 5"  (1.651 m)   Body mass index is 35.84 kg/m.  BP Readings from Last 3 Encounters:  05/21/23 118/73  12/30/22 130/66  12/23/22 136/80    Wt Readings from Last 3 Encounters:  05/21/23 215 lb 6.4 oz (97.7  kg)  12/30/22 220 lb (99.8 kg)  12/23/22 219 lb 6.4 oz (99.5 kg)    Physical Exam Vitals and nursing note reviewed.  Constitutional:      Appearance: Normal appearance. She is well-developed.  HENT:     Head: Normocephalic and atraumatic.  Eyes:     Pupils: Pupils are equal, round, and reactive to light.  Neck:     Vascular: No carotid bruit.  Cardiovascular:     Rate and Rhythm: Normal rate and regular rhythm.     Pulses: Normal pulses.     Heart sounds: Normal heart sounds.  Pulmonary:     Effort: Pulmonary effort is normal.     Breath sounds: Normal breath sounds.  Abdominal:     Palpations: Abdomen is soft.  Musculoskeletal:        General: Normal range of motion.     Cervical back: Normal range of motion and neck supple.  Lymphadenopathy:     Cervical: No cervical adenopathy.  Skin:    General: Skin is warm and dry.     Capillary Refill: Capillary refill takes less than 2 seconds.  Neurological:     General: No focal deficit present.     Mental Status: She is alert and oriented to person, place, and time.  Psychiatric:        Mood and Affect: Mood normal.        Behavior: Behavior normal.        Thought Content: Thought content normal.        Judgment: Judgment normal.     Results for orders placed or performed in visit on 05/21/23  POCT glycosylated hemoglobin (Hb A1C)  Result Value Ref Range   Hemoglobin A1C     HbA1c POC (<> result, manual entry) 9.2 4.0 - 5.6 %   HbA1c, POC (prediabetic range)     HbA1c, POC (controlled diabetic range)      Assessment & Plan    Type 2 diabetes mellitus with other neurologic complication, with long-term current use of insulin (HCC) Assessment & Plan: HgbA1c 9.2 today -managed per endocrinology  -encouraged her to limit CHO and excess sugar in the diet.  -monitor closely   Orders: -     POCT glycosylated hemoglobin (Hb A1C)  Hyperlipidemia associated with type 2 diabetes mellitus [facility]) Assessment & Plan: Recent  lipid panel -   Lipid Panel     Component Value Date/Time   CHOL 137 04/24/2023 0820   TRIG 109 04/24/2023 0820   HDL 50 04/24/2023 0820   CHOLHDL 2.7 04/24/2023 0820   CHOLHDL 4 02/04/2022 0942   VLDL 36.2 02/04/2022 0942   LDLCALC 67 04/24/2023 0820  LABVLDL 20 04/24/2023 0820    Continue lipid lowering medication as prescribed. Recheck in 6 to 12 months.    Hypertension associated with type 2 diabetes mellitus (HCC) Assessment & Plan: Blood pressure stable.  Continue current medication.    Acquired hypothyroidism Assessment & Plan: Thyroid panel stable.  Continue current medication.    Class 2 severe obesity due to excess calories with serious comorbidity and body mass index (BMI) of 35.0 to 35.9 in adult [facility]) Assessment & Plan: Discussed lowering calorie intake to 1500 calories per day and incorporating exercise into daily routine to help lose weight.       Return in 6 months (on 11/20/2023) for health maintenance exam.         Carlean Jews, NP  [facility] [phone] (phone) [phone] (fax)  [facility]

## 2023-06-11 NOTE — Assessment & Plan Note (Signed)
Recent lipid panel -   Lipid Panel     Component Value Date/Time   CHOL 137 04/24/2023 0820   TRIG 109 04/24/2023 0820   HDL 50 04/24/2023 0820   CHOLHDL 2.7 04/24/2023 0820   CHOLHDL 4 02/04/2022 0942   VLDL 36.2 02/04/2022 0942   LDLCALC 67 04/24/2023 0820   LABVLDL 20 04/24/2023 0820    Continue lipid lowering medication as prescribed. Recheck in 6 to 12 months.

## 2023-06-11 NOTE — Assessment & Plan Note (Signed)
Blood pressure stable. Continue current medication.  

## 2023-06-11 NOTE — Assessment & Plan Note (Signed)
>>  ASSESSMENT AND PLAN FOR DIABETES MELLITUS (HCC) WRITTEN ON 06/11/2023  9:43 AM BY BOSCIA, HEATHER E, NP  HgbA1c 9.2 today -managed per endocrinology  -encouraged her to limit CHO and excess sugar in the diet.  -monitor closely

## 2023-06-11 NOTE — Assessment & Plan Note (Signed)
>>  ASSESSMENT AND PLAN FOR ACQUIRED HYPOTHYROIDISM WRITTEN ON 06/11/2023  9:39 AM BY BOSCIA, HEATHER E, NP  Thyroid panel stable.  Continue current medication.

## 2023-06-11 NOTE — Assessment & Plan Note (Signed)
HgbA1c 9.2 today -managed per endocrinology  -encouraged her to limit CHO and excess sugar in the diet.  -monitor closely

## 2023-06-11 NOTE — Assessment & Plan Note (Signed)
Discussed lowering calorie intake to 1500 calories per day and incorporating exercise into daily routine to help lose weight.  °

## 2023-06-11 NOTE — Assessment & Plan Note (Signed)
Thyroid panel stable. -.Continue current medication.  

## 2023-07-29 ENCOUNTER — Ambulatory Visit: Payer: BC Managed Care – PPO | Admitting: Podiatry

## 2023-09-03 ENCOUNTER — Telehealth: Payer: Self-pay | Admitting: *Deleted

## 2023-09-03 DIAGNOSIS — I10 Essential (primary) hypertension: Secondary | ICD-10-CM

## 2023-09-03 MED ORDER — HYDROCHLOROTHIAZIDE 25 MG PO TABS
25.0000 mg | ORAL_TABLET | Freq: Every day | ORAL | 0 refills | Status: DC
Start: 2023-09-03 — End: 2023-11-03

## 2023-09-03 NOTE — Telephone Encounter (Signed)
Pt called back to say that insurance only covers 90 day supply

## 2023-09-03 NOTE — Telephone Encounter (Signed)
Pt calling to request a refill on below.    ROV: 11/03/23  hydrochlorothiazide (HYDRODIURIL) 25 MG tablet      [id number] - [place], [place] - [address]  [address],

## 2023-09-03 NOTE — Telephone Encounter (Signed)
90 day sent to [facility]

## 2023-09-22 ENCOUNTER — Ambulatory Visit: Payer: BC Managed Care – PPO | Admitting: Internal Medicine

## 2023-10-08 ENCOUNTER — Other Ambulatory Visit: Payer: Self-pay | Admitting: Nurse Practitioner

## 2023-10-08 DIAGNOSIS — I10 Essential (primary) hypertension: Secondary | ICD-10-CM

## 2023-10-19 ENCOUNTER — Other Ambulatory Visit: Payer: Self-pay

## 2023-10-19 DIAGNOSIS — E785 Hyperlipidemia, unspecified: Secondary | ICD-10-CM

## 2023-10-19 DIAGNOSIS — Z0000 Encounter for general adult medical examination without abnormal findings: Secondary | ICD-10-CM

## 2023-10-19 DIAGNOSIS — E1149 Type 2 diabetes mellitus with other diabetic neurological complication: Secondary | ICD-10-CM

## 2023-10-19 DIAGNOSIS — I10 Essential (primary) hypertension: Secondary | ICD-10-CM

## 2023-10-26 ENCOUNTER — Telehealth: Payer: Self-pay

## 2023-10-26 ENCOUNTER — Other Ambulatory Visit: Payer: BC Managed Care – PPO

## 2023-10-26 DIAGNOSIS — Z0000 Encounter for general adult medical examination without abnormal findings: Secondary | ICD-10-CM | POA: Diagnosis not present

## 2023-10-26 DIAGNOSIS — E1169 Type 2 diabetes mellitus with other specified complication: Secondary | ICD-10-CM

## 2023-10-26 DIAGNOSIS — E1149 Type 2 diabetes mellitus with other diabetic neurological complication: Secondary | ICD-10-CM | POA: Diagnosis not present

## 2023-10-26 DIAGNOSIS — Z794 Long term (current) use of insulin: Secondary | ICD-10-CM | POA: Diagnosis not present

## 2023-10-26 DIAGNOSIS — I10 Essential (primary) hypertension: Secondary | ICD-10-CM

## 2023-10-26 DIAGNOSIS — E785 Hyperlipidemia, unspecified: Secondary | ICD-10-CM | POA: Diagnosis not present

## 2023-10-26 MED ORDER — POTASSIUM CHLORIDE CRYS ER 20 MEQ PO TBCR: 20.0000 meq | EXTENDED_RELEASE_TABLET | Freq: Every day | ORAL | 0 refills | Status: DC

## 2023-10-26 NOTE — Telephone Encounter (Signed)
Refill sent.

## 2023-10-26 NOTE — Telephone Encounter (Signed)
Prescription Request  10/26/2023  LOV: 05/21/23  What is the name of the medication or equipment?   potassium chloride SA (KLOR-CON M) 20 MEQ tablet   Have you contacted your pharmacy to request a refill? Yes   Which pharmacy would you like this sent to?  [id number] - [place], [place] - [address] [place] [address] Phone: [phone] Fax: [phone]  Patient notified that their request is being sent to the clinical staff for review and that they should receive a response within 2 business days.   Please advise at Mobile [phone] (mobile)

## 2023-10-27 LAB — LIPID PANEL
Chol/HDL Ratio: 4.4 ratio (ref 0.0–4.4)
Cholesterol, Total: 264 mg/dL — ABNORMAL HIGH (ref 100–199)
HDL: 60 mg/dL (ref >39)
LDL Chol Calc (NIH): 166 mg/dL — ABNORMAL HIGH (ref 0–99)
Triglycerides: 209 mg/dL — ABNORMAL HIGH (ref 0–149)
VLDL Cholesterol Cal: 38 mg/dL (ref 5–40)

## 2023-10-27 LAB — CBC WITH DIFFERENTIAL/PLATELET
Basophils Absolute: 0 10*3/uL (ref 0.0–0.2)
Basos: 1 %
EOS (ABSOLUTE): 0.1 10*3/uL (ref 0.0–0.4)
Eos: 1 %
Hematocrit: 46.1 % (ref 34.0–46.6)
Hemoglobin: 14.6 g/dL (ref 11.1–15.9)
Immature Grans (Abs): 0 10*3/uL (ref 0.0–0.1)
Immature Granulocytes: 0 %
Lymphocytes Absolute: 2.2 10*3/uL (ref 0.7–3.1)
Lymphs: 27 %
MCH: 30.4 pg (ref 26.6–33.0)
MCHC: 31.7 g/dL (ref 31.5–35.7)
MCV: 96 fL (ref 79–97)
Monocytes Absolute: 0.5 10*3/uL (ref 0.1–0.9)
Monocytes: 6 %
Neutrophils Absolute: 5.3 10*3/uL (ref 1.4–7.0)
Neutrophils: 65 %
Platelets: 248 10*3/uL (ref 150–450)
RBC: 4.8 x10E6/uL (ref 3.77–5.28)
RDW: 12.6 % (ref 11.7–15.4)
WBC: 8.1 10*3/uL (ref 3.4–10.8)

## 2023-10-27 LAB — COMPREHENSIVE METABOLIC PANEL
ALT: 14 [IU]/L (ref 0–32)
AST: 15 [IU]/L (ref 0–40)
Albumin: 4.5 g/dL (ref 3.9–4.9)
Alkaline Phosphatase: 67 [IU]/L (ref 44–121)
BUN/Creatinine Ratio: 23 (ref 12–28)
BUN: 12 mg/dL (ref 8–27)
Bilirubin Total: 0.8 mg/dL (ref 0.0–1.2)
CO2: 22 mmol/L (ref 20–29)
Calcium: 9.8 mg/dL (ref 8.7–10.3)
Chloride: 102 mmol/L (ref 96–106)
Creatinine, Ser: 0.53 mg/dL — ABNORMAL LOW (ref 0.57–1.00)
Globulin, Total: 2.9 g/dL (ref 1.5–4.5)
Glucose: 191 mg/dL — ABNORMAL HIGH (ref 70–99)
Potassium: 3.8 mmol/L (ref 3.5–5.2)
Sodium: 144 mmol/L (ref 134–144)
Total Protein: 7.4 g/dL (ref 6.0–8.5)
eGFR: 105 mL/min/{1.73_m2} (ref >59)

## 2023-10-27 LAB — HEMOGLOBIN A1C
Est. average glucose Bld gHb Est-mCnc: 237 mg/dL
Hgb A1c MFr Bld: 9.9 % — ABNORMAL HIGH (ref 4.8–5.6)

## 2023-11-03 ENCOUNTER — Ambulatory Visit (INDEPENDENT_AMBULATORY_CARE_PROVIDER_SITE_OTHER): Payer: BC Managed Care – PPO | Admitting: Family Medicine

## 2023-11-03 ENCOUNTER — Encounter: Payer: Self-pay | Admitting: Family Medicine

## 2023-11-03 VITALS — BP 124/77 | HR 90 | Resp 18 | Ht 65.0 in | Wt 218.0 lb

## 2023-11-03 DIAGNOSIS — Z0000 Encounter for general adult medical examination without abnormal findings: Secondary | ICD-10-CM

## 2023-11-03 DIAGNOSIS — E890 Postprocedural hypothyroidism: Secondary | ICD-10-CM | POA: Diagnosis not present

## 2023-11-03 DIAGNOSIS — I10 Essential (primary) hypertension: Secondary | ICD-10-CM | POA: Diagnosis not present

## 2023-11-03 DIAGNOSIS — E119 Type 2 diabetes mellitus without complications: Secondary | ICD-10-CM

## 2023-11-03 DIAGNOSIS — Z7984 Long term (current) use of oral hypoglycemic drugs: Secondary | ICD-10-CM

## 2023-11-03 DIAGNOSIS — E782 Mixed hyperlipidemia: Secondary | ICD-10-CM

## 2023-11-03 MED ORDER — AMLODIPINE BESYLATE 10 MG PO TABS: 10.0000 mg | ORAL_TABLET | Freq: Every day | ORAL | 1 refills | Status: DC

## 2023-11-03 MED ORDER — HYDROCHLOROTHIAZIDE 25 MG PO TABS: 25.0000 mg | ORAL_TABLET | Freq: Every day | ORAL | 0 refills | Status: DC

## 2023-11-03 MED ORDER — METOPROLOL SUCCINATE ER 50 MG PO TB24: ORAL_TABLET | ORAL | 3 refills | Status: DC

## 2023-11-03 NOTE — Assessment & Plan Note (Signed)
Followed and managed by endocrinology.  Most recent TSH 0.625 on 04/24/2023.  Continue levothyroxine 150 mcg daily until follow-up with endocrinology on 11/05/2023.

## 2023-11-03 NOTE — Assessment & Plan Note (Signed)
BP goal <130/80.  Stable, at goal.  Continue amlodipine 10 mg daily, hydrochlorothiazide 25 mg daily, metoprolol succinate 50 mg daily.  Most recent CMP within normal limits/fairly stable.  Will continue to monitor.

## 2023-11-03 NOTE — Assessment & Plan Note (Signed)
Last lipid panel: LDL 166, HDL 60, triglycerides 209. The 10-year ASCVD risk score (Arnett DK, et al., 2019) is: 18.5%.  Increase rosuvastatin dose to 20 mg daily, repeat lipid panel and CMP in 2 months.

## 2023-11-03 NOTE — Patient Instructions (Signed)
I really do believe that if the supply issues resolve and you are able to be on a consistent medicine routine, that we will make it much easier to see balanced blood sugars.  In the meantime, keep doing the best you can to take care of yourself and others!    I will also include information to a registered dietitian that has fantastic information online about some of the concept that we talked about with combining carbs with fiber, protein, and healthy fats to keep blood sugars more balanced.  I really love her philosophy of add not subtract and think she has great information. Leane Call [email or web address]  TikTok:b [email or web address] ?lang=[email or web address]  Instagram: [email or web address]  Website: [email or web address] 

## 2023-11-03 NOTE — Progress Notes (Signed)
Complete physical exam  Patient: Melanie Steele   DOB: November 13, 1961   62 y.o. Female  MRN: [id number]  Subjective:    Chief Complaint  Patient presents with   Annual Exam    Melanie Steele is a 62 y.o. female who presents today for a complete physical exam.  She has been under a significant increase in stress after moving her father to an assisted living home due to dementia but dealing with the legal headache as he had not established power of attorney to manage his healthcare or finances.  She has not been able to follow the nutritional plan or get exercise like she would like to.  Most recent fall risk assessment:    05/21/2023   11:18 AM  Fall Risk   Falls in the past year? 0  Number falls in past yr: 0  Injury with Fall? 0  Risk for fall due to : No Fall Risks  Follow up Falls evaluation completed     Most recent depression and anxiety screenings:    11/03/2023    2:36 PM 05/21/2023   11:18 AM  PHQ 2/9 Scores  PHQ - 2 Score 0 2  PHQ- 9 Score 7 8      11/03/2023    2:37 PM 10/31/2022    9:03 AM 06/30/2022    8:39 AM 02/24/2022    8:51 AM  GAD 7 : Generalized Anxiety Score  Nervous, Anxious, on Edge 2 0 0 0  Control/stop worrying 1 0 0 0  Worry too much - different things 1 0 0 0  Trouble relaxing 1 1 1  0  Restless 0 0 0 0  Easily annoyed or irritable 1 1 0 0  Afraid - awful might happen 0 0 0 0  Total GAD 7 Score 6 2 1  0  Anxiety Difficulty Somewhat difficult       Patient Active Problem List   Diagnosis Date Noted   Ovarian failure 11/15/2022   Pulmonary hypertension (HCC) 12/19/2021   Palpitations 11/04/2021   Morbid obesity complicated by dm/ hbp/ hyperlipidemia and OSA 11/04/2021   Allergy to tree nuts 11/03/2021   BMI 36.0-36.9,adult 10/03/2021   Hyperlipidemia 07/08/2019   Sleep apnea 07/08/2019   Essential hypertension 03/02/2019   Type 2 diabetes mellitus without complication, without long-term current use of insulin (HCC) 03/02/2019    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    Past Surgical History:  Procedure Laterality Date   ABDOMINAL HYSTERECTOMY     TONSILLECTOMY     TOOTH EXTRACTION     TRIGGER FINGER RELEASE     Social History   Tobacco Use   Smoking status: Never    Passive exposure: Never   Smokeless tobacco: Never   Tobacco comments:    Never smoked anything  Vaping Use   Vaping status: Never Used  Substance Use Topics   Alcohol use: Yes    Alcohol/week: 1.0 standard drink of alcohol    Types: 1 Standard drinks or equivalent per week    Comment: I dont like wine.I corrected these answers   Drug use: Never   Family History  Problem Relation Age of Onset   Colon polyps Mother    Diabetes Mother    Hypertension Mother    Arthritis Mother    Stroke Mother    Stroke Father    Hypertension Sister    Diabetes Brother    Hypertension Brother    Learning disabilities Son    Obesity Daughter  Colon cancer Neg Hx    Esophageal cancer Neg Hx    Rectal cancer Neg Hx    Stomach cancer Neg Hx    Allergies  Allergen Reactions   Lisinopril Anaphylaxis   Other Anaphylaxis    Tree nuts, grass, seasonal allergies.    Linagliptin Other (See Comments)    Upset stomach   Metformin And Related Diarrhea     Patient Care Team: Melida Quitter, PA as PCP - General (Family Medicine) Thomasene Ripple, DO as PCP - Cardiology (Cardiology)   Outpatient Medications Prior to Visit  Medication Sig   Continuous Blood Gluc Sensor (FREESTYLE LIBRE 2 SENSOR) MISC APPLY AND REPLACE EVERY 14 DAYS   Dulaglutide (TRULICITY) 1.5 MG/0.5ML SOPN Inject 1.5 mg into the skin once a week.   empagliflozin (JARDIANCE) 25 MG TABS tablet Take 1 tablet (25 mg total) by mouth daily.   EPINEPHrine 0.3 mg/0.3 mL IJ SOAJ injection USE AS DIRECTED FOR ALLERGIC REACTION   fexofenadine (ALLEGRA) 180 MG tablet Take 1 tablet by mouth daily.   fluticasone (FLONASE) 50 MCG/ACT nasal spray Place into both nostrils 2 (two) times daily.    gabapentin (NEURONTIN) 100 MG capsule Take 1 capsule (100 mg total) by mouth 3 (three) times daily as needed.   glipiZIDE (GLUCOTROL) 5 MG tablet Take 1 tablet (5 mg total) by mouth 2 (two) times daily before a meal.   glucose blood (ONETOUCH VERIO) test strip Daily   levothyroxine (SYNTHROID) 150 MCG tablet TAKE 1 TABLET BY MOUTH EVERY DAY BEFORE BREAKFAST(TAKE 2 TABLETS ON SUNDAYS AND 1 TABLET ON ALL OTHER DAYS   Multiple Vitamin (MULTIVITAMIN) tablet Take 1 tablet by mouth daily.   potassium chloride SA (KLOR-CON M) 20 MEQ tablet Take 1 tablet (20 mEq total) by mouth daily.   rosuvastatin (CRESTOR) 10 MG tablet Take 1 tablet (10 mg total) by mouth daily.   Semaglutide,0.25 or 0.5MG /DOS, 2 MG/3ML SOPN Inject 0.5 mg into the skin once a week.   [DISCONTINUED] amLODipine (NORVASC) 10 MG tablet Take 1 tablet (10 mg total) by mouth daily.   [DISCONTINUED] hydrochlorothiazide (HYDRODIURIL) 25 MG tablet Take 1 tablet (25 mg total) by mouth daily.   [DISCONTINUED] metoprolol succinate (TOPROL-XL) 50 MG 24 hr tablet TAKE 1 TABLET(50 MG) BY MOUTH DAILY   No facility-administered medications prior to visit.    Review of Systems  Constitutional:  Negative for chills, fever and malaise/fatigue.  HENT:  Negative for congestion and hearing loss.   Eyes:  Negative for blurred vision and double vision.  Respiratory:  Negative for cough and shortness of breath.   Cardiovascular:  Negative for chest pain, palpitations and leg swelling.  Gastrointestinal:  Negative for abdominal pain, constipation, diarrhea and heartburn.  Genitourinary:  Negative for frequency and urgency.  Musculoskeletal:  Negative for myalgias and neck pain.  Neurological:  Negative for headaches.  Endo/Heme/Allergies:  Negative for polydipsia.  Psychiatric/Behavioral:  Negative for depression. The patient is nervous/anxious and has insomnia.       Objective:    BP 124/77 (BP Location: Left Arm, Patient Position: Sitting, Cuff  Size: Normal)   Pulse 90   Resp 18   Ht 5\' 5"  (1.651 m)   Wt 218 lb (98.9 kg)   SpO2 93%   BMI 36.28 kg/m    Physical Exam Constitutional:      General: She is not in acute distress.    Appearance: Normal appearance.  HENT:     Head: Normocephalic and atraumatic.  Right Ear: Tympanic membrane, ear canal and external ear normal. There is no impacted cerumen.     Left Ear: Tympanic membrane, ear canal and external ear normal. There is no impacted cerumen.     Nose: Nose normal.     Mouth/Throat:     Mouth: Mucous membranes are moist.     Pharynx: No oropharyngeal exudate or posterior oropharyngeal erythema.  Eyes:     Extraocular Movements: Extraocular movements intact.     Conjunctiva/sclera: Conjunctivae normal.     Pupils: Pupils are equal, round, and reactive to light.  Neck:     Thyroid: No thyroid mass, thyromegaly or thyroid tenderness.  Cardiovascular:     Rate and Rhythm: Normal rate and regular rhythm.     Heart sounds: Normal heart sounds. No murmur heard.    No friction rub. No gallop.  Pulmonary:     Effort: Pulmonary effort is normal. No respiratory distress.     Breath sounds: Normal breath sounds. No wheezing, rhonchi or rales.  Abdominal:     General: Abdomen is flat. Bowel sounds are normal. There is no distension.     Palpations: There is no mass.     Tenderness: There is no abdominal tenderness. There is no guarding.  Musculoskeletal:        General: Normal range of motion.     Cervical back: Normal range of motion and neck supple.  Lymphadenopathy:     Cervical: No cervical adenopathy.  Skin:    General: Skin is warm and dry.  Neurological:     Mental Status: She is alert and oriented to person, place, and time.     Cranial Nerves: No cranial nerve deficit.     Motor: No weakness.     Deep Tendon Reflexes: Reflexes normal.  Psychiatric:        Mood and Affect: Mood normal.        Assessment & Plan:    Routine Health Maintenance and  Physical Exam  Immunization History  Administered Date(s) Administered   Hepatitis B 08/22/2011, 11/17/2011, 03/05/2012   Hepatitis B, ADULT 11/17/2011, 03/05/2012   Influenza,inj,Quad PF,6+ Mos 10/14/2019, 10/28/2021, 10/31/2022   Influenza-Unspecified 10/24/2016, 10/28/2020   PFIZER(Purple Top)SARS-COV-2 Vaccination 03/08/2020, 04/02/2020, 10/14/2020   PPD Test 09/22/2014   Pneumococcal Polysaccharide-23 06/13/2011   Tdap 11/07/2016   Zoster Recombinant(Shingrix) 11/13/2021, 03/13/2022    Health Maintenance  Topic Date Due   Diabetic kidney evaluation - Urine ACR  10/01/2022   OPHTHALMOLOGY EXAM  03/02/2023   COVID-19 Vaccine (4 - 2023-24 season) 11/19/2023 (Originally 08/16/2023)   INFLUENZA VACCINE  03/14/2024 (Originally 07/16/2023)   HEMOGLOBIN A1C  04/24/2024   Diabetic kidney evaluation - eGFR measurement  10/25/2024   FOOT EXAM  11/02/2024   MAMMOGRAM  04/29/2025   DTaP/Tdap/Td (2 - Td or Tdap) 11/07/2026   Colonoscopy  12/30/2029   Hepatitis C Screening  Completed   HIV Screening  Completed   Zoster Vaccines- Shingrix  Completed   HPV VACCINES  Aged Out    Reviewed most recent labs including CBC, CMP, lipid panel, A1C, TSH, and vitamin D. All within normal limits/stable from last check other than A1c increased to 9.9, creatinine fairly stable at 0.53, LDL significantly increased to 166, triglycerides significantly increased to 209. Unable to leave urine microalbumin today.  Will need to collect at next appointment.  Discussed health benefits of physical activity, and encouraged her to engage in regular exercise appropriate for her age and condition.  Wellness examination  Type  2 diabetes mellitus without complication, without long-term current use of insulin (HCC) Assessment & Plan: Followed and managed by endocrinology.  Most recent A1c 9.9.  Upcoming appointment with endocrinology on 11/05/2023, will need to adjust medication routine.  Will continue to monitor and  coordination with endocrinology.   Essential hypertension Assessment & Plan: BP goal <130/80.  Stable, at goal.  Continue amlodipine 10 mg daily, hydrochlorothiazide 25 mg daily, metoprolol succinate 50 mg daily.  Most recent CMP within normal limits/fairly stable.  Will continue to monitor.  Orders: -     amLODIPine Besylate; Take 1 tablet (10 mg total) by mouth daily.  Dispense: 90 tablet; Refill: 1 -     hydroCHLOROthiazide; Take 1 tablet (25 mg total) by mouth daily.  Dispense: 90 tablet; Refill: 0 -     Metoprolol Succinate ER; TAKE 1 TABLET(50 MG) BY MOUTH DAILY  Dispense: 90 tablet; Refill: 3  Postablative hypothyroidism Assessment & Plan: Followed and managed by endocrinology.  Most recent TSH 0.625 on 04/24/2023.  Continue levothyroxine 150 mcg daily until follow-up with endocrinology on 11/05/2023.   Mixed hyperlipidemia Assessment & Plan: Last lipid panel: LDL 166, HDL 60, triglycerides 209. The 10-year ASCVD risk score (Arnett DK, et al., 2019) is: 18.5%.  Increase rosuvastatin dose to 20 mg daily, repeat lipid panel and CMP in 2 months.   Encourage patient to do the best that she can during this stressful time.  Provided information for registered dietitian that talks about combining carbs with protein, fiber, and healthy fats to balance blood sugars.  Return in about 6 months (around 05/02/2024) for follow-up for HTN, HLD, DM, fasting blood work 1 week before.     Melida Quitter, PA

## 2023-11-03 NOTE — Assessment & Plan Note (Signed)
Followed and managed by endocrinology.  Most recent A1c 9.9.  Upcoming appointment with endocrinology on 11/05/2023, will need to adjust medication routine.  Will continue to monitor and coordination with endocrinology.

## 2023-11-05 ENCOUNTER — Ambulatory Visit: Payer: BC Managed Care – PPO | Admitting: Internal Medicine

## 2023-11-05 ENCOUNTER — Encounter: Payer: Self-pay | Admitting: Internal Medicine

## 2023-11-05 ENCOUNTER — Ambulatory Visit: Payer: BC Managed Care – PPO

## 2023-11-05 DIAGNOSIS — Z23 Encounter for immunization: Secondary | ICD-10-CM

## 2023-11-05 NOTE — Progress Notes (Signed)
Patient is here for her Flu shot pt tolerated the injection

## 2023-11-05 NOTE — Progress Notes (Deleted)
Name: Melanie Steele  Age/ Sex: 62 y.o., female   MRN/ DOB: [id number], 1961/02/22     PCP: Melida Quitter, PA   Reason for Endocrinology Evaluation: Type 2 Diabetes Mellitus  Initial Endocrine Consultative Visit: 08/21/2021    PATIENT IDENTIFIER: Melanie Steele is a 61 y.o. female with a past medical history of T2DM, OSA, HTN and Dyslipidemia. The patient has followed with Endocrinology clinic since 08/21/2020 for consultative assistance with management of her diabetes.  DIABETIC HISTORY:  Melanie Steele was diagnosed with DM many years ago. Metformin-  Tearing stomach   Her hemoglobin A1c has ranged from 7.9% in 2020, peaking at 9.8% in 2021.  On her initial visit to our clinic she had an A1c 9.8%, she was on Invokana and Trulicity, we started Glipizide      THYROID HISTORY: Has been diagnosed with hyperthyroidism in 1993 secondary to Graves' disease . She is S/P RAI ablation, followed by LT- 4 replacement. She was on levothyroxine initially and switched to armour a few years ago ( ~ 2019) . She has been having issues with adjusting her levels.  By 08/2021 we switched from Armour to Levothyroxine      Father with thyroid disease    SUBJECTIVE:   During the last visit (12/11/2023): A1c 9.3%   Today (11/05/2023): Melanie Steele is here for a follow up on diabetes and hypothyroidism.  She has NOT been to our clinic in 11 months. She checks her blood sugars multiple times daily through CGM . The patient has  had hypoglycemic episodes since the last clinic visit, which she attributes to not eating any carbs one day.    Denies nausea , vomiting or  diarrhea  Saw pulmonary 12/2021 for pulmonary hTN ,and was referred to sleep medicine  Had EKG showing mild MI  Daughter with CVA at age 72  She has been having issues obtaining the Trulicity due to Shortage      HOME ENDOCRINE  REGIMEN:  Glipizide 5 mg ,1 tablet before Breakfast and 1 tablet before supper  Jardiance 25 mg ,1  tablet in the morning Trulicity 1.5 mg weekly  Levothyroxine 150 mcg daily  Rosuvastatin 10 mg daily      Statin: yes ACE-I/ARB: no    CONTINUOUS GLUCOSE MONITORING RECORD INTERPRETATION    Dates of Recording: incorrect dates    2/21 /2023 6:39 am 125 8:46 am 208 9:20 am 177       DIABETIC COMPLICATIONS: Microvascular complications:   Denies: CKD, retinopathy, neuropathy  Last Eye Exam: Completed 09/14/2020  Macrovascular complications:   Denies: CAD, CVA, PVD   HISTORY:  Past Medical History:  Past Medical History:  Diagnosis Date   Allergy    Anxiety 04/2023   Family   Arthritis    CPAP (continuous positive airway pressure) dependence    Hyperlipidemia    Hypertension    Myocardial infarction (HCC)    Neuromuscular disorder (HCC) 2017   Trigger fingers   Oxygen deficiency 2009   CPAP   Sleep apnea    Thyroid disease    Type 2 diabetes mellitus (HCC)    Vitamin D deficiency    Past Surgical History:  Past Surgical History:  Procedure Laterality Date   ABDOMINAL HYSTERECTOMY     TONSILLECTOMY     TOOTH EXTRACTION     TRIGGER FINGER RELEASE     Social History:  reports that she has never smoked. She has never been exposed to tobacco smoke. She has  never used smokeless tobacco. She reports current alcohol use of about 1.0 standard drink of alcohol per week. She reports that she does not use drugs. Family History:  Family History  Problem Relation Age of Onset   Colon polyps Mother    Diabetes Mother    Hypertension Mother    Arthritis Mother    Stroke Mother    Stroke Father    Hypertension Sister    Diabetes Brother    Hypertension Brother    Learning disabilities Son    Obesity Daughter    Colon cancer Neg Hx    Esophageal cancer Neg Hx    Rectal cancer Neg Hx    Stomach cancer Neg Hx      HOME MEDICATIONS: Allergies as of 11/05/2023       Reactions   Lisinopril Anaphylaxis   Other Anaphylaxis   Tree nuts, grass, seasonal  allergies.    Linagliptin Other (See Comments)   Upset stomach   Metformin And Related Diarrhea        Medication List        Accurate as of November 05, 2023  7:07 AM. If you have any questions, ask your nurse or doctor.          amLODipine 10 MG tablet Commonly known as: NORVASC Take 1 tablet (10 mg total) by mouth daily.   empagliflozin 25 MG Tabs tablet Commonly known as: Jardiance Take 1 tablet (25 mg total) by mouth daily.   EPINEPHrine 0.3 mg/0.3 mL Soaj injection Commonly known as: EPI-PEN USE AS DIRECTED FOR ALLERGIC REACTION   fexofenadine 180 MG tablet Commonly known as: ALLEGRA Take 1 tablet by mouth daily.   fluticasone 50 MCG/ACT nasal spray Commonly known as: FLONASE Place into both nostrils 2 (two) times daily.   FreeStyle Libre 2 Sensor Misc APPLY AND REPLACE EVERY 14 DAYS   gabapentin 100 MG capsule Commonly known as: NEURONTIN Take 1 capsule (100 mg total) by mouth 3 (three) times daily as needed.   glipiZIDE 5 MG tablet Commonly known as: GLUCOTROL Take 1 tablet (5 mg total) by mouth 2 (two) times daily before a meal.   hydrochlorothiazide 25 MG tablet Commonly known as: HYDRODIURIL Take 1 tablet (25 mg total) by mouth daily.   levothyroxine 150 MCG tablet Commonly known as: SYNTHROID TAKE 1 TABLET BY MOUTH EVERY DAY BEFORE BREAKFAST(TAKE 2 TABLETS ON SUNDAYS AND 1 TABLET ON ALL OTHER DAYS   metoprolol succinate 50 MG 24 hr tablet Commonly known as: TOPROL-XL TAKE 1 TABLET(50 MG) BY MOUTH DAILY   multivitamin tablet Take 1 tablet by mouth daily.   OneTouch Verio test strip Generic drug: glucose blood Daily   potassium chloride SA 20 MEQ tablet Commonly known as: KLOR-CON M Take 1 tablet (20 mEq total) by mouth daily.   rosuvastatin 10 MG tablet Commonly known as: Crestor Take 1 tablet (10 mg total) by mouth daily.   Semaglutide(0.25 or 0.5MG /DOS) 2 MG/3ML Sopn Inject 0.5 mg into the skin once a week.   Trulicity 1.5  MG/0.5ML Soaj Generic drug: Dulaglutide Inject 1.5 mg into the skin once a week.         OBJECTIVE:   Vital Signs: There were no vitals taken for this visit.  Wt Readings from Last 3 Encounters:  11/03/23 218 lb (98.9 kg)  05/21/23 215 lb 6.4 oz (97.7 kg)  12/30/22 220 lb (99.8 kg)     Exam: General: Pt appears well and is in NAD  Lungs: Clear with  good BS bilat with no rales, rhonchi, or wheezes  Heart: RRR with normal S1 and S2 and no gallops; no murmurs; no rub  Abdomen: Normoactive bowel sounds, soft, nontender, without masses or organomegaly palpable  Extremities: No pretibial edema.   Neuro: MS is good with appropriate affect, pt is alert and Ox3    DM foot exam: 05/28/2021   The skin of the feet is intact without sores or ulcerations. The pedal pulses are 2+ on right and 2+ on left. The sensation is intact to a screening 5.07, 10 gram monofilament bilaterally    DATA REVIEWED:  Lab Results  Component Value Date   HGBA1C 9.9 (H) 10/26/2023   HGBA1C 9.2 05/21/2023   HGBA1C 9.4 (A) 02/04/2022    Latest Reference Range & Units 10/26/23 10:09  Sodium 134 - 144 mmol/L 144  Potassium 3.5 - 5.2 mmol/L 3.8  Chloride 96 - 106 mmol/L 102  CO2 20 - 29 mmol/L 22  Glucose 70 - 99 mg/dL 469 (H)  BUN 8 - 27 mg/dL 12  Creatinine 6.29 - 5.28 mg/dL 4.13 (L)  Calcium 8.7 - 10.3 mg/dL 9.8  BUN/Creatinine Ratio 12 - 28  23  eGFR >59 mL/min/1.73 105  Alkaline Phosphatase 44 - 121 IU/L 67  Albumin 3.9 - 4.9 g/dL 4.5  AST 0 - 40 IU/L 15  ALT 0 - 32 IU/L 14  Total Protein 6.0 - 8.5 g/dL 7.4  Total Bilirubin 0.0 - 1.2 mg/dL 0.8  Total CHOL/HDL Ratio 0.0 - 4.4 ratio 4.4  Cholesterol, Total 100 - 199 mg/dL 244 (H)  HDL Cholesterol >39 mg/dL 60  Triglycerides 0 - 010 mg/dL 272 (H)  VLDL Cholesterol Cal 5 - 40 mg/dL 38  LDL Chol Calc (NIH) 0 - 99 mg/dL 536 (H)    Latest Reference Range & Units 04/24/23 08:20  TSH 0.450 - 4.500 uIU/mL 0.625     ASSESSMENT / PLAN /  RECOMMENDATIONS:   1) Type 2 Diabetes Mellitus, Poorly controlled, Without complications - Most recent A1c of 9.9 %. Goal A1c < 7.0 %.   - A1c stable but continues to be above goal, despite escalating glycemic agents  - Will increase Glipizide as below  - Will switch trulicity to Ozempic as she is having a difficult time obtaining it   MEDICATIONS: - Increase Glipizide 5 mg , 1 tablet before breakfast and 1 tablet before Supper  - Continue Jardiance 25 mg daily  - Will switch Trulicity 3 mg weekly to Ozempic 2 mg weekly     EDUCATION / INSTRUCTIONS: BG monitoring instructions: Patient is instructed to check her blood sugars 3 times a day, before meals  Call [facility] if: BG persistently < 70  I reviewed the Rule of 15 for the treatment of hypoglycemia in detail with the patient. Literature supplied.    2) Diabetic complications:  Eye: Does not have known diabetic retinopathy.  Neuro/ Feet: Does not have known diabetic peripheral neuropathy .  Renal: Patient does not have known baseline CKD. She   is not  on an ACEI/ARB at present.     3) Postablative Hypothyroidism:   - No local neck symptoms  -- She is taking it appropriately   - TSH is normal    Medication:    Levothyroxine 150 mcg, 1 tablet daily    4) Dyslipidemia:   -Her LDL has increased, suspect imperfect adherence to statin therapy   Medication Continue rosuvastatin 10 mg daily   F/U in 4 months  Signed electronically by: Lyndle Herrlich, MD  [facility]  [facility] [address] [place] [place], [place] [address] Phone: [phone] FAX: [phone]   CC: Melida Quitter, PA [address] [place] [address] Phone: [phone]  Fax: [phone]  Return to Endocrinology clinic as below: Future Appointments  Date Time Provider Department Center  11/05/2023  9:10 AM Corderro Koloski, Konrad Dolores, MD [facility] None   11/05/2023  2:30 PM [facility] None  12/28/2023  9:00 AM Tobb, Kardie, DO [facility] None  04/27/2024  8:45 AM [facility] None  05/02/2024  3:10 PM Melida Quitter, PA [facility] None

## 2023-11-06 ENCOUNTER — Ambulatory Visit: Payer: BC Managed Care – PPO

## 2023-11-09 ENCOUNTER — Ambulatory Visit (INDEPENDENT_AMBULATORY_CARE_PROVIDER_SITE_OTHER): Payer: BC Managed Care – PPO | Admitting: Internal Medicine

## 2023-11-09 ENCOUNTER — Encounter: Payer: Self-pay | Admitting: Internal Medicine

## 2023-11-09 VITALS — BP 130/80 | HR 72 | Ht 65.0 in | Wt 215.8 lb

## 2023-11-09 DIAGNOSIS — E785 Hyperlipidemia, unspecified: Secondary | ICD-10-CM

## 2023-11-09 DIAGNOSIS — Z7985 Long-term (current) use of injectable non-insulin antidiabetic drugs: Secondary | ICD-10-CM | POA: Diagnosis not present

## 2023-11-09 DIAGNOSIS — Z7984 Long term (current) use of oral hypoglycemic drugs: Secondary | ICD-10-CM | POA: Diagnosis not present

## 2023-11-09 DIAGNOSIS — E039 Hypothyroidism, unspecified: Secondary | ICD-10-CM

## 2023-11-09 DIAGNOSIS — E1165 Type 2 diabetes mellitus with hyperglycemia: Secondary | ICD-10-CM

## 2023-11-09 MED ORDER — SEMAGLUTIDE (1 MG/DOSE) 4 MG/3ML ~~LOC~~ SOPN: 1.0000 mg | PEN_INJECTOR | SUBCUTANEOUS | 3 refills | Status: DC

## 2023-11-09 MED ORDER — ROSUVASTATIN CALCIUM 20 MG PO TABS: 20.0000 mg | ORAL_TABLET | Freq: Every day | ORAL | 3 refills | Status: DC

## 2023-11-09 MED ORDER — GLIPIZIDE 10 MG PO TABS: 10.0000 mg | ORAL_TABLET | Freq: Two times a day (BID) | ORAL | 3 refills | Status: DC

## 2023-11-09 MED ORDER — EMPAGLIFLOZIN 25 MG PO TABS: 25.0000 mg | ORAL_TABLET | Freq: Every day | ORAL | 3 refills | Status: DC

## 2023-11-09 NOTE — Patient Instructions (Signed)
-   Increase Glipizide 10 mg ,1 tablet before Breakfast and  1 tablet before Supper  - Continue Jardiance 25 mg weekly  - Increase Ozempic 1 mg once weekly   Increase Rosuvastatin 20 mg at bedtime      HOW TO TREAT LOW BLOOD SUGARS (Blood sugar LESS THAN 70 MG/DL) Please follow the RULE OF 15 for the treatment of hypoglycemia treatment (when your (blood sugars are less than 70 mg/dL)   STEP 1: Take 15 grams of carbohydrates when your blood sugar is low, which includes:  3-4 GLUCOSE TABS  OR 3-4 OZ OF JUICE OR REGULAR SODA OR ONE TUBE OF GLUCOSE GEL    STEP 2: RECHECK blood sugar in 15 MINUTES STEP 3: If your blood sugar is still low at the 15 minute recheck --> then, go back to STEP 1 and treat AGAIN with another 15 grams of carbohydrates.

## 2023-11-09 NOTE — Progress Notes (Signed)
Name: Melanie Steele  Age/ Sex: 62 y.o., female   MRN/ DOB: [id number], 1961/04/09     PCP: Melida Quitter, PA   Reason for Endocrinology Evaluation: Type 2 Diabetes Mellitus  Initial Endocrine Consultative Visit: 08/21/2021    PATIENT IDENTIFIER: Melanie Steele is a 62 y.o. female with a past medical history of T2DM, OSA, HTN and Dyslipidemia. The patient has followed with Endocrinology clinic since 08/21/2020 for consultative assistance with management of her diabetes.  DIABETIC HISTORY:  Ms. Musich was diagnosed with DM many years ago. Metformin-  Tearing stomach   Her hemoglobin A1c has ranged from 7.9% in 2020, peaking at 9.8% in 2021.  On her initial visit to our clinic she had an A1c 9.8%, she was on Invokana and Trulicity, we started Glipizide      THYROID HISTORY: Has been diagnosed with hyperthyroidism in 1993 secondary to Graves' disease . She is S/P RAI ablation, followed by LT- 4 replacement. She was on levothyroxine initially and switched to armour a few years ago ( ~ 2019) . She has been having issues with adjusting her levels.  By 08/2021 we switched from Armour to Levothyroxine      Father with thyroid disease    SUBJECTIVE:   During the last visit (12/11/2023): A1c 9.3%   Today (11/09/2023): Melanie Steele is here for a follow up on diabetes and hypothyroidism.  She has NOT been to our clinic in 11 months. She checks her blood sugars multiple times daily through CGM . The patient has  /not had hypoglycemic episodes since the last clinic visit.    Denies nausea , vomiting Denies constipation  or diarrhea  Had transient constipation a few months ago , but that has resolved     HOME ENDOCRINE  REGIMEN:  Glipizide 5 mg ,1 tablet before Breakfast and 1 tablet before supper  Jardiance 25 mg ,1 tablet in the morning Ozempic 0.5 mg weekly  Levothyroxine 150 mcg daily  Rosuvastatin 10 mg daily      Statin: yes ACE-I/ARB: no    CONTINUOUS GLUCOSE  MONITORING RECORD INTERPRETATION    Dates of Recording: 11/12 - 11/09/2023  Sensor description: [employer]  Results statistics:   CGM use % of time 83  Average and SD 195/20.7  Time in range     43   %  % Time Above 180 46  % Time above 250 11  % Time Below target 0   Glycemic patterns summary: BG's trend down overnight and fluctuate throughout the day  Hyperglycemic episodes postprandial  Hypoglycemic episodes occurred N/A  Overnight periods: Upper limit of optimal       DIABETIC COMPLICATIONS: Microvascular complications:   Denies: CKD, retinopathy, neuropathy  Last Eye Exam: Completed 09/14/2020  Macrovascular complications:   Denies: CAD, CVA, PVD   HISTORY:  Past Medical History:  Past Medical History:  Diagnosis Date   Allergy    Anxiety 04/2023   Family   Arthritis    CPAP (continuous positive airway pressure) dependence    Hyperlipidemia    Hypertension    Myocardial infarction (HCC)    Neuromuscular disorder (HCC) 2017   Trigger fingers   Oxygen deficiency 2009   CPAP   Sleep apnea    Thyroid disease    Type 2 diabetes mellitus (HCC)    Vitamin D deficiency    Past Surgical History:  Past Surgical History:  Procedure Laterality Date   ABDOMINAL HYSTERECTOMY     TONSILLECTOMY  TOOTH EXTRACTION     TRIGGER FINGER RELEASE     Social History:  reports that she has never smoked. She has never been exposed to tobacco smoke. She has never used smokeless tobacco. She reports current alcohol use of about 1.0 standard drink of alcohol per week. She reports that she does not use drugs. Family History:  Family History  Problem Relation Age of Onset   Colon polyps Mother    Diabetes Mother    Hypertension Mother    Arthritis Mother    Stroke Mother    Stroke Father    Hypertension Sister    Diabetes Brother    Hypertension Brother    Learning disabilities Son    Obesity Daughter    Colon cancer Neg Hx    Esophageal cancer Neg Hx     Rectal cancer Neg Hx    Stomach cancer Neg Hx      HOME MEDICATIONS: Allergies as of 11/09/2023       Reactions   Lisinopril Anaphylaxis   Other Anaphylaxis   Tree nuts, grass, seasonal allergies.    Linagliptin Other (See Comments)   Upset stomach   Metformin And Related Diarrhea        Medication List        Accurate as of November 09, 2023  9:35 AM. If you have any questions, ask your nurse or doctor.          amLODipine 10 MG tablet Commonly known as: NORVASC Take 1 tablet (10 mg total) by mouth daily.   empagliflozin 25 MG Tabs tablet Commonly known as: Jardiance Take 1 tablet (25 mg total) by mouth daily.   EPINEPHrine 0.3 mg/0.3 mL Soaj injection Commonly known as: EPI-PEN USE AS DIRECTED FOR ALLERGIC REACTION   fexofenadine 180 MG tablet Commonly known as: ALLEGRA Take 1 tablet by mouth daily.   fluticasone 50 MCG/ACT nasal spray Commonly known as: FLONASE Place into both nostrils 2 (two) times daily.   FreeStyle Libre 2 Sensor Misc APPLY AND REPLACE EVERY 14 DAYS   gabapentin 100 MG capsule Commonly known as: NEURONTIN Take 1 capsule (100 mg total) by mouth 3 (three) times daily as needed.   glipiZIDE 5 MG tablet Commonly known as: GLUCOTROL Take 1 tablet (5 mg total) by mouth 2 (two) times daily before a meal.   hydrochlorothiazide 25 MG tablet Commonly known as: HYDRODIURIL Take 1 tablet (25 mg total) by mouth daily.   levothyroxine 150 MCG tablet Commonly known as: SYNTHROID TAKE 1 TABLET BY MOUTH EVERY DAY BEFORE BREAKFAST(TAKE 2 TABLETS ON SUNDAYS AND 1 TABLET ON ALL OTHER DAYS   metoprolol succinate 50 MG 24 hr tablet Commonly known as: TOPROL-XL TAKE 1 TABLET(50 MG) BY MOUTH DAILY   multivitamin tablet Take 1 tablet by mouth daily.   OneTouch Verio test strip Generic drug: glucose blood Daily   potassium chloride SA 20 MEQ tablet Commonly known as: KLOR-CON M Take 1 tablet (20 mEq total) by mouth daily.    rosuvastatin 10 MG tablet Commonly known as: Crestor Take 1 tablet (10 mg total) by mouth daily.   Semaglutide(0.25 or 0.5MG /DOS) 2 MG/3ML Sopn Inject 0.5 mg into the skin once a week.   Trulicity 1.5 MG/0.5ML Soaj Generic drug: Dulaglutide Inject 1.5 mg into the skin once a week.         OBJECTIVE:   Vital Signs: BP 130/80 (BP Location: Left Arm, Patient Position: Sitting, Cuff Size: Large)   Pulse 72   Ht 5'  5" (1.651 m)   Wt 215 lb 12.8 oz (97.9 kg)   SpO2 96%   BMI 35.91 kg/m   Wt Readings from Last 3 Encounters:  11/09/23 215 lb 12.8 oz (97.9 kg)  11/03/23 218 lb (98.9 kg)  05/21/23 215 lb 6.4 oz (97.7 kg)     Exam: General: Pt appears well and is in NAD  Lungs: Clear with good BS bilat   Heart: RRR   Abdomen: Soft, nontender,   Extremities: No pretibial edema.   Neuro: MS is good with appropriate affect, pt is alert and Ox3    DM foot exam:11/09/2023   The skin of the feet is intact without sores or ulcerations. The pedal pulses are 2+ on right and 2+ on left. The sensation is intact to a screening 5.07, 10 gram monofilament bilaterally    DATA REVIEWED:  Lab Results  Component Value Date   HGBA1C 9.9 (H) 10/26/2023   HGBA1C 9.2 05/21/2023   HGBA1C 9.4 (A) 02/04/2022    Latest Reference Range & Units 10/26/23 10:09  Sodium 134 - 144 mmol/L 144  Potassium 3.5 - 5.2 mmol/L 3.8  Chloride 96 - 106 mmol/L 102  CO2 20 - 29 mmol/L 22  Glucose 70 - 99 mg/dL 657 (H)  BUN 8 - 27 mg/dL 12  Creatinine 8.46 - 9.62 mg/dL 9.52 (L)  Calcium 8.7 - 10.3 mg/dL 9.8  BUN/Creatinine Ratio 12 - 28  23  eGFR >59 mL/min/1.73 105  Alkaline Phosphatase 44 - 121 IU/L 67  Albumin 3.9 - 4.9 g/dL 4.5  AST 0 - 40 IU/L 15  ALT 0 - 32 IU/L 14  Total Protein 6.0 - 8.5 g/dL 7.4  Total Bilirubin 0.0 - 1.2 mg/dL 0.8  Total CHOL/HDL Ratio 0.0 - 4.4 ratio 4.4  Cholesterol, Total 100 - 199 mg/dL 841 (H)  HDL Cholesterol >39 mg/dL 60  Triglycerides 0 - 324 mg/dL 401 (H)   VLDL Cholesterol Cal 5 - 40 mg/dL 38  LDL Chol Calc (NIH) 0 - 99 mg/dL 027 (H)    Latest Reference Range & Units 04/24/23 08:20  TSH 0.450 - 4.500 uIU/mL 0.625     ASSESSMENT / PLAN / RECOMMENDATIONS:   1) Type 2 Diabetes Mellitus, Poorly controlled, Without complications - Most recent A1c of 9.9 %. Goal A1c < 7.0 %.   -Patient to continues with worsening glycemic control -She does drink light juice and sweet tea, she also tends to snack at night.  Discussed options for low-carb snacking and she was again encouraged to avoid all sugar sweetened beverages -I will increase her glipizide -I will increase Ozempic as below -Patient to contact our office with hypoglycemia, so we would adjust her glipizide    MEDICATIONS: - Increase Glipizide 10 mg , 1 tablet before breakfast and 1 tablet before Supper  - Continue Jardiance 25 mg daily  -Increase Ozempic 1 mg weekly     EDUCATION / INSTRUCTIONS: BG monitoring instructions: Patient is instructed to check her blood sugars 3 times a day, before meals  Call [facility] if: BG persistently < 70  I reviewed the Rule of 15 for the treatment of hypoglycemia in detail with the patient. Literature supplied.    2) Diabetic complications:  Eye: Does not have known diabetic retinopathy.  Neuro/ Feet: Does not have known diabetic peripheral neuropathy .  Renal: Patient does not have known baseline CKD. She   is not  on an ACEI/ARB at present.     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 [place] [place], [place] [address] Phone: [phone] FAX: [phone]   CC: Melida Quitter, PA [address] [place] [address] Phone: [phone]  Fax: [phone]  Return to Endocrinology clinic as below: Future Appointments  Date Time Provider Department Center  12/28/2023  9:00 AM Tobb, Kardie, DO [facility] None  04/27/2024  8:45 AM [facility] None  05/02/2024  3:10 PM Melida Quitter, PA [facility] None

## 2023-11-10 ENCOUNTER — Other Ambulatory Visit: Payer: Self-pay | Admitting: Internal Medicine

## 2023-11-10 ENCOUNTER — Encounter: Payer: Self-pay | Admitting: Internal Medicine

## 2023-12-28 ENCOUNTER — Ambulatory Visit: Payer: BC Managed Care – PPO | Attending: Cardiology | Admitting: Cardiology

## 2023-12-28 ENCOUNTER — Encounter: Payer: Self-pay | Admitting: Cardiology

## 2023-12-28 VITALS — BP 132/70 | HR 91 | Ht 65.0 in | Wt 214.0 lb

## 2023-12-28 DIAGNOSIS — E785 Hyperlipidemia, unspecified: Secondary | ICD-10-CM

## 2023-12-28 DIAGNOSIS — G4733 Obstructive sleep apnea (adult) (pediatric): Secondary | ICD-10-CM | POA: Diagnosis not present

## 2023-12-28 DIAGNOSIS — I10 Essential (primary) hypertension: Secondary | ICD-10-CM

## 2023-12-28 NOTE — Progress Notes (Signed)
 Cardiology Office Note:    Date:  12/30/2023   ID:  Melanie Steele, DOB 1961/05/16, MRN [id number]  PCP:  Wallace Joesph LABOR, PA  Cardiologist:  Dub Huntsman, DO  Electrophysiologist:  None   Referring MD: Hanford Powell BRAVO, NP    I am experiencing chest pain  History of Present Illness:    Melanie Steele is a 63 y.o. female with a hx of mild coronary artery disease seen on coronary CT scan back in December 2022, type 2 diabetes, sleep apnea on CPAP, hypertension, obesity, hypothyroidism  The patient also reports a recent increase in her cholesterol levels, despite being on Crestor . She expresses surprise and concern about this development. Additionally, she mentions issues with sleep, stating that she rarely achieves an average sleep score according to her sleep tracking app. She attributes this to her sleep apnea and snoring, and expresses frustration with her CPAP machine.   Past Medical History:  Diagnosis Date   Allergy    Anxiety 04/2023   Family   Arthritis    CPAP (continuous positive airway pressure) dependence    Hyperlipidemia    Hypertension    Myocardial infarction [facility])    Neuromuscular disorder (HCC) 2017   Trigger fingers   Oxygen deficiency 2009   CPAP   Sleep apnea    Thyroid  disease    Type 2 diabetes mellitus (HCC)    Vitamin D  deficiency     Past Surgical History:  Procedure Laterality Date   ABDOMINAL HYSTERECTOMY     TONSILLECTOMY     TOOTH EXTRACTION     TRIGGER FINGER RELEASE      Current Medications: Current Meds  Medication Sig   amLODipine  (NORVASC ) 10 MG tablet Take 1 tablet (10 mg total) by mouth daily.   Continuous Blood Gluc Sensor (FREESTYLE LIBRE 2 SENSOR) MISC APPLY AND REPLACE EVERY 14 DAYS   empagliflozin  (JARDIANCE ) 25 MG TABS tablet Take 1 tablet (25 mg total) by mouth daily.   EPINEPHrine  0.3 mg/0.3 mL IJ SOAJ injection USE AS DIRECTED FOR ALLERGIC REACTION   fexofenadine (ALLEGRA) 180 MG tablet Take 1 tablet by mouth  daily.   fluticasone (FLONASE) 50 MCG/ACT nasal spray Place into both nostrils 2 (two) times daily.   gabapentin  (NEURONTIN ) 100 MG capsule Take 1 capsule (100 mg total) by mouth 3 (three) times daily as needed.   glipiZIDE  (GLUCOTROL ) 10 MG tablet Take 1 tablet (10 mg total) by mouth 2 (two) times daily before a meal.   glucose blood (ONETOUCH VERIO) test strip Daily   hydrochlorothiazide  (HYDRODIURIL ) 25 MG tablet Take 1 tablet (25 mg total) by mouth daily.   levothyroxine  (SYNTHROID ) 150 MCG tablet Take 1 tablet (150 mcg total) by mouth daily before breakfast.   metoprolol  succinate (TOPROL -XL) 50 MG 24 hr tablet TAKE 1 TABLET(50 MG) BY MOUTH DAILY   Multiple Vitamin (MULTIVITAMIN) tablet Take 1 tablet by mouth daily.   potassium chloride  SA (KLOR-CON  M) 20 MEQ tablet Take 1 tablet (20 mEq total) by mouth daily.   rosuvastatin  (CRESTOR ) 20 MG tablet Take 1 tablet (20 mg total) by mouth daily.   Semaglutide , 1 MG/DOSE, 4 MG/3ML SOPN Inject 1 mg as directed once a week.     Allergies:   Lisinopril, Other, Linagliptin, and Metformin and related   Social History   Socioeconomic History   Marital status: Widowed    Spouse name: Not on file   Number of children: Not on file   Years of education: Not on  file   Highest education level: Bachelor's degree (e.g., BA, AB, BS)  Occupational History   Not on file  Tobacco Use   Smoking status: Never    Passive exposure: Never   Smokeless tobacco: Never   Tobacco comments:    Never smoked anything  Vaping Use   Vaping status: Never Used  Substance and Sexual Activity   Alcohol use: Yes    Alcohol/week: 1.0 standard drink of alcohol    Types: 1 Standard drinks or equivalent per week    Comment: I dont like wine.I corrected these answers   Drug use: Never   Sexual activity: Not Currently    Birth control/protection: Abstinence  Other Topics Concern   Not on file  Social History Narrative   Not on file   Social Drivers of Health    Financial Resource Strain: Low Risk  (11/02/2023)   Overall Financial Resource Strain (CARDIA)    Difficulty of Paying Living Expenses: Not very hard  Food Insecurity: No Food Insecurity (11/02/2023)   Hunger Vital Sign    Worried About Running Out of Food in the Last Year: Never true    Ran Out of Food in the Last Year: Never true  Transportation Needs: No Transportation Needs (11/02/2023)   PRAPARE - Administrator, Civil Service (Medical): No    Lack of Transportation (Non-Medical): No  Physical Activity: Unknown (11/02/2023)   Exercise Vital Sign    Days of Exercise per Week: 0 days    Minutes of Exercise per Session: Not on file  Stress: Stress Concern Present (11/02/2023)   [employer] of Occupational Health - Occupational Stress Questionnaire    Feeling of Stress : Very much  Social Connections: Moderately Isolated (11/02/2023)   Social Connection and Isolation Panel [NHANES]    Frequency of Communication with Friends and Family: More than three times a week    Frequency of Social Gatherings with Friends and Family: Once a week    Attends Religious Services: More than 4 times per year    Active Member of [employer] or Organizations: No    Attends Banker Meetings: Not on file    Marital Status: Widowed     Family History: The patient's family history includes Arthritis in her mother; Colon polyps in her mother; Diabetes in her brother and mother; Hypertension in her brother, mother, and sister; Learning disabilities in her son; Obesity in her daughter; Stroke in her father and mother. There is no history of Colon cancer, Esophageal cancer, Rectal cancer, or Stomach cancer.  ROS:   Review of Systems  Constitution: Negative for decreased appetite, fever and weight gain.  HENT: Negative for congestion, ear discharge, hoarse voice and sore throat.   Eyes: Negative for discharge, redness, vision loss in right eye and visual halos.  Cardiovascular:  Reports  chest pain and palpitations.  Negative for dyspnea on exertion, leg swelling, orthopnea.Respiratory: Negative for cough, hemoptysis, shortness of breath and snoring.   Endocrine: Negative for heat intolerance and polyphagia.  Hematologic/Lymphatic: Negative for bleeding problem. Does not bruise/bleed easily.  Skin: Negative for flushing, nail changes, rash and suspicious lesions.  Musculoskeletal: Negative for arthritis, joint pain, muscle cramps, myalgias, neck pain and stiffness.  Gastrointestinal: Negative for abdominal pain, bowel incontinence, diarrhea and excessive appetite.  Genitourinary: Negative for decreased libido, genital sores and incomplete emptying.  Neurological: Negative for brief paralysis, focal weakness, headaches and loss of balance.  Psychiatric/Behavioral: Negative for altered mental status, depression and suicidal ideas.  Allergic/Immunologic: Negative for HIV exposure and persistent infections.    EKGs/Labs/Other Studies Reviewed:    The following studies were reviewed today:   EKG: None today   CCTA 12/10/2022 Study quality: Misregistration artifact noted.   Aorta: Normal size.  No calcifications.  No dissection.   Aortic Valve:  Trileaflet.  No calcifications.   Coronary Arteries:  Normal coronary origin.  Right dominance.   RCA is a large dominant artery that gives rise to PDA and PLA. There is misregistration and motion artifact in the RCA resulting in poor visibility through the vessel. There is no obvious calcified plaques, however can not fully rule out soft plaques.   Left main is a large artery that gives rise to LAD and LCX arteries.   LAD is a large torturous vessel with no plaques. There is a very mild myocardial bridging in the mid LAD. D1 with minimal (<24%) calcification in the proximal portion of the vessel.   LCX is a non-dominant artery that gives rise to one large OM1 branch. There is no plaque.   Coronary Calcium   Score:   Left main: 0   Left anterior descending artery: 0, D1 17.8   Left circumflex artery: 0   Right coronary artery: 0   Total: 17.8   Percentile: 82   Other findings:   Normal pulmonary vein drainage into the left atrium.   Normal left atrial appendage without a thrombus.   Normal size of the pulmonary artery.   IMPRESSION: 1. Coronary calcium  score of 17.8. There was 82 percentile for age and sex matched control.   2. Normal coronary origin with right dominance.   3. Minimal Coronary artery disease.   CAD-RADS 1. Minimal non-obstructive CAD (0-24%). Consider non-atherosclerotic causes of chest pain. Consider preventive therapy and risk factor modification.    Zio monitor 11/28/2021 Patch Wear Time:  9 days and 12 hours starting November 15, 2021. Indication: Palpitations Patient had a min HR of 64 bpm, max HR of 131 bpm, and avg HR of 93 bpm. Predominant underlying rhythm was Sinus Rhythm. Isolated SVEs were rare (<1.0%), SVE Couplets were rare (<1.0%), and no SVE Triplets were present. Isolated VEs were rare (<1.0%,  1679), and no VE Couplets or VE Triplets were present.   Symptoms associated with sinus rhythm.   Conclusion: Normal/unremarkable study with no evidence of significant arrhythmia.  TTE 10/18/2021 IMPRESSIONS   1. Left ventricular ejection fraction, by estimation, is 60 to 65%. Left ventricular ejection fraction by 3D volume is 59 %. The left ventricle has  normal function. The left ventricle has no regional wall motion abnormalities. There is mild concentric left ventricular hypertrophy. Left ventricular diastolic parameters are consistent with Grade I diastolic dysfunction (impaired relaxation). The average left ventricular global longitudinal strain is -20.4 %. The global  longitudinal strain is normal.   2. Right ventricular systolic function is normal. The right ventricular  size is normal. Tricuspid regurgitation signal is inadequate for  assessing  PA pressure.   3. The mitral valve is grossly normal. No evidence of mitral valve  regurgitation. No evidence of mitral stenosis.   4. The aortic valve is tricuspid. There is mild calcification of the  aortic valve. Aortic valve regurgitation is mild. No aortic stenosis is  present.   Comparison(s): Prior images unable to be directly viewed, comparison made  by report only. 07/11/21 EF 55-60%.   FINDINGS   Left Ventricle: Left ventricular ejection fraction, by estimation, is 60  to 65%. Left ventricular  ejection fraction by 3D volume is 59 %. The left  ventricle has normal function. The left ventricle has no regional wall  motion abnormalities. The average  left ventricular global longitudinal strain is -20.4 %. The global longitudinal strain is normal. The left ventricular internal cavity size was normal in size. There is mild concentric left ventricular hypertrophy.  Left ventricular diastolic parameters  are consistent with Grade I diastolic dysfunction (impaired relaxation).   Right Ventricle: The right ventricular size is normal. No increase in  right ventricular wall thickness. Right ventricular systolic function is  normal. Tricuspid regurgitation signal is inadequate for assessing PA  pressure.   Left Atrium: Left atrial size was normal in size.   Right Atrium: Right atrial size was normal in size.   Pericardium: Trivial pericardial effusion is present.   Mitral Valve: The mitral valve is grossly normal. No evidence of mitral  valve regurgitation. No evidence of mitral valve stenosis.   Tricuspid Valve: The tricuspid valve is normal in structure. Tricuspid  valve regurgitation is trivial. No evidence of tricuspid stenosis.   Aortic Valve: The aortic valve is tricuspid. There is mild calcification  of the aortic valve. Aortic valve regurgitation is mild. Aortic  regurgitation PHT measures 335 msec. No aortic stenosis is present.   Pulmonic Valve: The  pulmonic valve was not well visualized. Pulmonic valve  regurgitation is trivial. No evidence of pulmonic stenosis.   Aorta: The aortic root and ascending aorta are structurally normal, with  no evidence of dilitation.   IAS/Shunts: The atrial septum is grossly normal.    Recent Labs: 04/24/2023: TSH 0.625 10/26/2023: ALT 14; BUN 12; Creatinine, Ser 0.53; Hemoglobin 14.6; Platelets 248; Potassium 3.8; Sodium 144  Recent Lipid Panel    Component Value Date/Time   CHOL 264 (H) 10/26/2023 1009   TRIG 209 (H) 10/26/2023 1009   HDL 60 10/26/2023 1009   CHOLHDL 4.4 10/26/2023 1009   CHOLHDL 4 02/04/2022 0942   VLDL 36.2 02/04/2022 0942   LDLCALC 166 (H) 10/26/2023 1009    Physical Exam:    VS:  BP 132/70   Pulse 91   Ht 5' 5 (1.651 m)   Wt 214 lb (97.1 kg)   SpO2 95%   BMI 35.61 kg/m     Wt Readings from Last 3 Encounters:  12/28/23 214 lb (97.1 kg)  11/09/23 215 lb 12.8 oz (97.9 kg)  11/03/23 218 lb (98.9 kg)     GEN: Well nourished, well developed in no acute distress HEENT: Normal NECK: No JVD; No carotid bruits LYMPHATICS: No lymphadenopathy CARDIAC: S1S2 noted,RRR, no murmurs, rubs, gallops RESPIRATORY:  Clear to auscultation without rales, wheezing or rhonchi  ABDOMEN: Soft, non-tender, non-distended, +bowel sounds, no guarding. EXTREMITIES: No edema, No cyanosis, no clubbing MUSCULOSKELETAL:  No deformity  SKIN: Warm and dry NEUROLOGIC:  Alert and oriented x 3, non-focal PSYCHIATRIC:  Normal affect, good insight  ASSESSMENT:    1. Essential hypertension   2. Hyperlipidemia, unspecified hyperlipidemia type   3. OSA (obstructive sleep apnea)   4. Morbid obesity complicated by dm/ hbp/ hyperlipidemia and OSA     PLAN:    Hyperlipidemia   Recent increase in LDL cholesterol to 166 despite Crestor  therapy. Endocrinologist increased Crestor  dose approximately one month ago.   -Repeat lipid panel in 8 weeks to assess response to increased Crestor  dose.     Type 2 Diabetes Mellitus Recent increase in Hemoglobin A1c.   -Continue lifestyle modifications including diet and exercise.  Sleep Apnea   Reports poor sleep quality and snoring despite CPAP use.   -Consider attending upcoming health seminar on healthy sleep habits.    Stress Management   Reports significant stress related to caring for her father with dementia and managing his affairs.   -Continue to prioritize self-care and stress management strategies, including vacations and spending time with friends.    CAD - no angina symptoms.  Hyperlipidemia - continue with current statin medication.  Blood pressure is acceptable, continue with current antihypertensive regimen.   The patient understands the need to lose weight with diet and exercise. We have discussed specific strategies for this.   The patient is in agreement with the above plan. The patient left the office in stable condition.  The patient will follow up in 6 months or sooner if needed.   Medication Adjustments/Labs and Tests Ordered: Current medicines are reviewed at length with the patient today.  Concerns regarding medicines are outlined above.  Orders Placed This Encounter  Procedures   Lipid panel   EKG 12-Lead    No orders of the defined types were placed in this encounter.    Patient Instructions  Medication Instructions:  Your physician recommends that you continue on your current medications as directed. Please refer to the Current Medication list given to you today.  *If you need a refill on your cardiac medications before your next appointment, please call your pharmacy*   Lab Work: In 8 weeks: Lipids - please come fasting  If you have labs (blood work) drawn today and your tests are completely normal, you will receive your results only by: MyChart Message (if you have MyChart) OR A paper copy in the mail If you have any lab test that is abnormal or we need to change your treatment, we will  call you to review the results.  Follow-Up: At [facility], you and your health needs are our priority.  As part of our continuing mission to provide you with exceptional heart care, we have created designated Provider Care Teams.  These Care Teams include your primary Cardiologist (physician) and Advanced Practice Providers (APPs -  Physician Assistants and Nurse Practitioners) who all work together to provide you with the care you need, when you need it.  Your next appointment:   9 month(s)  Provider:   Corri Delapaz, DO     Other Instructions     Adopting a Healthy Lifestyle.  Know what a healthy weight is for you (roughly BMI <25) and aim to maintain this   Aim for 7+ servings of fruits and vegetables daily   65-80+ fluid ounces of water or unsweet tea for healthy kidneys   Limit to max 1 drink of alcohol per day; avoid smoking/tobacco   Limit animal fats in diet for cholesterol and heart health - choose grass fed whenever available   Avoid highly processed foods, and foods high in saturated/trans fats   Aim for low stress - take time to unwind and care for your mental health   Aim for 150 min of moderate intensity exercise weekly for heart health, and weights twice weekly for bone health   Aim for 7-9 hours of sleep daily   When it comes to diets, agreement about the perfect plan isnt easy to find, even among the experts. Experts at the [facility] [employer] developed an idea known as the Healthy Eating Plate. Just imagine a plate divided into logical, healthy portions.   The emphasis is  on diet quality:   Load up on vegetables and fruits - one-half of your plate: Aim for color and variety, and remember that potatoes dont count.   Go for whole grains - one-quarter of your plate: Whole wheat, barley, wheat berries, quinoa, oats, brown rice, and foods made with them. If you want pasta, go with whole wheat pasta.   Protein power - one-quarter of your  plate: Fish, chicken, beans, and nuts are all healthy, versatile protein sources. Limit red meat.   The diet, however, does go beyond the plate, offering a few other suggestions.   Use healthy plant oils, such as olive, canola, soy, corn, sunflower and peanut. Check the labels, and avoid partially hydrogenated oil, which have unhealthy trans fats.   If youre thirsty, drink water. Coffee and tea are good in moderation, but skip sugary drinks and limit milk and dairy products to one or two daily servings.   The type of carbohydrate in the diet is more important than the amount. Some sources of carbohydrates, such as vegetables, fruits, whole grains, and beans-are healthier than others.   Finally, stay active  Signed, Dub Huntsman, DO  12/30/2023 7:43 PM    [facility]

## 2023-12-28 NOTE — Patient Instructions (Addendum)
 Medication Instructions:  Your physician recommends that you continue on your current medications as directed. Please refer to the Current Medication list given to you today.  *If you need a refill on your cardiac medications before your next appointment, please call your pharmacy*   Lab Work: In 8 weeks: Lipids - please come fasting  If you have labs (blood work) drawn today and your tests are completely normal, you will receive your results only by: MyChart Message (if you have MyChart) OR A paper copy in the mail If you have any lab test that is abnormal or we need to change your treatment, we will call you to review the results.  Follow-Up: At [facility], you and your health needs are our priority.  As part of our continuing mission to provide you with exceptional heart care, we have created designated Provider Care Teams.  These Care Teams include your primary Cardiologist (physician) and Advanced Practice Providers (APPs -  Physician Assistants and Nurse Practitioners) who all work together to provide you with the care you need, when you need it.  Your next appointment:   9 month(s)  Provider:   Kardie Tobb, DO     Other Instructions

## 2024-01-18 ENCOUNTER — Other Ambulatory Visit: Payer: Self-pay | Admitting: Family Medicine

## 2024-01-18 DIAGNOSIS — I10 Essential (primary) hypertension: Secondary | ICD-10-CM

## 2024-01-21 ENCOUNTER — Encounter: Payer: Self-pay | Admitting: Family Medicine

## 2024-02-09 ENCOUNTER — Ambulatory Visit (INDEPENDENT_AMBULATORY_CARE_PROVIDER_SITE_OTHER): Payer: BC Managed Care – PPO | Admitting: Internal Medicine

## 2024-02-09 ENCOUNTER — Encounter: Payer: Self-pay | Admitting: Internal Medicine

## 2024-02-09 ENCOUNTER — Other Ambulatory Visit: Payer: Self-pay | Admitting: Family Medicine

## 2024-02-09 VITALS — BP 126/74 | HR 94 | Ht 65.0 in | Wt 212.0 lb

## 2024-02-09 DIAGNOSIS — E039 Hypothyroidism, unspecified: Secondary | ICD-10-CM | POA: Diagnosis not present

## 2024-02-09 DIAGNOSIS — Z7985 Long-term (current) use of injectable non-insulin antidiabetic drugs: Secondary | ICD-10-CM

## 2024-02-09 DIAGNOSIS — E785 Hyperlipidemia, unspecified: Secondary | ICD-10-CM

## 2024-02-09 DIAGNOSIS — I10 Essential (primary) hypertension: Secondary | ICD-10-CM

## 2024-02-09 DIAGNOSIS — E1165 Type 2 diabetes mellitus with hyperglycemia: Secondary | ICD-10-CM | POA: Diagnosis not present

## 2024-02-09 DIAGNOSIS — Z7984 Long term (current) use of oral hypoglycemic drugs: Secondary | ICD-10-CM

## 2024-02-09 LAB — POCT GLYCOSYLATED HEMOGLOBIN (HGB A1C): Hemoglobin A1C: 9.8 % — AB (ref 4.0–5.6)

## 2024-02-09 MED ORDER — SEMAGLUTIDE (2 MG/DOSE) 8 MG/3ML ~~LOC~~ SOPN: 2.0000 mg | PEN_INJECTOR | SUBCUTANEOUS | 3 refills | Status: DC

## 2024-02-09 MED ORDER — FREESTYLE LIBRE 3 PLUS SENSOR MISC: 1.0000 | 3 refills | Status: DC

## 2024-02-09 NOTE — Progress Notes (Signed)
 Name: Melanie Steele  Age/ Sex: 63 y.o., female   MRN/ DOB: [id number], Sep 18, 1961     PCP: Melida Quitter, PA   Reason for Endocrinology Evaluation: Type 2 Diabetes Mellitus  Initial Endocrine Consultative Visit: 08/21/2021    PATIENT IDENTIFIER: Melanie Steele is a 63 y.o. female with a past medical history of T2DM, OSA, HTN and Dyslipidemia. The patient has followed with Endocrinology clinic since 08/21/2020 for consultative assistance with management of her diabetes.  DIABETIC HISTORY:  Melanie Steele was diagnosed with DM many years ago. Metformin-  Tearing stomach   Her hemoglobin A1c has ranged from 7.9% in 2020, peaking at 9.8% in 2021.  On her initial visit to our clinic she had an A1c 9.8%, she was on Invokana and Trulicity, we started Glipizide      THYROID HISTORY: Has been diagnosed with hyperthyroidism in 1993 secondary to Graves' disease . She is S/P RAI ablation, followed by LT- 4 replacement. She was on levothyroxine initially and switched to armour a few years ago ( ~ 2019) . She has been having issues with adjusting her levels.  By 08/2021 we switched from Armour to Levothyroxine      Father with thyroid disease    SUBJECTIVE:   During the last visit (11/09/2023): A1c 9.9%   Today (02/09/2024): Melanie Steele is here for a follow up on diabetes and hypothyroidism. She checks her blood sugars multiple times daily through CGM . The patient has  /not had hypoglycemic episodes since the last clinic visit.   She had a follow-up with cardiology 12/2023 for mild coronary artery disease seen on coronary CT scan.  In the morning she has low appetite Every 7-10 day she gets transient abdominal pain  Denies constipation or diarrhea     HOME ENDOCRINE  REGIMEN:  Glipizide 10 mg ,1 tablet before Breakfast and 1 tablet before supper  Jardiance 25 mg ,1 tablet in the morning Ozempic 1 mg weekly  Levothyroxine 150 mcg daily  Rosuvastatin 20 mg daily      Statin:  yes ACE-I/ARB: no    CONTINUOUS GLUCOSE MONITORING RECORD INTERPRETATION    Dates of Recording: 2/12-2/25/2025  Sensor description: [employer]  Results statistics:   CGM use % of time 80  Average and SD 186/21.1  Time in range  49  %  % Time Above 180 46  % Time above 250 5  % Time Below target 0   Glycemic patterns summary: BG's trend down overnight and fluctuate throughout the day  Hyperglycemic episodes postprandial  Hypoglycemic episodes occurred N/A  Overnight periods: high        DIABETIC COMPLICATIONS: Microvascular complications:   Denies: CKD, retinopathy, neuropathy  Last Eye Exam: Completed 09/14/2020  Macrovascular complications:   Denies: CAD, CVA, PVD   HISTORY:  Past Medical History:  Past Medical History:  Diagnosis Date   Allergy    Anxiety 04/2023   Family   Arthritis    CPAP (continuous positive airway pressure) dependence    Hyperlipidemia    Hypertension    Myocardial infarction (HCC)    Neuromuscular disorder (HCC) 2017   Trigger fingers   Oxygen deficiency 2009   CPAP   Sleep apnea    Thyroid disease    Type 2 diabetes mellitus (HCC)    Vitamin D deficiency    Past Surgical History:  Past Surgical History:  Procedure Laterality Date   ABDOMINAL HYSTERECTOMY     TONSILLECTOMY     TOOTH  EXTRACTION     TRIGGER FINGER RELEASE     Social History:  reports that she has never smoked. She has never been exposed to tobacco smoke. She has never used smokeless tobacco. She reports current alcohol use of about 1.0 standard drink of alcohol per week. She reports that she does not use drugs. Family History:  Family History  Problem Relation Age of Onset   Colon polyps Mother    Diabetes Mother    Hypertension Mother    Arthritis Mother    Stroke Mother    Stroke Father    Hypertension Sister    Diabetes Brother    Hypertension Brother    Learning disabilities Son    Obesity Daughter    Colon cancer Neg Hx     Esophageal cancer Neg Hx    Rectal cancer Neg Hx    Stomach cancer Neg Hx      HOME MEDICATIONS: Allergies as of 02/09/2024       Reactions   Lisinopril Anaphylaxis   Other Anaphylaxis   Tree nuts, grass, seasonal allergies.    Linagliptin Other (See Comments)   Upset stomach   Metformin And Related Diarrhea        Medication List        Accurate as of February 09, 2024 10:56 AM. If you have any questions, ask your nurse or doctor.          amLODipine 10 MG tablet Commonly known as: NORVASC TAKE 1 TABLET(10 MG) BY MOUTH DAILY What changed: See the new instructions. Changed by: Melida Quitter   empagliflozin 25 MG Tabs tablet Commonly known as: Jardiance Take 1 tablet (25 mg total) by mouth daily.   EPINEPHrine 0.3 mg/0.3 mL Soaj injection Commonly known as: EPI-PEN USE AS DIRECTED FOR ALLERGIC REACTION   fexofenadine 180 MG tablet Commonly known as: ALLEGRA Take 1 tablet by mouth daily.   fluticasone 50 MCG/ACT nasal spray Commonly known as: FLONASE Place into both nostrils 2 (two) times daily.   FreeStyle Libre 3 Plus Sensor Misc 1 Device by Other route every 14 (fourteen) days. Change sensor every 15 days. What changed: See the new instructions. Changed by: Johnney Ou Chelli Yerkes   gabapentin 100 MG capsule Commonly known as: NEURONTIN Take 1 capsule (100 mg total) by mouth 3 (three) times daily as needed.   glipiZIDE 10 MG tablet Commonly known as: GLUCOTROL Take 1 tablet (10 mg total) by mouth 2 (two) times daily before a meal.   hydrochlorothiazide 25 MG tablet Commonly known as: HYDRODIURIL Take 1 tablet (25 mg total) by mouth daily.   levothyroxine 150 MCG tablet Commonly known as: SYNTHROID Take 1 tablet (150 mcg total) by mouth daily before breakfast.   metoprolol succinate 50 MG 24 hr tablet Commonly known as: TOPROL-XL TAKE 1 TABLET(50 MG) BY MOUTH DAILY   multivitamin tablet Take 1 tablet by mouth daily.   OneTouch Verio test  strip Generic drug: glucose blood Daily   potassium chloride SA 20 MEQ tablet Commonly known as: KLOR-CON M TAKE 1 TABLET(20 MEQ) BY MOUTH DAILY   rosuvastatin 20 MG tablet Commonly known as: Crestor Take 1 tablet (20 mg total) by mouth daily.   Semaglutide (1 MG/DOSE) 4 MG/3ML Sopn Inject 1 mg as directed once a week.         OBJECTIVE:   Vital Signs: BP 126/74 (BP Location: Right Arm, Patient Position: Sitting, Cuff Size: Normal)   Pulse 94   Ht 5\' 5"  (1.651 m)  Wt 212 lb (96.2 kg)   SpO2 95%   BMI 35.28 kg/m   Wt Readings from Last 3 Encounters:  02/09/24 212 lb (96.2 kg)  12/28/23 214 lb (97.1 kg)  11/09/23 215 lb 12.8 oz (97.9 kg)     Exam: General: Pt appears well and is in NAD  Lungs: Clear with good BS bilat   Heart: RRR   Abdomen: Soft, nontender,   Extremities: No pretibial edema.   Neuro: MS is good with appropriate affect, pt is alert and Ox3    DM foot exam:11/09/2023   The skin of the feet is intact without sores or ulcerations. The pedal pulses are 2+ on right and 2+ on left. The sensation is intact to a screening 5.07, 10 gram monofilament bilaterally    DATA REVIEWED:  Lab Results  Component Value Date   HGBA1C 9.9 (H) 10/26/2023   HGBA1C 9.2 05/21/2023   HGBA1C 9.4 (A) 02/04/2022    Latest Reference Range & Units 10/26/23 10:09  Sodium 134 - 144 mmol/L 144  Potassium 3.5 - 5.2 mmol/L 3.8  Chloride 96 - 106 mmol/L 102  CO2 20 - 29 mmol/L 22  Glucose 70 - 99 mg/dL 147 (H)  BUN 8 - 27 mg/dL 12  Creatinine 8.29 - 5.62 mg/dL 1.30 (L)  Calcium 8.7 - 10.3 mg/dL 9.8  BUN/Creatinine Ratio 12 - 28  23  eGFR >59 mL/min/1.73 105  Alkaline Phosphatase 44 - 121 IU/L 67  Albumin 3.9 - 4.9 g/dL 4.5  AST 0 - 40 IU/L 15  ALT 0 - 32 IU/L 14  Total Protein 6.0 - 8.5 g/dL 7.4  Total Bilirubin 0.0 - 1.2 mg/dL 0.8  Total CHOL/HDL Ratio 0.0 - 4.4 ratio 4.4  Cholesterol, Total 100 - 199 mg/dL 865 (H)  HDL Cholesterol >39 mg/dL 60   Triglycerides 0 - 149 mg/dL 784 (H)  VLDL Cholesterol Cal 5 - 40 mg/dL 38  LDL Chol Calc (NIH) 0 - 99 mg/dL 696 (H)    Latest Reference Range & Units 04/24/23 08:20  TSH 0.450 - 4.500 uIU/mL 0.625     ASSESSMENT / PLAN / RECOMMENDATIONS:   1) Type 2 Diabetes Mellitus, Poorly controlled, Without complications - Most recent A1c of 9.8 %. Goal A1c < 7.0 %.   -Patient continues with hyperglycemia despite doubling up on glipizide and Ozempic -Patient continues with dietary indiscretions, I did offer another referral to our CDE but she has been to a dietitian in the past -Patient has made some lifestyle changes and has noted improvement in her glucose reading over the past 2 weeks -I will increase Ozempic as below -I did explain to the patient that if her A1c which is 10%, we will have to discuss insulin -I have upgraded CGM to freestyle libre 3+   MEDICATIONS: -Continue glipizide 10 mg , 1 tablet before breakfast and 1 tablet before Supper  - Continue Jardiance 25 mg daily  -Increase Ozempic 2 mg weekly     EDUCATION / INSTRUCTIONS: BG monitoring instructions: Patient is instructed to check her blood sugars 3 times a day, before meals  Call [facility] if: BG persistently < 70  I reviewed the Rule of 15 for the treatment of hypoglycemia in detail with the patient. Literature supplied.    2) Diabetic complications:  Eye: Does not have known diabetic retinopathy.  Neuro/ Feet: Does not have known diabetic peripheral neuropathy .  Renal: Patient does not have known baseline CKD. She   is not  on an ACEI/ARB at present.     3) Postablative Hypothyroidism:   - No local neck symptoms  -Most recent TSH within normal range, no change    Medication:   Continue levothyroxine 150 mcg, 1 tablet daily    4) Dyslipidemia:   -I have increased rosuvastatin from 10 mg to 20 mg 2024 due to LDL above goal -We will continue to monitor   Medication Continue  rosuvastatin 20  mg daily   F/U in 4 months    Signed electronically by: Lyndle Herrlich, MD  [facility] Endocrinology  [facility] [address] [place] [place], [place] [address] Phone: [phone] FAX: [phone]   CC: Melida Quitter, PA [address] [place] [address] Phone: [phone]  Fax: [phone]  Return to Endocrinology clinic as below: Future Appointments  Date Time Provider Department Center  04/27/2024  8:45 AM [facility] None

## 2024-02-09 NOTE — Patient Instructions (Signed)
-   Continue Glipizide 10 mg ,1 tablet before Breakfast and  1 tablet before Supper  - Continue Jardiance 25 mg weekly  - Increase Ozempic 2 mg once weekly       HOW TO TREAT LOW BLOOD SUGARS (Blood sugar LESS THAN 70 MG/DL) Please follow the RULE OF 15 for the treatment of hypoglycemia treatment (when your (blood sugars are less than 70 mg/dL)   STEP 1: Take 15 grams of carbohydrates when your blood sugar is low, which includes:  3-4 GLUCOSE TABS  OR 3-4 OZ OF JUICE OR REGULAR SODA OR ONE TUBE OF GLUCOSE GEL    STEP 2: RECHECK blood sugar in 15 MINUTES STEP 3: If your blood sugar is still low at the 15 minute recheck --> then, go back to STEP 1 and treat AGAIN with another 15 grams of carbohydrates.

## 2024-02-26 ENCOUNTER — Other Ambulatory Visit: Payer: Self-pay | Admitting: Family Medicine

## 2024-02-26 DIAGNOSIS — I10 Essential (primary) hypertension: Secondary | ICD-10-CM

## 2024-04-08 ENCOUNTER — Telehealth: Payer: Self-pay | Admitting: Family Medicine

## 2024-04-08 NOTE — Telephone Encounter (Signed)
 Patient was identified as falling into the [place] Measure - Diabetes.   Patient was: Patient is not currently using our practice. Patient under care at [facility]

## 2024-04-11 DIAGNOSIS — Z1231 Encounter for screening mammogram for malignant neoplasm of breast: Secondary | ICD-10-CM | POA: Diagnosis not present

## 2024-04-11 DIAGNOSIS — R92333 Mammographic heterogeneous density, bilateral breasts: Secondary | ICD-10-CM | POA: Diagnosis not present

## 2024-04-11 LAB — HM MAMMOGRAPHY

## 2024-04-13 ENCOUNTER — Other Ambulatory Visit: Payer: Self-pay | Admitting: Family Medicine

## 2024-04-13 DIAGNOSIS — I10 Essential (primary) hypertension: Secondary | ICD-10-CM

## 2024-04-26 ENCOUNTER — Other Ambulatory Visit: Payer: Self-pay | Admitting: *Deleted

## 2024-04-26 DIAGNOSIS — E785 Hyperlipidemia, unspecified: Secondary | ICD-10-CM

## 2024-04-26 DIAGNOSIS — E039 Hypothyroidism, unspecified: Secondary | ICD-10-CM

## 2024-04-26 DIAGNOSIS — E1159 Type 2 diabetes mellitus with other circulatory complications: Secondary | ICD-10-CM

## 2024-04-27 ENCOUNTER — Telehealth: Payer: Self-pay | Admitting: *Deleted

## 2024-04-27 ENCOUNTER — Other Ambulatory Visit: Payer: BC Managed Care – PPO

## 2024-04-27 DIAGNOSIS — E1159 Type 2 diabetes mellitus with other circulatory complications: Secondary | ICD-10-CM

## 2024-04-27 DIAGNOSIS — E039 Hypothyroidism, unspecified: Secondary | ICD-10-CM

## 2024-04-27 DIAGNOSIS — E785 Hyperlipidemia, unspecified: Secondary | ICD-10-CM

## 2024-04-27 NOTE — Telephone Encounter (Signed)
 Copied from CRM [id number]. Topic: Clinical - Request for Lab/Test Order >> Apr 27, 2024  9:43 AM Rennis Case wrote: Reason for CRM: Patient has appt on 04/29/24 for diagnostic mammogram. Burdette Carolin w/ [facility] calling and requesting for order to be signed. Burdette Carolin states she faxed order yesterday and will refax order today. Requesting order be signed and faxed back as soon as possible.   Fax: [phone]

## 2024-04-27 NOTE — Telephone Encounter (Signed)
 Order was signed and faxed.

## 2024-04-29 DIAGNOSIS — R92331 Mammographic heterogeneous density, right breast: Secondary | ICD-10-CM | POA: Diagnosis not present

## 2024-04-29 DIAGNOSIS — N6001 Solitary cyst of right breast: Secondary | ICD-10-CM | POA: Diagnosis not present

## 2024-04-29 DIAGNOSIS — N6011 Diffuse cystic mastopathy of right breast: Secondary | ICD-10-CM | POA: Diagnosis not present

## 2024-04-29 DIAGNOSIS — N6489 Other specified disorders of breast: Secondary | ICD-10-CM | POA: Diagnosis not present

## 2024-05-02 ENCOUNTER — Ambulatory Visit: Payer: BC Managed Care – PPO | Admitting: Family Medicine

## 2024-05-23 ENCOUNTER — Other Ambulatory Visit

## 2024-05-24 ENCOUNTER — Telehealth: Payer: Self-pay | Admitting: Pharmacy Technician

## 2024-05-24 ENCOUNTER — Other Ambulatory Visit (HOSPITAL_COMMUNITY): Payer: Self-pay

## 2024-05-24 LAB — LIPID PANEL
Chol/HDL Ratio: 2.8 ratio (ref 0.0–4.4)
Cholesterol, Total: 147 mg/dL (ref 100–199)
HDL: 52 mg/dL (ref >39)
LDL Chol Calc (NIH): 74 mg/dL (ref 0–99)
Triglycerides: 120 mg/dL (ref 0–149)
VLDL Cholesterol Cal: 21 mg/dL (ref 5–40)

## 2024-05-24 LAB — COMPREHENSIVE METABOLIC PANEL WITH GFR
ALT: 21 IU/L (ref 0–32)
AST: 19 IU/L (ref 0–40)
Albumin: 4.3 g/dL (ref 3.9–4.9)
Alkaline Phosphatase: 70 IU/L (ref 44–121)
BUN/Creatinine Ratio: 29 — ABNORMAL HIGH (ref 12–28)
BUN: 15 mg/dL (ref 8–27)
Bilirubin Total: 0.6 mg/dL (ref 0.0–1.2)
CO2: 21 mmol/L (ref 20–29)
Calcium: 9.9 mg/dL (ref 8.7–10.3)
Chloride: 103 mmol/L (ref 96–106)
Creatinine, Ser: 0.51 mg/dL — ABNORMAL LOW (ref 0.57–1.00)
Globulin, Total: 2.9 g/dL (ref 1.5–4.5)
Glucose: 207 mg/dL — ABNORMAL HIGH (ref 70–99)
Potassium: 3.9 mmol/L (ref 3.5–5.2)
Sodium: 142 mmol/L (ref 134–144)
Total Protein: 7.2 g/dL (ref 6.0–8.5)
eGFR: 105 mL/min/{1.73_m2} (ref >59)

## 2024-05-24 LAB — CBC WITH DIFFERENTIAL/PLATELET
Basophils Absolute: 0 10*3/uL (ref 0.0–0.2)
Basos: 1 %
EOS (ABSOLUTE): 0.2 10*3/uL (ref 0.0–0.4)
Eos: 2 %
Hematocrit: 46.1 % (ref 34.0–46.6)
Hemoglobin: 14.3 g/dL (ref 11.1–15.9)
Immature Grans (Abs): 0 10*3/uL (ref 0.0–0.1)
Immature Granulocytes: 0 %
Lymphocytes Absolute: 1.9 10*3/uL (ref 0.7–3.1)
Lymphs: 23 %
MCH: 30.2 pg (ref 26.6–33.0)
MCHC: 31 g/dL — ABNORMAL LOW (ref 31.5–35.7)
MCV: 98 fL — ABNORMAL HIGH (ref 79–97)
Monocytes Absolute: 0.5 10*3/uL (ref 0.1–0.9)
Monocytes: 6 %
Neutrophils Absolute: 5.8 10*3/uL (ref 1.4–7.0)
Neutrophils: 68 %
Platelets: 244 10*3/uL (ref 150–450)
RBC: 4.73 x10E6/uL (ref 3.77–5.28)
RDW: 12 % (ref 11.7–15.4)
WBC: 8.5 10*3/uL (ref 3.4–10.8)

## 2024-05-24 LAB — MICROALBUMIN / CREATININE URINE RATIO
Creatinine, Urine: 36.3 mg/dL
Microalb/Creat Ratio: 10 mg/g{creat} (ref 0–29)
Microalbumin, Urine: 3.5 ug/mL

## 2024-05-24 LAB — TSH: TSH: 0.542 u[IU]/mL (ref 0.450–4.500)

## 2024-05-24 LAB — HEMOGLOBIN A1C
Est. average glucose Bld gHb Est-mCnc: 220 mg/dL
Hgb A1c MFr Bld: 9.3 % — ABNORMAL HIGH (ref 4.8–5.6)

## 2024-05-24 NOTE — Telephone Encounter (Signed)
 Pharmacy Patient Advocate Encounter  Received notification from [employer] that Prior Authorization for Ozempic  (2 MG/DOSE) 8MG /3ML pen-injectors has been APPROVED from 05/24/2024 to 05/24/2025. Ran test claim, Copay is $0.00. This test claim was processed through [facility]- copay amounts may vary at other pharmacies due to pharmacy/plan contracts, or as the patient moves through the different stages of their insurance plan.   PA #/Case ID/Reference #: [id number]

## 2024-05-24 NOTE — Telephone Encounter (Signed)
 Pharmacy Patient Advocate Encounter   Received notification from CoverMyMeds that prior authorization for Ozempic  (2 MG/DOSE) 8MG /3ML pen-injectors is required/requested.   Insurance verification completed.   The patient is insured through [employer] .   Per test claim: PA required; PA submitted to above mentioned insurance via CoverMyMeds Key/confirmation #/EOC [id number] Status is pending

## 2024-05-25 ENCOUNTER — Ambulatory Visit: Payer: Self-pay

## 2024-05-27 ENCOUNTER — Other Ambulatory Visit: Payer: Self-pay | Admitting: Family Medicine

## 2024-05-27 DIAGNOSIS — I10 Essential (primary) hypertension: Secondary | ICD-10-CM

## 2024-05-30 ENCOUNTER — Ambulatory Visit

## 2024-05-30 VITALS — BP 131/80 | HR 102 | Ht 65.0 in | Wt 212.0 lb

## 2024-05-30 DIAGNOSIS — M5440 Lumbago with sciatica, unspecified side: Secondary | ICD-10-CM

## 2024-05-30 DIAGNOSIS — E119 Type 2 diabetes mellitus without complications: Secondary | ICD-10-CM

## 2024-05-30 DIAGNOSIS — M545 Low back pain, unspecified: Secondary | ICD-10-CM | POA: Insufficient documentation

## 2024-05-30 DIAGNOSIS — Z7985 Long-term (current) use of injectable non-insulin antidiabetic drugs: Secondary | ICD-10-CM

## 2024-05-30 DIAGNOSIS — I10 Essential (primary) hypertension: Secondary | ICD-10-CM

## 2024-05-30 DIAGNOSIS — E782 Mixed hyperlipidemia: Secondary | ICD-10-CM

## 2024-05-30 DIAGNOSIS — Z7984 Long term (current) use of oral hypoglycemic drugs: Secondary | ICD-10-CM

## 2024-05-30 DIAGNOSIS — Z91018 Allergy to other foods: Secondary | ICD-10-CM | POA: Diagnosis not present

## 2024-05-30 MED ORDER — MELOXICAM 7.5 MG PO TABS: 7.5000 mg | ORAL_TABLET | Freq: Every day | ORAL | 1 refills | Status: DC

## 2024-05-30 MED ORDER — EPINEPHRINE 0.3 MG/0.3ML IJ SOAJ: INTRAMUSCULAR | 0 refills | Status: AC

## 2024-05-30 MED ORDER — EPINEPHRINE 0.3 MG/0.3ML IJ SOAJ
INTRAMUSCULAR | 0 refills | Status: DC
Start: 2024-05-30 — End: 2024-05-30

## 2024-05-30 NOTE — Patient Instructions (Addendum)
 It was nice to see you today!  As we discussed in clinic:  -Continue your medications for blood pressure, cholesterol, and diabetes as prescribed.  -I have refilled your epi pen and sent it to your preferred pharmacy  -I have also sent in Meloxicam 7.5 mg to take nightly as needed for your back pain. Remember not to combine this medication with other over-the-counter NSAIDs like Aleve/Ibuprofen.  -Let's plan to follow up in 5-6 months for physical with fasting labs the week before.   It was nice to meet you!  If you have any problems before your next visit feel free to message me via MyChart (minor issues or questions) or call the office, otherwise you may reach out to schedule an office visit.  Thank you! Meryl Acosta, PA-C

## 2024-05-30 NOTE — Assessment & Plan Note (Signed)
 Start Mobic 7.5 mg nightly as needed for low back pain. Advised patient not to combine this medication with other NSAIDs. Advised to take this medication with food. Will reassess back pain at her next follow up.

## 2024-05-30 NOTE — Assessment & Plan Note (Signed)
 BP goal <130/80.  Stable, at goal.  Continue amlodipine 10 mg daily, hydrochlorothiazide 25 mg daily, metoprolol succinate 50 mg daily.  Most recent CMP within normal limits/fairly stable.  Will continue to monitor.

## 2024-05-30 NOTE — Assessment & Plan Note (Signed)
 Last Lipid Panel: LDL 74, HDL 52, Trig 120. Improved. Continue Rosuvastatin  20 mg daily. The ASCVD Risk score (Arnett DK, et al., 2019) failed to calculate for the following reasons:   Risk score cannot be calculated because patient has a medical history suggesting prior/existing ASCVD  CMP stable. Recheck lipids in 5-6 months,

## 2024-05-30 NOTE — Assessment & Plan Note (Addendum)
 Followed and managed by endocrinology. Most recent A1c slightly improved at 9.3. Diabetic eye exam UTD through [facility]. Will make an attempt to request records. Upcoming appointment with endocrinology on 06/08/24. Will continue to monitor and coordination with endocrinology.

## 2024-05-30 NOTE — Progress Notes (Signed)
 Established Patient Office Visit  Subjective   Patient ID: Melanie Steele, female    DOB: [birth year]  Age: 63 y.o. MRN: [id number]  Chief Complaint  Patient presents with   Hyperlipidemia   Hypertension    HPI  Melanie Steele is a 63 year old female who presents to the clinic today for follow-up on hypertension, hyperlipidemia, diabetes. She is also currently experiencing a flare up of low back pain/sciatica that has been going on for several days. Reports she has had arthritis in her lower back for years and it will flare up time to time. Currently taking Aleve as needed, but reports that it upsets her stomach. Denies saddle anesthesia, numbness, or loss of bowel/bladder function.   Hypertension: Patient currently taking amlodipine , metoprolol , hydrochlorothiazide  for management of blood pressure.  Patient reports excellent compliance with this medication denies side effects.  Is not checking blood pressure at home.  Denies hypotensive episodes.  Denies headache, chest pain, palpitations, shortness of breath, vision changes, edema.  Hyperlipidemia: At her last visit 6 months ago, her rosuvastatin  was increased to 20 mg daily.  Patient reports excellent compliance with this medication.  Denies side effects/new myalgias.  Diabetes: Patient currently taking Jardiance , glipizide , Ozempic  for management of her diabetes.  She is following endocrinology for management of this.  She also has a continuous glucose monitor.  Reports that her most recent follow-up with endocrinology 06/08/24 .  Denies polyuria/polydipsia.  Denies new sores/wounds that are not healing.  Patient also reports that she had a mammogram several months ago which discovered cysts in her right breast. Diagnostic mammo revealed likely benign cluster of cysts in right breast. They recommended follow up mammo and ultrasound in 6 months (November). Patient is concerned because breast cysts/fibrous breast tissue does not run in her  family. She is requesting resources for her mammogram to be read by a 2nd radiologist to confirm that the cysts are not cancerous.      ROS Per HPI.    Objective:     BP 131/80   Pulse (!) 102   Ht 5' 5 (1.651 m)   Wt 212 lb 0.6 oz (96.2 kg)   SpO2 98%   BMI 35.29 kg/m    Physical Exam Constitutional:      General: She is not in acute distress.    Appearance: Normal appearance.   Cardiovascular:     Rate and Rhythm: Normal rate and regular rhythm.     Heart sounds: Normal heart sounds. No murmur heard.    No friction rub. No gallop.  Pulmonary:     Effort: Pulmonary effort is normal. No respiratory distress.     Breath sounds: Normal breath sounds.   Musculoskeletal:        General: No swelling.   Skin:    General: Skin is warm and dry.   Neurological:     General: No focal deficit present.     Mental Status: She is alert.   Psychiatric:        Mood and Affect: Mood normal.        Behavior: Behavior normal.        Thought Content: Thought content normal.    No results found for any visits on 05/30/24.  Last CBC Lab Results  Component Value Date   WBC 8.5 05/23/2024   HGB 14.3 05/23/2024   HCT 46.1 05/23/2024   MCV 98 (H) 05/23/2024   MCH 30.2 05/23/2024   RDW 12.0 05/23/2024   PLT 244 05/23/2024  Last metabolic panel Lab Results  Component Value Date   GLUCOSE 207 (H) 05/23/2024   NA 142 05/23/2024   K 3.9 05/23/2024   CL 103 05/23/2024   CO2 21 05/23/2024   BUN 15 05/23/2024   CREATININE 0.51 (L) 05/23/2024   EGFR 105 05/23/2024   CALCIUM  9.9 05/23/2024   PROT 7.2 05/23/2024   ALBUMIN 4.3 05/23/2024   LABGLOB 2.9 05/23/2024   AGRATIO 1.6 04/24/2023   BILITOT 0.6 05/23/2024   ALKPHOS 70 05/23/2024   AST 19 05/23/2024   ALT 21 05/23/2024   Last lipids Lab Results  Component Value Date   CHOL 147 05/23/2024   HDL 52 05/23/2024   LDLCALC 74 05/23/2024   TRIG 120 05/23/2024   CHOLHDL 2.8 05/23/2024   Last hemoglobin A1c Lab  Results  Component Value Date   HGBA1C 9.3 (H) 05/23/2024   Last thyroid  functions Lab Results  Component Value Date   TSH 0.542 05/23/2024   T4TOTAL 5.6 04/16/2020      The ASCVD Risk score (Arnett DK, et al., 2019) failed to calculate for the following reasons:   Risk score cannot be calculated because patient has a medical history suggesting prior/existing ASCVD    Assessment & Plan:   Essential hypertension Assessment & Plan: BP goal <130/80.  Stable, at goal.  Continue amlodipine  10 mg daily, hydrochlorothiazide  25 mg daily, metoprolol  succinate 50 mg daily.  Most recent CMP within normal limits/fairly stable.  Will continue to monitor.    Allergy to tree nuts -     EPINEPHrine ; USE AS DIRECTED FOR ALLERGIC REACTION  Dispense: 2 each; Refill: 0  Mixed hyperlipidemia Assessment & Plan: Last Lipid Panel: LDL 74, HDL 52, Trig 120. Improved. Continue Rosuvastatin  20 mg daily. The ASCVD Risk score (Arnett DK, et al., 2019) failed to calculate for the following reasons:   Risk score cannot be calculated because patient has a medical history suggesting prior/existing ASCVD  CMP stable. Recheck lipids in 5-6 months,    Type 2 diabetes mellitus without complication, without long-term current use of insulin  [facility]) Assessment & Plan: Followed and managed by endocrinology. Most recent A1c slightly improved at 9.3. Diabetic eye exam UTD through [facility]. Will make an attempt to request records. Upcoming appointment with endocrinology on 06/08/24. Will continue to monitor and coordination with endocrinology.     Midline low back pain with sciatica, sciatica laterality unspecified, unspecified chronicity Assessment & Plan: Start Mobic 7.5 mg nightly as needed for low back pain. Advised patient not to combine this medication with other NSAIDs. Advised to take this medication with food. Will reassess back pain at her next follow up.   Other orders -     Meloxicam; Take 1 tablet  (7.5 mg total) by mouth daily.  Dispense: 30 tablet; Refill: 1      Return in about 5 months (around 10/30/2024) for Physical.    Odilia Bennett, PA-C

## 2024-05-31 NOTE — Progress Notes (Signed)
   05/31/2024  Patient ID: Melanie Steele, female   DOB: Dec 01, 1961, 63 y.o.   MRN: [id number]  Review [place] DM referral. Followed by endocrinology, appt 6/25. No gaps in fill hx for DM meds. Will f/u and outreach prn per PCP request.   Future Appointments  Date Time Provider Department Center  06/08/2024 10:30 AM Shamleffer, Julian Obey, MD [facility] None  10/25/2024  9:00 AM [facility] None  10/31/2024  8:50 AM Jeffie Mingle, Opal Bill, PA-C [facility] None   Rolando Cliche, PharmD, BCGP Clinical Pharmacist  [phone]

## 2024-06-08 ENCOUNTER — Encounter: Payer: Self-pay | Admitting: Internal Medicine

## 2024-06-08 ENCOUNTER — Ambulatory Visit (INDEPENDENT_AMBULATORY_CARE_PROVIDER_SITE_OTHER): Payer: BC Managed Care – PPO | Admitting: Internal Medicine

## 2024-06-08 VITALS — BP 124/70 | HR 93 | Ht 65.0 in | Wt 210.0 lb

## 2024-06-08 DIAGNOSIS — E1165 Type 2 diabetes mellitus with hyperglycemia: Secondary | ICD-10-CM

## 2024-06-08 DIAGNOSIS — Z7984 Long term (current) use of oral hypoglycemic drugs: Secondary | ICD-10-CM | POA: Diagnosis not present

## 2024-06-08 DIAGNOSIS — Z7985 Long-term (current) use of injectable non-insulin antidiabetic drugs: Secondary | ICD-10-CM | POA: Diagnosis not present

## 2024-06-08 DIAGNOSIS — E039 Hypothyroidism, unspecified: Secondary | ICD-10-CM | POA: Diagnosis not present

## 2024-06-08 DIAGNOSIS — E785 Hyperlipidemia, unspecified: Secondary | ICD-10-CM | POA: Diagnosis not present

## 2024-06-08 DIAGNOSIS — Z794 Long term (current) use of insulin: Secondary | ICD-10-CM

## 2024-06-08 MED ORDER — INSULIN PEN NEEDLE 32G X 4 MM MISC: 1.0000 | Freq: Every day | 6 refills | Status: DC

## 2024-06-08 MED ORDER — TRESIBA FLEXTOUCH 100 UNIT/ML ~~LOC~~ SOPN: 18.0000 [IU] | PEN_INJECTOR | Freq: Every day | SUBCUTANEOUS | 3 refills | Status: DC

## 2024-06-08 NOTE — Patient Instructions (Signed)
-   Start Tresiba 18 units once daily - Continue Glipizide  10 mg ,1 tablet before Breakfast and  1 tablet before Supper  - Continue Jardiance  25 mg weekly  - Continue Ozempic  2 mg once weekly       HOW TO TREAT LOW BLOOD SUGARS (Blood sugar LESS THAN 70 MG/DL) Please follow the RULE OF 15 for the treatment of hypoglycemia treatment (when your (blood sugars are less than 70 mg/dL)   STEP 1: Take 15 grams of carbohydrates when your blood sugar is low, which includes:  3-4 GLUCOSE TABS  OR 3-4 OZ OF JUICE OR REGULAR SODA OR ONE TUBE OF GLUCOSE GEL    STEP 2: RECHECK blood sugar in 15 MINUTES STEP 3: If your blood sugar is still low at the 15 minute recheck --> then, go back to STEP 1 and treat AGAIN with another 15 grams of carbohydrates.

## 2024-06-08 NOTE — Progress Notes (Signed)
 Name: Melanie Steele  Age/ Sex: 63 y.o., female   MRN/ DOB: [id number], July 09, 1961     PCP: Gayle Saddie JULIANNA DEVONNA   Reason for Endocrinology Evaluation: Type 2 Diabetes Mellitus  Initial Endocrine Consultative Visit: 08/21/2021    PATIENT IDENTIFIER: [name] is a 63 y.o. female with a past medical history of T2DM, OSA, HTN and Dyslipidemia. The patient has followed with Endocrinology clinic since 08/21/2020 for consultative assistance with management of her diabetes.  DIABETIC HISTORY:  Ms. Din was diagnosed with DM many years ago. Metformin-  Tearing stomach   Her hemoglobin A1c has ranged from 7.9% in 2020, peaking at 9.8% in 2021.  On her initial visit to our clinic she had an A1c 9.8%, she was on Invokana  and Trulicity , we started Glipizide       THYROID  HISTORY: Has been diagnosed with hyperthyroidism in 1993 secondary to Graves' disease . She is S/P RAI ablation, followed by LT- 4 replacement. She was on levothyroxine  initially and switched to armour a few years ago ( ~ 2019) . She has been having issues with adjusting her levels.  By 08/2021 we switched from Armour to Levothyroxine       Father with thyroid  disease    SUBJECTIVE:   During the last visit (02/09/2024): A1c 9.8%   Today (06/08/2024): [name] is here for a follow up on diabetes and hypothyroidism. She checks her blood sugars multiple times daily through CGM . The patient has  /not had hypoglycemic episodes since the last clinic visit.   She had a follow-up with cardiology 12/2023 for mild coronary artery disease seen on coronary CT scan.  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   HOME ENDOCRINE  REGIMEN:  Glipizide  10 mg ,1 tablet before Breakfast and 1 tablet before supper  Jardiance  25 mg ,1 tablet in the morning Ozempic  2 mg weekly  Levothyroxine  150 mcg daily   Rosuvastatin  20 mg daily      Statin: yes ACE-I/ARB: no    CONTINUOUS GLUCOSE MONITORING RECORD INTERPRETATION    Dates of Recording: 6/12-6/25/2025  Sensor description: [employer]  Results statistics:   CGM use % of time 92  Average and SD 206/21.5  Time in range 28 %  % Time Above 180 58  % Time above 250 14  % Time Below target 0   Glycemic patterns summary: BG's remain high throughout the day and night  Hyperglycemic episodes postprandial  Hypoglycemic episodes occurred N/A  Overnight periods: high        DIABETIC COMPLICATIONS: Microvascular complications:   Denies: CKD, retinopathy, neuropathy  Last Eye Exam: Completed 03/30/2024  Macrovascular complications:   Denies: CAD, CVA, PVD   HISTORY:  Past Medical History:  Past Medical History:  Diagnosis Date   Allergy    Anxiety 04/2023   Family   Arthritis    CPAP (continuous positive airway pressure) dependence    Hyperlipidemia    Hypertension    Myocardial infarction (HCC)    Neuromuscular disorder (HCC) 2017   Trigger fingers   Oxygen deficiency 2009   CPAP   Sleep apnea    Thyroid  disease    Type 2 diabetes mellitus (HCC)    Vitamin D  deficiency    Past Surgical History:  Past Surgical History:  Procedure Laterality Date   ABDOMINAL HYSTERECTOMY  TONSILLECTOMY     TOOTH EXTRACTION     TRIGGER FINGER RELEASE     Social History:  reports that she has never smoked. She has never been exposed to tobacco smoke. She has never used smokeless tobacco. She reports current alcohol use of about 1.0 standard drink of alcohol per week. She reports that she does not use drugs. Family History:  Family History  Problem Relation Age of Onset   Colon polyps Mother    Diabetes Mother    Hypertension Mother    Arthritis Mother    Stroke Mother    Stroke Father    Hypertension Sister    Diabetes Brother    Hypertension Brother    Learning disabilities Son    Obesity Daughter     Colon cancer Neg Hx    Esophageal cancer Neg Hx    Rectal cancer Neg Hx    Stomach cancer Neg Hx      HOME MEDICATIONS: Allergies as of 06/08/2024       Reactions   Lisinopril Anaphylaxis   Other Anaphylaxis   Tree nuts, grass, seasonal allergies.    Linagliptin Other (See Comments)   Upset stomach   Metformin And Related Diarrhea        Medication List        Accurate as of June 08, 2024 10:35 AM. If you have any questions, ask your nurse or doctor.          amLODipine  10 MG tablet Commonly known as: NORVASC  TAKE 1 TABLET(10 MG) BY MOUTH DAILY   empagliflozin  25 MG Tabs tablet Commonly known as: Jardiance  Take 1 tablet (25 mg total) by mouth daily.   EPINEPHrine  0.3 mg/0.3 mL Soaj injection Commonly known as: EPI-PEN USE AS DIRECTED FOR ALLERGIC REACTION   fexofenadine 180 MG tablet Commonly known as: ALLEGRA Take 1 tablet by mouth daily.   fluticasone 50 MCG/ACT nasal spray Commonly known as: FLONASE Place into both nostrils 2 (two) times daily.   FreeStyle Libre 3 Plus Sensor Misc 1 Device by Other route every 14 (fourteen) days. Change sensor every 15 days.   gabapentin  100 MG capsule Commonly known as: NEURONTIN  Take 1 capsule (100 mg total) by mouth 3 (three) times daily as needed.   glipiZIDE  10 MG tablet Commonly known as: GLUCOTROL  Take 1 tablet (10 mg total) by mouth 2 (two) times daily before a meal.   hydrochlorothiazide  25 MG tablet Commonly known as: HYDRODIURIL  TAKE 1 TABLET(25 MG) BY MOUTH DAILY   levothyroxine  150 MCG tablet Commonly known as: SYNTHROID  Take 1 tablet (150 mcg total) by mouth daily before breakfast.   meloxicam 7.5 MG tablet Commonly known as: MOBIC Take 1 tablet (7.5 mg total) by mouth daily.   metoprolol  succinate 50 MG 24 hr tablet Commonly known as: TOPROL -XL TAKE 1 TABLET(50 MG) BY MOUTH DAILY   multivitamin tablet Take 1 tablet by mouth daily.   OneTouch Verio test strip Generic drug: glucose  blood Daily   potassium chloride  SA 20 MEQ tablet Commonly known as: KLOR-CON  M TAKE 1 TABLET(20 MEQ) BY MOUTH DAILY   rosuvastatin  20 MG tablet Commonly known as: Crestor  Take 1 tablet (20 mg total) by mouth daily.   Semaglutide  (2 MG/DOSE) 8 MG/3ML Sopn Inject 2 mg as directed once a week.         OBJECTIVE:   Vital Signs: BP 124/70 (BP Location: Left Arm, Patient Position: Sitting, Cuff Size: Normal)   Pulse 93   Ht 5' 5 (1.651  m)   Wt 210 lb (95.3 kg)   SpO2 95%   BMI 34.95 kg/m   Wt Readings from Last 3 Encounters:  06/08/24 210 lb (95.3 kg)  05/30/24 212 lb 0.6 oz (96.2 kg)  02/09/24 212 lb (96.2 kg)     Exam: General: Pt appears well and is in NAD  Lungs: Clear with good BS bilat   Heart: RRR   Abdomen: Soft, nontender,   Extremities: No pretibial edema.   Neuro: MS is good with appropriate affect, pt is alert and Ox3    DM foot exam:11/09/2023   The skin of the feet is intact without sores or ulcerations. The pedal pulses are 2+ on right and 2+ on left. The sensation is intact to a screening 5.07, 10 gram monofilament bilaterally    DATA REVIEWED:  Lab Results  Component Value Date   HGBA1C 9.3 (H) 05/23/2024   HGBA1C 9.8 (A) 02/09/2024   HGBA1C 9.9 (H) 10/26/2023    Latest Reference Range & Units 05/23/24 10:05  Sodium 134 - 144 mmol/L 142  Potassium 3.5 - 5.2 mmol/L 3.9  Chloride 96 - 106 mmol/L 103  CO2 20 - 29 mmol/L 21  Glucose 70 - 99 mg/dL 792 (H)  BUN 8 - 27 mg/dL 15  Creatinine 9.42 - 8.99 mg/dL 9.48 (L)  Calcium  8.7 - 10.3 mg/dL 9.9  BUN/Creatinine Ratio 12 - 28  29 (H)  eGFR >59 mL/min/1.73 105  Alkaline Phosphatase 44 - 121 IU/L 70  Albumin 3.9 - 4.9 g/dL 4.3  AST 0 - 40 IU/L 19  ALT 0 - 32 IU/L 21  Total Protein 6.0 - 8.5 g/dL 7.2  Total Bilirubin 0.0 - 1.2 mg/dL 0.6  Total CHOL/HDL Ratio 0.0 - 4.4 ratio 2.8  Cholesterol, Total 100 - 199 mg/dL 852  HDL Cholesterol >60 mg/dL 52  MICROALB/CREAT RATIO 0 - 29 mg/g creat  10  Triglycerides 0 - 149 mg/dL 879  VLDL Cholesterol Cal 5 - 40 mg/dL 21  LDL Chol Calc (NIH) 0 - 99 mg/dL 74    Latest Reference Range & Units 05/23/24 10:05  TSH 0.450 - 4.500 uIU/mL 0.542    Latest Reference Range & Units 05/23/24 10:05  Microalbumin, Urine Not Estab. ug/mL 3.5  MICROALB/CREAT RATIO 0 - 29 mg/g creat 10  Creatinine, Urine Not Estab. mg/dL 63.6    ASSESSMENT / PLAN / RECOMMENDATIONS:   1) Type 2 Diabetes Mellitus, Poorly controlled, Without complications - Most recent A1c of 9.3 %. Goal A1c < 7.0 %.   -Patient continues with hyperglycemia despite doubling up on Ozempic  -I have recommended starting basal insulin .  She was on insulin  when she was initially diagnosed. - She was encouraged to continue with lifestyle changes - Caution against hypoglycemia - I have asked her to confirm hypoglycemic readings from freestyle libre with a fingerstick  MEDICATIONS: -Start Tresiba 18 units daily - Continue glipizide  10 mg , 1 tablet before breakfast and 1 tablet before Supper  - Continue Jardiance  25 mg daily  -  Ozempic  2 mg weekly     EDUCATION / INSTRUCTIONS: BG monitoring instructions: Patient is instructed to check her blood sugars 3 times a day, before meals  Call [facility] if: BG persistently < 70  I reviewed the Rule of 15 for the treatment of hypoglycemia in detail with the patient. Literature supplied.    2) Diabetic complications:  Eye: Does not have known diabetic retinopathy.  Neuro/ Feet: Does not have known diabetic peripheral  neuropathy .  Renal: Patient does not have known baseline CKD. She   is not  on an ACEI/ARB at present.     3) Postablative Hypothyroidism:   - No local neck symptoms  -Most recent TSH within normal range, no change    Medication:   Continue levothyroxine  150 mcg, 1 tablet daily    4) Dyslipidemia:   -I have increased rosuvastatin  from 10 mg to 20 mg 2024 due to LDL above goal - Most recent  LDL is at goal, no change   Medication Continue rosuvastatin  20  mg daily   F/U in 4 months    Signed electronically by: Stefano Redgie Butts, MD  [facility] Endocrinology  [facility] [address] [place] [place], [place] [address] Phone: [phone] FAX: [phone]   CC: Gayle Saddie JULIANNA DEVONNA [address] [place] [address] Phone: [phone]  Fax: [phone]  Return to Endocrinology clinic as below: Future Appointments  Date Time Provider Department Center  10/25/2024  9:00 AM [facility] None  10/31/2024  8:50 AM Gayle Saddie JULIANNA, PA-C [facility] None

## 2024-07-13 ENCOUNTER — Other Ambulatory Visit: Payer: Self-pay | Admitting: Family Medicine

## 2024-07-13 DIAGNOSIS — I10 Essential (primary) hypertension: Secondary | ICD-10-CM

## 2024-08-22 ENCOUNTER — Other Ambulatory Visit: Payer: Self-pay

## 2024-08-22 DIAGNOSIS — I10 Essential (primary) hypertension: Secondary | ICD-10-CM

## 2024-08-23 ENCOUNTER — Other Ambulatory Visit: Payer: Self-pay | Admitting: Family Medicine

## 2024-08-23 DIAGNOSIS — I10 Essential (primary) hypertension: Secondary | ICD-10-CM

## 2024-08-30 ENCOUNTER — Telehealth: Payer: Self-pay | Admitting: Dietician

## 2024-08-30 NOTE — Telephone Encounter (Signed)
 Patient called and stated that she ran out of the Levothyroxine  Saturday.  Put in a request for the medication at the pharmacy that she needed over one week ago but pharmacy states they don't have a prescription.  Will forward to CMA  Leita Constable, RD, LDN, CDCES, DipACLM

## 2024-08-31 MED ORDER — LEVOTHYROXINE SODIUM 150 MCG PO TABS: 150.0000 ug | ORAL_TABLET | Freq: Every day | ORAL | 0 refills | Status: DC

## 2024-08-31 NOTE — Telephone Encounter (Signed)
 Done

## 2024-09-09 ENCOUNTER — Encounter: Payer: Self-pay | Admitting: Internal Medicine

## 2024-09-09 ENCOUNTER — Ambulatory Visit: Admitting: Internal Medicine

## 2024-09-09 VITALS — BP 122/76 | HR 92 | Ht 65.0 in | Wt 217.4 lb

## 2024-09-09 DIAGNOSIS — E039 Hypothyroidism, unspecified: Secondary | ICD-10-CM

## 2024-09-09 DIAGNOSIS — E1165 Type 2 diabetes mellitus with hyperglycemia: Secondary | ICD-10-CM

## 2024-09-09 DIAGNOSIS — E785 Hyperlipidemia, unspecified: Secondary | ICD-10-CM

## 2024-09-09 LAB — POCT GLYCOSYLATED HEMOGLOBIN (HGB A1C): Hemoglobin A1C: 8.8 % — AB (ref 4.0–5.6)

## 2024-09-09 MED ORDER — LEVOTHYROXINE SODIUM 150 MCG PO TABS: 150.0000 ug | ORAL_TABLET | Freq: Every day | ORAL | 3 refills | Status: DC

## 2024-09-09 MED ORDER — GLIPIZIDE 10 MG PO TABS: 10.0000 mg | ORAL_TABLET | Freq: Two times a day (BID) | ORAL | 3 refills | Status: DC

## 2024-09-09 MED ORDER — INSULIN PEN NEEDLE 32G X 4 MM MISC: 1.0000 | Freq: Every day | 3 refills | Status: DC

## 2024-09-09 MED ORDER — EMPAGLIFLOZIN 25 MG PO TABS: 25.0000 mg | ORAL_TABLET | Freq: Every day | ORAL | 3 refills | Status: DC

## 2024-09-09 MED ORDER — SEMAGLUTIDE (2 MG/DOSE) 8 MG/3ML ~~LOC~~ SOPN: 2.0000 mg | PEN_INJECTOR | SUBCUTANEOUS | 3 refills | Status: DC

## 2024-09-09 MED ORDER — TRESIBA FLEXTOUCH 100 UNIT/ML ~~LOC~~ SOPN: 20.0000 [IU] | PEN_INJECTOR | Freq: Every day | SUBCUTANEOUS | 4 refills | Status: DC

## 2024-09-09 NOTE — Patient Instructions (Signed)
-   Increase Tresiba  20 units once daily - Continue Glipizide  10 mg ,1 tablet before Breakfast and  1 tablet before Supper  - Continue Jardiance  25 mg weekly  - Continue Ozempic  2 mg once weekly       HOW TO TREAT LOW BLOOD SUGARS (Blood sugar LESS THAN 70 MG/DL) Please follow the RULE OF 15 for the treatment of hypoglycemia treatment (when your (blood sugars are less than 70 mg/dL)   STEP 1: Take 15 grams of carbohydrates when your blood sugar is low, which includes:  3-4 GLUCOSE TABS  OR 3-4 OZ OF JUICE OR REGULAR SODA OR ONE TUBE OF GLUCOSE GEL    STEP 2: RECHECK blood sugar in 15 MINUTES STEP 3: If your blood sugar is still low at the 15 minute recheck --> then, go back to STEP 1 and treat AGAIN with another 15 grams of carbohydrates.

## 2024-09-09 NOTE — Progress Notes (Signed)
 Name: Melanie Steele  Age/ Sex: 63 y.o., female   MRN/ DOB: [id number], 10/27/1961     PCP: Gayle Saddie JULIANNA DEVONNA   Reason for Endocrinology Evaluation: Type 2 Diabetes Mellitus  Initial Endocrine Consultative Visit: 08/21/2021    PATIENT IDENTIFIER: Melanie Steele is a 63 y.o. female with a past medical history of T2DM, OSA, HTN and Dyslipidemia. The patient has followed with Endocrinology clinic since 08/21/2020 for consultative assistance with management of her diabetes.  DIABETIC HISTORY:  Melanie Steele was diagnosed with DM many years ago. Metformin-  Tearing stomach   Her hemoglobin A1c has ranged from 7.9% in 2020, peaking at 9.8% in 2021.  On her initial visit to our clinic she had an A1c 9.8%, she was on Invokana  and Trulicity , we started Glipizide    Started basal insulin  in June, 2025 with an A1c of 9.3%   THYROID  HISTORY: Has been diagnosed with hyperthyroidism in 1993 secondary to Graves' disease . She is S/P RAI ablation, followed by LT- 4 replacement. She was on levothyroxine  initially and switched to armour a few years ago ( ~ 2019) . She has been having issues with adjusting her levels.  By 08/2021 we switched from Armour to Levothyroxine       Father with thyroid  disease    SUBJECTIVE:   During the last visit (06/08/2024): A1c 9.3%   Today (09/09/2024): Melanie Steele is here for a follow up on diabetes and hypothyroidism. She checks her blood sugars multiple times daily through CGM . The patient has  /not had hypoglycemic episodes since the last clinic visit.   She had a follow-up with cardiology for mild coronary artery disease seen on coronary CT scan. No recent nausea  No recent constipation or diarrhea  Patient unfortunately lost her father in August, 2025  HOME ENDOCRINE  REGIMEN:  Glipizide  10 mg ,1 tablet before Breakfast and 1 tablet before supper  Jardiance  25 mg ,1 tablet in the morning Ozempic  2 mg weekly  Tresiba  18 units daily Levothyroxine  150 mcg  daily  Rosuvastatin  20 mg daily      Statin: yes ACE-I/ARB: no    CONTINUOUS GLUCOSE MONITORING RECORD INTERPRETATION    Dates of Recording: 9/13-9/26/2025  Sensor description: [employer]  Results statistics:   CGM use % of time 94  Average and SD 188/20.0  Time in range 44%  % Time Above 180 51  % Time above 250 5  % Time Below target 0   Glycemic patterns summary: BGs trend down to optimal overnight and fluctuate during the day  Hyperglycemic episodes postprandial  Hypoglycemic episodes occurred N/A  Overnight periods: Optimal       DIABETIC COMPLICATIONS: Microvascular complications:   Denies: CKD, retinopathy, neuropathy  Last Eye Exam: Completed 03/30/2024  Macrovascular complications:   Denies: CAD, CVA, PVD   HISTORY:  Past Medical History:  Past Medical History:  Diagnosis Date   Allergy    Anxiety 04/2023   Family   Arthritis    CPAP (continuous positive airway pressure) dependence    Hyperlipidemia    Hypertension    Myocardial infarction (HCC)    Neuromuscular disorder (HCC) 2017   Trigger fingers   Oxygen deficiency 2009   CPAP   Sleep apnea    Thyroid  disease    Type 2 diabetes mellitus (HCC)    Vitamin D  deficiency    Past Surgical History:  Past Surgical History:  Procedure Laterality Date   ABDOMINAL HYSTERECTOMY     TONSILLECTOMY  TOOTH EXTRACTION     TRIGGER FINGER RELEASE     Social History:  reports that she has never smoked. She has never been exposed to tobacco smoke. She has never used smokeless tobacco. She reports current alcohol use of about 1.0 standard drink of alcohol per week. She reports that she does not use drugs. Family History:  Family History  Problem Relation Age of Onset   Colon polyps Mother    Diabetes Mother    Hypertension Mother    Arthritis Mother    Stroke Mother    Stroke Father    Hypertension Sister    Diabetes Brother    Hypertension Brother    Learning disabilities Son     Obesity Daughter    Colon cancer Neg Hx    Esophageal cancer Neg Hx    Rectal cancer Neg Hx    Stomach cancer Neg Hx      HOME MEDICATIONS: Allergies as of 09/09/2024       Reactions   Lisinopril Anaphylaxis   Other Anaphylaxis   Tree nuts, grass, seasonal allergies.    Linagliptin Other (See Comments)   Upset stomach   Metformin And Related Diarrhea        Medication List        Accurate as of September 09, 2024 10:53 AM. If you have any questions, ask your nurse or doctor.          amLODipine  10 MG tablet Commonly known as: NORVASC  TAKE 1 TABLET(10 MG) BY MOUTH DAILY   empagliflozin  25 MG Tabs tablet Commonly known as: Jardiance  Take 1 tablet (25 mg total) by mouth daily.   EPINEPHrine  0.3 mg/0.3 mL Soaj injection Commonly known as: EPI-PEN USE AS DIRECTED FOR ALLERGIC REACTION   fexofenadine 180 MG tablet Commonly known as: ALLEGRA Take 1 tablet by mouth daily.   fluticasone 50 MCG/ACT nasal spray Commonly known as: FLONASE Place into both nostrils 2 (two) times daily.   FreeStyle Libre 3 Plus Sensor Misc 1 Device by Other route every 14 (fourteen) days. Change sensor every 15 days.   gabapentin  100 MG capsule Commonly known as: NEURONTIN  Take 1 capsule (100 mg total) by mouth 3 (three) times daily as needed.   glipiZIDE  10 MG tablet Commonly known as: GLUCOTROL  Take 1 tablet (10 mg total) by mouth 2 (two) times daily before a meal.   hydrochlorothiazide  25 MG tablet Commonly known as: HYDRODIURIL  TAKE 1 TABLET(25 MG) BY MOUTH DAILY   Insulin  Pen Needle 32G X 4 MM Misc 1 Device by Does not apply route daily in the afternoon.   levothyroxine  150 MCG tablet Commonly known as: SYNTHROID  Take 1 tablet (150 mcg total) by mouth daily before breakfast.   meloxicam  7.5 MG tablet Commonly known as: MOBIC  Take 1 tablet (7.5 mg total) by mouth daily.   metoprolol  succinate 50 MG 24 hr tablet Commonly known as: TOPROL -XL TAKE 1 TABLET(50 MG) BY  MOUTH DAILY   multivitamin tablet Take 1 tablet by mouth daily.   OneTouch Verio test strip Generic drug: glucose blood Daily   potassium chloride  SA 20 MEQ tablet Commonly known as: KLOR-CON  M TAKE 1 TABLET(20 MEQ) BY MOUTH DAILY   rosuvastatin  20 MG tablet Commonly known as: Crestor  Take 1 tablet (20 mg total) by mouth daily.   Semaglutide  (2 MG/DOSE) 8 MG/3ML Sopn Inject 2 mg as directed once a week.   Tresiba  FlexTouch 100 UNIT/ML FlexTouch Pen Generic drug: insulin  degludec Inject 18 Units into the skin daily.  OBJECTIVE:   Vital Signs: BP 122/76 (BP Location: Left Arm, Patient Position: Sitting, Cuff Size: Large)   Pulse 92   Ht 5' 5 (1.651 m)   Wt 217 lb 6.4 oz (98.6 kg)   SpO2 95%   BMI 36.18 kg/m   Wt Readings from Last 3 Encounters:  09/09/24 217 lb 6.4 oz (98.6 kg)  06/08/24 210 lb (95.3 kg)  05/30/24 212 lb 0.6 oz (96.2 kg)     Exam: General: Pt appears well and is in NAD  Lungs: Clear with good BS bilat   Heart: RRR   Abdomen: Soft, nontender,   Extremities: Trace  pretibial edema.   Neuro: MS is good with appropriate affect, pt is alert and Ox3    DM foot exam:09/09/2024   The skin of the feet is intact without sores or ulcerations. The pedal pulses are 2+ on right and 2+ on left. The sensation is intact to a screening 5.07, 10 gram monofilament bilaterally    DATA REVIEWED:  Lab Results  Component Value Date   HGBA1C 8.8 (A) 09/09/2024   HGBA1C 9.3 (H) 05/23/2024   HGBA1C 9.8 (A) 02/09/2024    Latest Reference Range & Units 05/23/24 10:05  Sodium 134 - 144 mmol/L 142  Potassium 3.5 - 5.2 mmol/L 3.9  Chloride 96 - 106 mmol/L 103  CO2 20 - 29 mmol/L 21  Glucose 70 - 99 mg/dL 792 (H)  BUN 8 - 27 mg/dL 15  Creatinine 9.42 - 8.99 mg/dL 9.48 (L)  Calcium  8.7 - 10.3 mg/dL 9.9  BUN/Creatinine Ratio 12 - 28  29 (H)  eGFR >59 mL/min/1.73 105  Alkaline Phosphatase 44 - 121 IU/L 70  Albumin 3.9 - 4.9 g/dL 4.3  AST 0 - 40  IU/L 19  ALT 0 - 32 IU/L 21  Total Protein 6.0 - 8.5 g/dL 7.2  Total Bilirubin 0.0 - 1.2 mg/dL 0.6  Total CHOL/HDL Ratio 0.0 - 4.4 ratio 2.8  Cholesterol, Total 100 - 199 mg/dL 852  HDL Cholesterol >60 mg/dL 52  MICROALB/CREAT RATIO 0 - 29 mg/g creat 10  Triglycerides 0 - 149 mg/dL 879  VLDL Cholesterol Cal 5 - 40 mg/dL 21  LDL Chol Calc (NIH) 0 - 99 mg/dL 74    Latest Reference Range & Units 05/23/24 10:05  TSH 0.450 - 4.500 uIU/mL 0.542    Latest Reference Range & Units 05/23/24 10:05  Microalbumin, Urine Not Estab. ug/mL 3.5  MICROALB/CREAT RATIO 0 - 29 mg/g creat 10  Creatinine, Urine Not Estab. mg/dL 63.6    ASSESSMENT / PLAN / RECOMMENDATIONS:   1) Type 2 Diabetes Mellitus, Poorly controlled, Without complications - Most recent A1c of 8.8 %. Goal A1c < 7.0 %.    - A1c is trending down but continues to be above goal -Patient has been noted with postprandial hyperglycemia, but she also has been noted with hypoglycemia during the day, I did encourage the patient to take half of glipizide  in the morning if she is going to eat a smaller breakfast than usual, to prevent hypoglycemia. -We did discuss avoiding snacking at night - I will increase her basal insulin  as below - #90 day supply was sent to the pharmacy per patient's request    MEDICATIONS: - Increase Tresiba  20 units daily - Continue glipizide  10 mg , 1 tablet before breakfast and 1 tablet before Supper  - Continue Jardiance  25 mg daily  - Continue Ozempic  2 mg weekly     EDUCATION / INSTRUCTIONS: BG monitoring  instructions: Patient is instructed to check her blood sugars 3 times a day, before meals  Call [facility] if: BG persistently < 70  I reviewed the Rule of 15 for the treatment of hypoglycemia in detail with the patient. Literature supplied.    2) Diabetic complications:  Eye: Does not have known diabetic retinopathy.  Neuro/ Feet: Does not have known diabetic peripheral neuropathy  .  Renal: Patient does not have known baseline CKD. She   is not  on an ACEI/ARB at present.     3) Postablative Hypothyroidism:   - No local neck symptoms - TSH has been within normal range - A refill was provided    Medication:   Continue levothyroxine  150 mcg, 1 tablet daily    4) Dyslipidemia:   -I have increased rosuvastatin  from 10 mg to 20 mg 2024 due to LDL above goal - Most recent LDL is at goal, no change   Medication Continue rosuvastatin  20  mg daily   F/U in 4 months    Signed electronically by: Stefano Redgie Butts, MD  [facility] Endocrinology  [facility] [address] [place] [place], [place] [address] Phone: [phone] FAX: [phone]   CC: Gayle Saddie JULIANNA DEVONNA [address] [place] [address] Phone: [phone]  Fax: [phone]  Return to Endocrinology clinic as below: Future Appointments  Date Time Provider Department Center  10/25/2024  9:00 AM [facility]  10/31/2024  8:50 AM Gayle Saddie JULIANNA DEVONNA [facility]  11/15/2024  8:00 AM Tobb, Dub, DO [facility]

## 2024-09-16 ENCOUNTER — Telehealth: Payer: Self-pay | Admitting: *Deleted

## 2024-09-16 NOTE — Telephone Encounter (Signed)
 Tried to contact pt to inform  her that we need to reschedule her appt on 11/17, due to provider being out of the office.  I will send a mychart message as well.

## 2024-09-30 NOTE — Progress Notes (Signed)
 Melanie Steele                                          MRN: [id number]   09/30/2024   The [facility] Quality Team Specialist reviewed this patient medical record for the purposes of chart review for care gap closure. The following were reviewed: chart review for care gap closure-glycemic status assessment.    [facility] Quality Team

## 2024-10-09 ENCOUNTER — Other Ambulatory Visit: Payer: Self-pay

## 2024-10-09 DIAGNOSIS — I10 Essential (primary) hypertension: Secondary | ICD-10-CM

## 2024-10-24 ENCOUNTER — Other Ambulatory Visit: Payer: Self-pay

## 2024-10-24 DIAGNOSIS — Z130 Encounter for screening for diseases of the blood and blood-forming organs and certain disorders involving the immune mechanism: Secondary | ICD-10-CM

## 2024-10-24 DIAGNOSIS — E119 Type 2 diabetes mellitus without complications: Secondary | ICD-10-CM

## 2024-10-24 DIAGNOSIS — I152 Hypertension secondary to endocrine disorders: Secondary | ICD-10-CM

## 2024-10-24 DIAGNOSIS — E782 Mixed hyperlipidemia: Secondary | ICD-10-CM

## 2024-10-24 DIAGNOSIS — E039 Hypothyroidism, unspecified: Secondary | ICD-10-CM

## 2024-10-25 ENCOUNTER — Other Ambulatory Visit

## 2024-10-25 DIAGNOSIS — E1159 Type 2 diabetes mellitus with other circulatory complications: Secondary | ICD-10-CM | POA: Diagnosis not present

## 2024-10-25 DIAGNOSIS — E119 Type 2 diabetes mellitus without complications: Secondary | ICD-10-CM

## 2024-10-25 DIAGNOSIS — I152 Hypertension secondary to endocrine disorders: Secondary | ICD-10-CM

## 2024-10-25 DIAGNOSIS — E039 Hypothyroidism, unspecified: Secondary | ICD-10-CM

## 2024-10-25 DIAGNOSIS — E782 Mixed hyperlipidemia: Secondary | ICD-10-CM | POA: Diagnosis not present

## 2024-10-25 DIAGNOSIS — Z130 Encounter for screening for diseases of the blood and blood-forming organs and certain disorders involving the immune mechanism: Secondary | ICD-10-CM

## 2024-10-26 ENCOUNTER — Ambulatory Visit: Payer: Self-pay

## 2024-10-26 LAB — COMPREHENSIVE METABOLIC PANEL WITH GFR
ALT: 22 IU/L (ref 0–32)
AST: 17 IU/L (ref 0–40)
Albumin: 4.5 g/dL (ref 3.9–4.9)
Alkaline Phosphatase: 63 IU/L (ref 49–135)
BUN/Creatinine Ratio: 25 (ref 12–28)
BUN: 14 mg/dL (ref 8–27)
Bilirubin Total: 0.9 mg/dL (ref 0.0–1.2)
CO2: 21 mmol/L (ref 20–29)
Calcium: 9.6 mg/dL (ref 8.7–10.3)
Chloride: 103 mmol/L (ref 96–106)
Creatinine, Ser: 0.56 mg/dL — ABNORMAL LOW (ref 0.57–1.00)
Globulin, Total: 2.6 g/dL (ref 1.5–4.5)
Glucose: 171 mg/dL — ABNORMAL HIGH (ref 70–99)
Potassium: 4 mmol/L (ref 3.5–5.2)
Sodium: 142 mmol/L (ref 134–144)
Total Protein: 7.1 g/dL (ref 6.0–8.5)
eGFR: 103 mL/min/1.73 (ref >59)

## 2024-10-26 LAB — CBC WITH DIFFERENTIAL/PLATELET
Basophils Absolute: 0 x10E3/uL (ref 0.0–0.2)
Basos: 0 %
EOS (ABSOLUTE): 0.1 x10E3/uL (ref 0.0–0.4)
Eos: 1 %
Hematocrit: 46.1 % (ref 34.0–46.6)
Hemoglobin: 14.5 g/dL (ref 11.1–15.9)
Immature Grans (Abs): 0 x10E3/uL (ref 0.0–0.1)
Immature Granulocytes: 0 %
Lymphocytes Absolute: 2.4 x10E3/uL (ref 0.7–3.1)
Lymphs: 24 %
MCH: 30.6 pg (ref 26.6–33.0)
MCHC: 31.5 g/dL (ref 31.5–35.7)
MCV: 97 fL (ref 79–97)
Monocytes Absolute: 0.6 x10E3/uL (ref 0.1–0.9)
Monocytes: 6 %
Neutrophils Absolute: 6.9 x10E3/uL (ref 1.4–7.0)
Neutrophils: 69 %
Platelets: 218 x10E3/uL (ref 150–450)
RBC: 4.74 x10E6/uL (ref 3.77–5.28)
RDW: 12.5 % (ref 11.7–15.4)
WBC: 9.9 x10E3/uL (ref 3.4–10.8)

## 2024-10-26 LAB — LIPID PANEL
Chol/HDL Ratio: 2.3 ratio (ref 0.0–4.4)
Cholesterol, Total: 152 mg/dL (ref 100–199)
HDL: 65 mg/dL (ref >39)
LDL Chol Calc (NIH): 69 mg/dL (ref 0–99)
Triglycerides: 97 mg/dL (ref 0–149)
VLDL Cholesterol Cal: 18 mg/dL (ref 5–40)

## 2024-10-26 LAB — VITAMIN D 25 HYDROXY (VIT D DEFICIENCY, FRACTURES): Vit D, 25-Hydroxy: 34.8 ng/mL (ref 30.0–100.0)

## 2024-10-26 LAB — TSH: TSH: 1.3 u[IU]/mL (ref 0.450–4.500)

## 2024-10-26 LAB — HEMOGLOBIN A1C
Est. average glucose Bld gHb Est-mCnc: 197 mg/dL
Hgb A1c MFr Bld: 8.5 % — ABNORMAL HIGH (ref 4.8–5.6)

## 2024-10-31 ENCOUNTER — Encounter

## 2024-11-01 ENCOUNTER — Telehealth: Payer: Self-pay

## 2024-11-01 NOTE — Telephone Encounter (Signed)
 Copied from CRM [id number]. Topic: Clinical - Lab/Test Results >> Nov 01, 2024  9:13 AM Emylou G wrote: Reason for CRM: Carly w/[facility].. said rcvd orders from us , but w/different PCP.SABRA wants to know who signed in so it can be assigned the correct provider.. Pls call her back: [phone]  Spoke with Carly. She's faxing back over the order so correct PCP name can be documented.

## 2024-11-04 ENCOUNTER — Ambulatory Visit (INDEPENDENT_AMBULATORY_CARE_PROVIDER_SITE_OTHER)

## 2024-11-04 VITALS — BP 111/70 | HR 84 | Ht 65.0 in | Wt 224.2 lb

## 2024-11-04 DIAGNOSIS — E890 Postprocedural hypothyroidism: Secondary | ICD-10-CM

## 2024-11-04 DIAGNOSIS — I10 Essential (primary) hypertension: Secondary | ICD-10-CM | POA: Diagnosis not present

## 2024-11-04 DIAGNOSIS — E119 Type 2 diabetes mellitus without complications: Secondary | ICD-10-CM | POA: Diagnosis not present

## 2024-11-04 DIAGNOSIS — N6011 Diffuse cystic mastopathy of right breast: Secondary | ICD-10-CM | POA: Diagnosis not present

## 2024-11-04 DIAGNOSIS — Z7984 Long term (current) use of oral hypoglycemic drugs: Secondary | ICD-10-CM

## 2024-11-04 DIAGNOSIS — R92331 Mammographic heterogeneous density, right breast: Secondary | ICD-10-CM | POA: Diagnosis not present

## 2024-11-04 DIAGNOSIS — Z7985 Long-term (current) use of injectable non-insulin antidiabetic drugs: Secondary | ICD-10-CM

## 2024-11-04 DIAGNOSIS — I272 Pulmonary hypertension, unspecified: Secondary | ICD-10-CM

## 2024-11-04 DIAGNOSIS — R928 Other abnormal and inconclusive findings on diagnostic imaging of breast: Secondary | ICD-10-CM | POA: Diagnosis not present

## 2024-11-04 DIAGNOSIS — G4730 Sleep apnea, unspecified: Secondary | ICD-10-CM

## 2024-11-04 DIAGNOSIS — Z0000 Encounter for general adult medical examination without abnormal findings: Secondary | ICD-10-CM | POA: Insufficient documentation

## 2024-11-04 DIAGNOSIS — E782 Mixed hyperlipidemia: Secondary | ICD-10-CM

## 2024-11-04 DIAGNOSIS — F4320 Adjustment disorder, unspecified: Secondary | ICD-10-CM | POA: Diagnosis not present

## 2024-11-04 LAB — HM MAMMOGRAPHY

## 2024-11-04 NOTE — Assessment & Plan Note (Signed)
 Experiencing typical grief reactions after father's death. Declined counseling but open to resources. - Offered grief counseling resources if needed.

## 2024-11-04 NOTE — Assessment & Plan Note (Signed)
 Routine health maintenance up to date. Mammogram due today. Eye exam completed April 2020. Tetanus up to date. Colonoscopy due 2031. - Complete mammogram today and ensure results are sent to provider. - Plan for flu and pneumonia vaccines after mammogram. - Schedule follow-up appointment in 5-6 months.

## 2024-11-04 NOTE — Assessment & Plan Note (Signed)
 Last lipid panel: LDL 69, HDL 65, Trig 97. CMP stable. Continue rosuvastatin  20 mg daily.

## 2024-11-04 NOTE — Assessment & Plan Note (Signed)
 Follows with sleep medicine. Persistent snoring despite CPAP. Possible mask fit issues or small airway. Considering BiPAP. - Number provided to call [facility] for CPAP evaluation and potential BiPAP trial.

## 2024-11-04 NOTE — Assessment & Plan Note (Signed)
 Followed and managed by endocrinology. Most recent TSH within normal limits. Continue levothyroxine  150 mcg daily until follow-up with endocrinology.

## 2024-11-04 NOTE — Progress Notes (Signed)
 Complete physical exam  Patient: Melanie Steele   DOB: 30-Aug-1961   64 y.o. Female  MRN: [id number]  Subjective:    Chief Complaint  Patient presents with   Annual Exam    History of Present Illness   Melanie Steele is a 63 year old female who presents for CPE.   Glycemic control and antidiabetic medication effects - Diabetes managed with Ozempic  2 mg once weekly, Jardiance  25 mg daily, glipizide  10 mg twice daily, and Tresiba  insulin  20 units at bedtime. - Medication timing adjusted to improve blood glucose control, with noted improvement. - Morning queasiness after Ozempic  administration, resulting in decreased appetite.  Sleep apnea and persistent snoring - Sleep apnea treated with CPAP therapy. - Continued snoring despite CPAP use. - Suspects small airway contributing to ongoing symptoms. -Overall sleeping well despite above complaints  Cutaneous rash - Rash on back occurred three to four weeks ago, described as bumps or scabs. - Symptoms primarily bothersome at night. - Rash resolved spontaneously without intervention.  Thyroid  medication use - Currently taking levothyroxine  and also monitored by endocrinology.   Psychosocial stressors and grief - Managing grief following the unexpected death of her father, who had dementia. - Considering resources for emotional support. - Concerned about feasibility of returning to work five days a week due to her chronic health conditions. Reports that she may reach out to office to request doctor's note allowing flexibility to work from home on occasion still at full time hours.  - Considering retirement next year.          Most recent fall risk assessment:    11/04/2024    9:36 AM  Fall Risk   Falls in the past year? 0  Number falls in past yr: 0  Injury with Fall? 0  Risk for fall due to : No Fall Risks  Follow up Falls evaluation completed     Most recent depression screenings:    11/04/2024    9:36 AM  05/30/2024    8:25 AM  PHQ 2/9 Scores  PHQ - 2 Score 2 1  PHQ- 9 Score 7 7      Data saved with a previous flowsheet row definition    Vision:Within last year and Dental: No current dental problems and Receives regular dental care    Patient Care Team: Gayle Saddie JULIANNA DEVONNA as PCP - General (Physician Assistant) Tobb, Kardie, DO as PCP - Cardiology (Cardiology)   Outpatient Medications Prior to Visit  Medication Sig   amLODipine  (NORVASC ) 10 MG tablet TAKE 1 TABLET(10 MG) BY MOUTH DAILY   Continuous Glucose Sensor (FREESTYLE LIBRE 3 PLUS SENSOR) MISC 1 Device by Other route every 14 (fourteen) days. Change sensor every 15 days.   empagliflozin  (JARDIANCE ) 25 MG TABS tablet Take 1 tablet (25 mg total) by mouth daily.   EPINEPHrine  0.3 mg/0.3 mL IJ SOAJ injection USE AS DIRECTED FOR ALLERGIC REACTION   fexofenadine (ALLEGRA) 180 MG tablet Take 1 tablet by mouth daily.   fluticasone (FLONASE) 50 MCG/ACT nasal spray Place into both nostrils 2 (two) times daily.   gabapentin  (NEURONTIN ) 100 MG capsule Take 1 capsule (100 mg total) by mouth 3 (three) times daily as needed. (Patient not taking: Reported on 11/04/2024)   glipiZIDE  (GLUCOTROL ) 10 MG tablet Take 1 tablet (10 mg total) by mouth 2 (two) times daily before a meal.   glucose blood (ONETOUCH VERIO) test strip Daily   hydrochlorothiazide  (HYDRODIURIL ) 25 MG tablet TAKE 1 TABLET(25 MG) BY  MOUTH DAILY   insulin  degludec (TRESIBA  FLEXTOUCH) 100 UNIT/ML FlexTouch Pen Inject 20 Units into the skin daily.   Insulin  Pen Needle 32G X 4 MM MISC 1 Device by Does not apply route daily in the afternoon.   levothyroxine  (SYNTHROID ) 150 MCG tablet Take 1 tablet (150 mcg total) by mouth daily before breakfast.   meloxicam  (MOBIC ) 7.5 MG tablet Take 1 tablet (7.5 mg total) by mouth daily.   metoprolol  succinate (TOPROL -XL) 50 MG 24 hr tablet TAKE 1 TABLET(50 MG) BY MOUTH DAILY   Multiple Vitamin (MULTIVITAMIN) tablet Take 1 tablet by mouth daily.    potassium chloride  SA (KLOR-CON  M) 20 MEQ tablet TAKE 1 TABLET(20 MEQ) BY MOUTH DAILY   rosuvastatin  (CRESTOR ) 20 MG tablet Take 1 tablet (20 mg total) by mouth daily.   Semaglutide , 2 MG/DOSE, 8 MG/3ML SOPN Inject 2 mg as directed once a week.   No facility-administered medications prior to visit.    ROS   Per HPI     Objective:     BP 111/70   Pulse 84   Ht 5' 5 (1.651 m)   Wt 224 lb 4 oz (101.7 kg)   SpO2 98%   BMI 37.32 kg/m    Physical Exam Constitutional:      General: She is not in acute distress.    Appearance: Normal appearance.  Eyes:     Pupils: Pupils are equal, round, and reactive to light.  Cardiovascular:     Rate and Rhythm: Normal rate and regular rhythm.     Heart sounds: Normal heart sounds. No murmur heard.    No friction rub. No gallop.  Pulmonary:     Effort: Pulmonary effort is normal. No respiratory distress.     Breath sounds: Normal breath sounds.  Abdominal:     General: Bowel sounds are normal.  Musculoskeletal:        General: No swelling.     Cervical back: Neck supple.  Lymphadenopathy:     Cervical: No cervical adenopathy.  Skin:    General: Skin is warm and dry.     Findings: No rash.  Neurological:     General: No focal deficit present.     Mental Status: She is alert.  Psychiatric:        Mood and Affect: Mood normal.        Behavior: Behavior normal.        Thought Content: Thought content normal.      No results found for any visits on 11/04/24. Last CBC Lab Results  Component Value Date   WBC 9.9 10/25/2024   HGB 14.5 10/25/2024   HCT 46.1 10/25/2024   MCV 97 10/25/2024   MCH 30.6 10/25/2024   RDW 12.5 10/25/2024   PLT 218 10/25/2024   Last metabolic panel Lab Results  Component Value Date   GLUCOSE 171 (H) 10/25/2024   NA 142 10/25/2024   K 4.0 10/25/2024   CL 103 10/25/2024   CO2 21 10/25/2024   BUN 14 10/25/2024   CREATININE 0.56 (L) 10/25/2024   EGFR 103 10/25/2024   CALCIUM  9.6 10/25/2024    PROT 7.1 10/25/2024   ALBUMIN 4.5 10/25/2024   LABGLOB 2.6 10/25/2024   AGRATIO 1.6 04/24/2023   BILITOT 0.9 10/25/2024   ALKPHOS 63 10/25/2024   AST 17 10/25/2024   ALT 22 10/25/2024   Last lipids Lab Results  Component Value Date   CHOL 152 10/25/2024   HDL 65 10/25/2024   LDLCALC 69 10/25/2024  TRIG 97 10/25/2024   CHOLHDL 2.3 10/25/2024   Last hemoglobin A1c Lab Results  Component Value Date   HGBA1C 8.5 (H) 10/25/2024   Last thyroid  functions Lab Results  Component Value Date   TSH 1.300 10/25/2024   T4TOTAL 5.6 04/16/2020   FREET4 0.76 (L) 06/06/2020   Last vitamin D  Lab Results  Component Value Date   VD25OH 34.8 10/25/2024        Assessment & Plan:    Routine Health Maintenance and Physical Exam  Health Maintenance  Topic Date Due   Pneumococcal Vaccine for age over 17 (2 of 2 - PCV) 06/12/2012   COVID-19 Vaccine (4 - 2025-26 season) 08/15/2024   Flu Shot  03/14/2025*   Eye exam for diabetics  03/30/2025   Hemoglobin A1C  04/24/2025   Yearly kidney health urinalysis for diabetes  05/23/2025   Complete foot exam   09/09/2025   Yearly kidney function blood test for diabetes  10/25/2025   Breast Cancer Screening  04/11/2026   DTaP/Tdap/Td vaccine (2 - Td or Tdap) 11/07/2026   Colon Cancer Screening  12/30/2029   Hepatitis B Vaccine  Completed   Hepatitis C Screening  Completed   HIV Screening  Completed   Zoster (Shingles) Vaccine  Completed   HPV Vaccine  Aged Out   Meningitis B Vaccine  Aged Out  *Topic was postponed. The date shown is not the original due date.    Discussed health benefits of physical activity, and encouraged her to engage in regular exercise appropriate for her age and condition.  Type 2 diabetes mellitus without complication, without long-term current use of insulin  [facility]) Assessment & Plan: Followed and managed by endocrinology. A1c improved to 8.5.  A1c improved from 9.9% to 8.5% with insulin . Queasiness with Ozempic   affects meal timing. Addressed weight gain concerns with insulin . - Continue Ozempic  2 mg once weekly. - Continue Jardiance  25 mg daily. - Continue glipizide  10 mg twice daily. - Continue Tresiba  20 units at bedtime. - Monitor blood sugar levels and adjust medication timing as needed. - Encouraged dietary adjustments to manage weight gain.    Sleep apnea, unspecified type Assessment & Plan: Follows with sleep medicine. Persistent snoring despite CPAP. Possible mask fit issues or small airway. Considering BiPAP. - Number provided to call [facility] for CPAP evaluation and potential BiPAP trial.   Pulmonary hypertension (HCC) Assessment & Plan: Followed and managed by pulmonology as well as sleep medicine for co-existing sleep apnea.   Mixed hyperlipidemia Assessment & Plan: Last lipid panel: LDL 69, HDL 65, Trig 97. CMP stable. Continue rosuvastatin  20 mg daily.    Essential hypertension Assessment & Plan: BP goal <130/80. Stable, at goal. Continue amlodipine  10 mg daily, hydrochlorothiazide  25 mg daily, metoprolol  succinate 50 mg daily. Most recent CMP within normal limits/fairly stable. Will continue to monitor.    Grief reaction Assessment & Plan: Experiencing typical grief reactions after father's death. Declined counseling but open to resources. - Offered grief counseling resources if needed.   Postablative hypothyroidism Assessment & Plan: Followed and managed by endocrinology. Most recent TSH within normal limits. Continue levothyroxine  150 mcg daily until follow-up with endocrinology.   Healthcare maintenance Assessment & Plan: Routine health maintenance up to date. Mammogram due today. Eye exam completed April 2020. Tetanus up to date. Colonoscopy due 2031. - Complete mammogram today and ensure results are sent to provider. - Plan for flu and pneumonia vaccines after mammogram. - Schedule follow-up appointment in 5-6 months.  Return in about 6  months (around 05/04/2025) for HTN, HLD, DM.     Saddie JULIANNA Sacks, PA-C

## 2024-11-04 NOTE — Assessment & Plan Note (Signed)
 Followed and managed by endocrinology. A1c improved to 8.5.  A1c improved from 9.9% to 8.5% with insulin . Queasiness with Ozempic  affects meal timing. Addressed weight gain concerns with insulin . - Continue Ozempic  2 mg once weekly. - Continue Jardiance  25 mg daily. - Continue glipizide  10 mg twice daily. - Continue Tresiba  20 units at bedtime. - Monitor blood sugar levels and adjust medication timing as needed. - Encouraged dietary adjustments to manage weight gain.

## 2024-11-04 NOTE — Assessment & Plan Note (Signed)
 Followed and managed by pulmonology as well as sleep medicine for co-existing sleep apnea.

## 2024-11-04 NOTE — Assessment & Plan Note (Signed)
 BP goal <130/80.  Stable, at goal.  Continue amlodipine 10 mg daily, hydrochlorothiazide 25 mg daily, metoprolol succinate 50 mg daily.  Most recent CMP within normal limits/fairly stable.  Will continue to monitor.

## 2024-11-04 NOTE — Patient Instructions (Addendum)
 VISIT SUMMARY: Today, you had a follow-up visit to discuss your diabetes, sleep apnea, and other health concerns. We also performed a mammogram.  YOUR PLAN: TYPE 2 DIABETES MELLITUS: Your diabetes is being managed with several medications, and your A1c has improved. However, you are experiencing queasiness with Ozempic , which affects your meal timing. -Continue taking Ozempic  2 mg once weekly, Jardiance  25 mg daily, glipizide  10 mg twice daily, and Tresiba  20 units at bedtime. -Monitor your blood sugar levels and adjust medication timing as needed. -Make dietary adjustments to manage weight gain.  OBSTRUCTIVE SLEEP APNEA: You are experiencing persistent snoring despite using CPAP therapy, possibly due to mask fit issues or a small airway. -You have been referred to Dr. Neda at [facility] for a CPAP evaluation and potential BiPAP trial. The office phone number is  [phone]  HYPERTENSION: Your blood pressure is well-controlled with your current medication. -Continue taking amlodipine , hydrochlorothiazide , and metoprolol  as prescribed.  HYPERLIPIDEMIA: Your cholesterol levels are well-managed with your current treatment. -Continue taking rosuvastatin  as prescribed.  HYPOTHYROIDISM: Your thyroid  condition is well-managed with your current medication. -Continue your current thyroid  medication regimen.  GRIEF: You are experiencing typical grief reactions after the death of your father. You have declined counseling but are open to resources. -Grief counseling resources are available if you need them.  GENERAL HEALTH MAINTENANCE: Your routine health maintenance is up to date. You had a mammogram today, and other screenings are current. -Complete your mammogram today and ensure the results are sent to your provider. -Plan for flu and pneumonia vaccines after your mammogram. -Schedule a follow-up appointment in 5-6 months.  If you have any problems before your next visit feel free  to message me via MyChart (minor issues or questions) or call the office, otherwise you may reach out to schedule an office visit.  Thank you! Saddie Sacks, PA-C

## 2024-11-06 ENCOUNTER — Other Ambulatory Visit: Payer: Self-pay | Admitting: Family Medicine

## 2024-11-06 DIAGNOSIS — I10 Essential (primary) hypertension: Secondary | ICD-10-CM

## 2024-11-07 ENCOUNTER — Other Ambulatory Visit: Payer: Self-pay

## 2024-11-07 MED ORDER — ROSUVASTATIN CALCIUM 20 MG PO TABS: 20.0000 mg | ORAL_TABLET | Freq: Every day | ORAL | 3 refills | Status: DC

## 2024-11-15 ENCOUNTER — Encounter: Payer: Self-pay | Admitting: Cardiology

## 2024-11-15 ENCOUNTER — Ambulatory Visit: Attending: Cardiology | Admitting: Cardiology

## 2024-11-15 VITALS — BP 140/68 | HR 99 | Ht 65.0 in | Wt 226.4 lb

## 2024-11-15 DIAGNOSIS — I2510 Atherosclerotic heart disease of native coronary artery without angina pectoris: Secondary | ICD-10-CM | POA: Diagnosis not present

## 2024-11-15 DIAGNOSIS — I10 Essential (primary) hypertension: Secondary | ICD-10-CM | POA: Diagnosis not present

## 2024-11-15 DIAGNOSIS — G4733 Obstructive sleep apnea (adult) (pediatric): Secondary | ICD-10-CM | POA: Diagnosis not present

## 2024-11-15 DIAGNOSIS — E782 Mixed hyperlipidemia: Secondary | ICD-10-CM

## 2024-11-15 NOTE — Progress Notes (Signed)
 Cardiology Office Note:    Date:  11/15/2024   ID:  Melanie Steele, DOB November 24, 1961, MRN [id number]  PCP:  Gayle Saddie FALCON, PA-C  Cardiologist:  Dub Huntsman, DO  Electrophysiologist:  None   Referring MD: Gayle Saddie FALCON, PA-C    I am experiencing chest pain  History of Present Illness:    Melanie Steele is a 63 y.o. female with a hx of mild coronary artery disease seen on coronary CT scan back in December 2022, type 2 diabetes, sleep apnea on CPAP, hypertension, obesity, hypothyroidism  Since her visit with me she has lost her back.  She is doing well overall.  Past Medical History:  Diagnosis Date   Allergy    Anxiety 04/2023   Family   Arthritis    CPAP (continuous positive airway pressure) dependence    Healthcare maintenance 11/04/2024   Hyperlipidemia    Hypertension    Myocardial infarction [facility])    Neuromuscular disorder (HCC) 2017   Trigger fingers   Oxygen deficiency 2009   CPAP   Sleep apnea    Thyroid  disease    Type 2 diabetes mellitus (HCC)    Vitamin D  deficiency     Past Surgical History:  Procedure Laterality Date   ABDOMINAL HYSTERECTOMY     TONSILLECTOMY     TOOTH EXTRACTION     TRIGGER FINGER RELEASE      Current Medications: Current Meds  Medication Sig   amLODipine  (NORVASC ) 10 MG tablet TAKE 1 TABLET(10 MG) BY MOUTH DAILY   Continuous Glucose Sensor (FREESTYLE LIBRE 3 PLUS SENSOR) MISC 1 Device by Other route every 14 (fourteen) days. Change sensor every 15 days.   empagliflozin  (JARDIANCE ) 25 MG TABS tablet Take 1 tablet (25 mg total) by mouth daily.   EPINEPHrine  0.3 mg/0.3 mL IJ SOAJ injection USE AS DIRECTED FOR ALLERGIC REACTION   fexofenadine (ALLEGRA) 180 MG tablet Take 1 tablet by mouth daily.   fluticasone (FLONASE) 50 MCG/ACT nasal spray Place into both nostrils 2 (two) times daily.   glipiZIDE  (GLUCOTROL ) 10 MG tablet Take 1 tablet (10 mg total) by mouth 2 (two) times daily before a meal.   glucose blood (ONETOUCH VERIO)  test strip Daily   hydrochlorothiazide  (HYDRODIURIL ) 25 MG tablet TAKE 1 TABLET(25 MG) BY MOUTH DAILY   insulin  degludec (TRESIBA  FLEXTOUCH) 100 UNIT/ML FlexTouch Pen Inject 20 Units into the skin daily.   Insulin  Pen Needle 32G X 4 MM MISC 1 Device by Does not apply route daily in the afternoon.   levothyroxine  (SYNTHROID ) 150 MCG tablet Take 1 tablet (150 mcg total) by mouth daily before breakfast.   metoprolol  succinate (TOPROL -XL) 50 MG 24 hr tablet TAKE 1 TABLET(50 MG) BY MOUTH DAILY   Multiple Vitamin (MULTIVITAMIN) tablet Take 1 tablet by mouth daily.   potassium chloride  SA (KLOR-CON  M) 20 MEQ tablet TAKE 1 TABLET(20 MEQ) BY MOUTH DAILY   rosuvastatin  (CRESTOR ) 20 MG tablet Take 1 tablet (20 mg total) by mouth daily.   Semaglutide , 2 MG/DOSE, 8 MG/3ML SOPN Inject 2 mg as directed once a week.     Allergies:   Lisinopril, Other, Linagliptin, and Metformin and related   Social History   Socioeconomic History   Marital status: Widowed    Spouse name: Not on file   Number of children: Not on file   Years of education: Not on file   Highest education level: Bachelor's degree (e.g., BA, AB, BS)  Occupational History   Not on file  Tobacco Use   Smoking status: Never    Passive exposure: Never   Smokeless tobacco: Never   Tobacco comments:    Never smoked anything  Vaping Use   Vaping status: Never Used  Substance and Sexual Activity   Alcohol use: Yes    Alcohol/week: 1.0 standard drink of alcohol    Types: 1 Standard drinks or equivalent per week    Comment: I dont like wine.I corrected these answers   Drug use: Never   Sexual activity: Not Currently    Birth control/protection: Abstinence  Other Topics Concern   Not on file  Social History Narrative   Not on file   Social Drivers of Health   Financial Resource Strain: Low Risk  (10/31/2024)   Overall Financial Resource Strain (CARDIA)    Difficulty of Paying Living Expenses: Not very hard  Food Insecurity: No  Food Insecurity (10/31/2024)   Hunger Vital Sign    Worried About Running Out of Food in the Last Year: Never true    Ran Out of Food in the Last Year: Never true  Transportation Needs: No Transportation Needs (10/31/2024)   PRAPARE - Administrator, Civil Service (Medical): No    Lack of Transportation (Non-Medical): No  Physical Activity: Insufficiently Active (10/31/2024)   Exercise Vital Sign    Days of Exercise per Week: 1 day    Minutes of Exercise per Session: 30 min  Stress: Stress Concern Present (10/31/2024)   [employer] of Occupational Health - Occupational Stress Questionnaire    Feeling of Stress: To some extent  Social Connections: Moderately Integrated (10/31/2024)   Social Connection and Isolation Panel    Frequency of Communication with Friends and Family: More than three times a week    Frequency of Social Gatherings with Friends and Family: Twice a week    Attends Religious Services: 1 to 4 times per year    Active Member of [employer] or Organizations: Yes    Attends Banker Meetings: More than 4 times per year    Marital Status: Widowed     Family History: The patient's family history includes Arthritis in her mother; Colon polyps in her mother; Diabetes in her brother and mother; Hypertension in her brother, mother, and sister; Learning disabilities in her son; Obesity in her daughter; Stroke in her father and mother. There is no history of Colon cancer, Esophageal cancer, Rectal cancer, or Stomach cancer.  ROS:   Review of Systems  Constitution: Negative for decreased appetite, fever and weight gain.  HENT: Negative for congestion, ear discharge, hoarse voice and sore throat.   Eyes: Negative for discharge, redness, vision loss in right eye and visual halos.  Cardiovascular: Reports  chest pain and palpitations.  Negative for dyspnea on exertion, leg swelling, orthopnea.Respiratory: Negative for cough, hemoptysis, shortness of breath  and snoring.   Endocrine: Negative for heat intolerance and polyphagia.  Hematologic/Lymphatic: Negative for bleeding problem. Does not bruise/bleed easily.  Skin: Negative for flushing, nail changes, rash and suspicious lesions.  Musculoskeletal: Negative for arthritis, joint pain, muscle cramps, myalgias, neck pain and stiffness.  Gastrointestinal: Negative for abdominal pain, bowel incontinence, diarrhea and excessive appetite.  Genitourinary: Negative for decreased libido, genital sores and incomplete emptying.  Neurological: Negative for brief paralysis, focal weakness, headaches and loss of balance.  Psychiatric/Behavioral: Negative for altered mental status, depression and suicidal ideas.  Allergic/Immunologic: Negative for HIV exposure and persistent infections.    EKGs/Labs/Other Studies Reviewed:    The  following studies were reviewed today:   EKG: None today   CCTA 12/10/2022 Study quality: Misregistration artifact noted.   Aorta: Normal size.  No calcifications.  No dissection.   Aortic Valve:  Trileaflet.  No calcifications.   Coronary Arteries:  Normal coronary origin.  Right dominance.   RCA is a large dominant artery that gives rise to PDA and PLA. There is misregistration and motion artifact in the RCA resulting in poor visibility through the vessel. There is no obvious calcified plaques, however can not fully rule out soft plaques.   Left main is a large artery that gives rise to LAD and LCX arteries.   LAD is a large torturous vessel with no plaques. There is a very mild myocardial bridging in the mid LAD. D1 with minimal (<24%) calcification in the proximal portion of the vessel.   LCX is a non-dominant artery that gives rise to one large OM1 branch. There is no plaque.   Coronary Calcium  Score:   Left main: 0   Left anterior descending artery: 0, D1 17.8   Left circumflex artery: 0   Right coronary artery: 0   Total: 17.8   Percentile: 82    Other findings:   Normal pulmonary vein drainage into the left atrium.   Normal left atrial appendage without a thrombus.   Normal size of the pulmonary artery.   IMPRESSION: 1. Coronary calcium  score of 17.8. There was 82 percentile for age and sex matched control.   2. Normal coronary origin with right dominance.   3. Minimal Coronary artery disease.   CAD-RADS 1. Minimal non-obstructive CAD (0-24%). Consider non-atherosclerotic causes of chest pain. Consider preventive therapy and risk factor modification.    Zio monitor 11/28/2021 Patch Wear Time:  9 days and 12 hours starting November 15, 2021. Indication: Palpitations Patient had a min HR of 64 bpm, max HR of 131 bpm, and avg HR of 93 bpm. Predominant underlying rhythm was Sinus Rhythm. Isolated SVEs were rare (<1.0%), SVE Couplets were rare (<1.0%), and no SVE Triplets were present. Isolated VEs were rare (<1.0%,  1679), and no VE Couplets or VE Triplets were present.   Symptoms associated with sinus rhythm.   Conclusion: Normal/unremarkable study with no evidence of significant arrhythmia.  TTE 10/18/2021 IMPRESSIONS   1. Left ventricular ejection fraction, by estimation, is 60 to 65%. Left ventricular ejection fraction by 3D volume is 59 %. The left ventricle has  normal function. The left ventricle has no regional wall motion abnormalities. There is mild concentric left ventricular hypertrophy. Left ventricular diastolic parameters are consistent with Grade I diastolic dysfunction (impaired relaxation). The average left ventricular global longitudinal strain is -20.4 %. The global  longitudinal strain is normal.   2. Right ventricular systolic function is normal. The right ventricular  size is normal. Tricuspid regurgitation signal is inadequate for assessing  PA pressure.   3. The mitral valve is grossly normal. No evidence of mitral valve  regurgitation. No evidence of mitral stenosis.   4. The aortic valve is  tricuspid. There is mild calcification of the  aortic valve. Aortic valve regurgitation is mild. No aortic stenosis is  present.   Comparison(s): Prior images unable to be directly viewed, comparison made  by report only. 07/11/21 EF 55-60%.   FINDINGS   Left Ventricle: Left ventricular ejection fraction, by estimation, is 60  to 65%. Left ventricular ejection fraction by 3D volume is 59 %. The left  ventricle has normal function. The left ventricle  has no regional wall  motion abnormalities. The average  left ventricular global longitudinal strain is -20.4 %. The global longitudinal strain is normal. The left ventricular internal cavity size was normal in size. There is mild concentric left ventricular hypertrophy.  Left ventricular diastolic parameters  are consistent with Grade I diastolic dysfunction (impaired relaxation).   Right Ventricle: The right ventricular size is normal. No increase in  right ventricular wall thickness. Right ventricular systolic function is  normal. Tricuspid regurgitation signal is inadequate for assessing PA  pressure.   Left Atrium: Left atrial size was normal in size.   Right Atrium: Right atrial size was normal in size.   Pericardium: Trivial pericardial effusion is present.   Mitral Valve: The mitral valve is grossly normal. No evidence of mitral  valve regurgitation. No evidence of mitral valve stenosis.   Tricuspid Valve: The tricuspid valve is normal in structure. Tricuspid  valve regurgitation is trivial. No evidence of tricuspid stenosis.   Aortic Valve: The aortic valve is tricuspid. There is mild calcification  of the aortic valve. Aortic valve regurgitation is mild. Aortic  regurgitation PHT measures 335 msec. No aortic stenosis is present.   Pulmonic Valve: The pulmonic valve was not well visualized. Pulmonic valve  regurgitation is trivial. No evidence of pulmonic stenosis.   Aorta: The aortic root and ascending aorta are  structurally normal, with  no evidence of dilitation.   IAS/Shunts: The atrial septum is grossly normal.    Recent Labs: 10/25/2024: ALT 22; BUN 14; Creatinine, Ser 0.56; Hemoglobin 14.5; Platelets 218; Potassium 4.0; Sodium 142; TSH 1.300  Recent Lipid Panel    Component Value Date/Time   CHOL 152 10/25/2024 0907   TRIG 97 10/25/2024 0907   HDL 65 10/25/2024 0907   CHOLHDL 2.3 10/25/2024 0907   CHOLHDL 4 02/04/2022 0942   VLDL 36.2 02/04/2022 0942   LDLCALC 69 10/25/2024 0907    Physical Exam:    VS:  BP (!) 140/68 (BP Location: Left Arm, Patient Position: Sitting, Cuff Size: Large)   Pulse 99   Ht 5' 5 (1.651 m)   Wt 226 lb 6.4 oz (102.7 kg)   SpO2 95%   BMI 37.67 kg/m     Wt Readings from Last 3 Encounters:  11/15/24 226 lb 6.4 oz (102.7 kg)  11/04/24 224 lb 4 oz (101.7 kg)  09/09/24 217 lb 6.4 oz (98.6 kg)     GEN: Well nourished, well developed in no acute distress HEENT: Normal NECK: No JVD; No carotid bruits LYMPHATICS: No lymphadenopathy CARDIAC: S1S2 noted,RRR, no murmurs, rubs, gallops RESPIRATORY:  Clear to auscultation without rales, wheezing or rhonchi  ABDOMEN: Soft, non-tender, non-distended, +bowel sounds, no guarding. EXTREMITIES: No edema, No cyanosis, no clubbing MUSCULOSKELETAL:  No deformity  SKIN: Warm and dry NEUROLOGIC:  Alert and oriented x 3, non-focal PSYCHIATRIC:  Normal affect, good insight  ASSESSMENT:    1. OSA (obstructive sleep apnea)   2. Essential hypertension   3. Morbid obesity due to excess calories (HCC)   4. Mild CAD   5. Mixed hyperlipidemia      PLAN:    Hyperlipidemia-lipid panel from November 2025 reviewed, LDL 69.  Continue current dose of lipid-lowering agents.   Type 2 Diabetes Mellitus - hemoglobin A1c 8.5, this is being managed by her primary   Sleep Apnea-on CPAP, but I think she would be a great candidate to be considered for Zepbound.  Will refer her to our pharmacy team     CAD -  no angina  symptoms.   Blood pressure is acceptable, continue with current antihypertensive regimen.   The patient understands the need to lose weight with diet and exercise. We have discussed specific strategies for this.   The patient is in agreement with the above plan. The patient left the office in stable condition.  The patient will follow up in months or sooner if needed.   Medication Adjustments/Labs and Tests Ordered: Current medicines are reviewed at length with the patient today.  Concerns regarding medicines are outlined above.  Orders Placed This Encounter  Procedures   AMB Referral to [facility]    No orders of the defined types were placed in this encounter.    Patient Instructions  Medication Instructions:  Your physician recommends that you continue on your current medications as directed. Please refer to the Current Medication list given to you today.  *If you need a refill on your cardiac medications before your next appointment, please call your pharmacy*   Follow-Up: At [facility] you and your health needs are our priority.  As part of our continuing mission to provide you with exceptional heart care, our providers are all part of one team.  This team includes your primary Cardiologist (physician) and Advanced Practice Providers or APPs (Physician Assistants and Nurse Practitioners) who all work together to provide you with the care you need, when you need it.  Your next appointment:   1 year(s)  Provider:   Keatyn Jawad, DO     Other Instructions Please make an appt with [place] [facility] (pharm-D)    Adopting a Healthy Lifestyle.  Know what a healthy weight is for you (roughly BMI <25) and aim to maintain this   Aim for 7+ servings of fruits and vegetables daily   65-80+ fluid ounces of water or unsweet tea for healthy kidneys   Limit to max 1 drink of alcohol per day; avoid smoking/tobacco   Limit animal fats in diet for cholesterol and heart  health - choose grass fed whenever available   Avoid highly processed foods, and foods high in saturated/trans fats   Aim for low stress - take time to unwind and care for your mental health   Aim for 150 min of moderate intensity exercise weekly for heart health, and weights twice weekly for bone health   Aim for 7-9 hours of sleep daily   When it comes to diets, agreement about the perfect plan isnt easy to find, even among the experts. Experts at the [facility] of [employer] developed an idea known as the Healthy Eating Plate. Just imagine a plate divided into logical, healthy portions.   The emphasis is on diet quality:   Load up on vegetables and fruits - one-half of your plate: Aim for color and variety, and remember that potatoes dont count.   Go for whole grains - one-quarter of your plate: Whole wheat, barley, wheat berries, quinoa, oats, brown rice, and foods made with them. If you want pasta, go with whole wheat pasta.   Protein power - one-quarter of your plate: Fish, chicken, beans, and nuts are all healthy, versatile protein sources. Limit red meat.   The diet, however, does go beyond the plate, offering a few other suggestions.   Use healthy plant oils, such as olive, canola, soy, corn, sunflower and peanut. Check the labels, and avoid partially hydrogenated oil, which have unhealthy trans fats.   If youre thirsty, drink water. Coffee and tea are good in  moderation, but skip sugary drinks and limit milk and dairy products to one or two daily servings.   The type of carbohydrate in the diet is more important than the amount. Some sources of carbohydrates, such as vegetables, fruits, whole grains, and beans-are healthier than others.   Finally, stay active  Signed, Dub Huntsman, DO  11/15/2024 9:56 AM    [facility]

## 2024-11-15 NOTE — Patient Instructions (Addendum)
 Medication Instructions:  Your physician recommends that you continue on your current medications as directed. Please refer to the Current Medication list given to you today.  *If you need a refill on your cardiac medications before your next appointment, please call your pharmacy*   Follow-Up: At [facility], you and your health needs are our priority.  As part of our continuing mission to provide you with exceptional heart care, our providers are all part of one team.  This team includes your primary Cardiologist (physician) and Advanced Practice Providers or APPs (Physician Assistants and Nurse Practitioners) who all work together to provide you with the care you need, when you need it.  Your next appointment:   1 year(s)  Provider:   Kardie Tobb, DO     Other Instructions Please make an appt with [place] [facility] (pharm-D)

## 2024-11-19 ENCOUNTER — Other Ambulatory Visit: Payer: Self-pay

## 2024-11-19 DIAGNOSIS — I10 Essential (primary) hypertension: Secondary | ICD-10-CM

## 2024-12-05 ENCOUNTER — Ambulatory Visit: Payer: Self-pay

## 2024-12-05 NOTE — Telephone Encounter (Signed)
 FYI Only or Action Required?: Action required by provider: clinical question for provider and update on patient condition.  Patient was last seen in primary care on 11/04/2024 by Gayle Saddie FALCON, PA-C.  Called Nurse Triage reporting Cough.  Symptoms began several days ago.  Interventions attempted: OTC medications: Coricidin HBP cold and flu and Rest, hydration, or home remedies.  Symptoms are: unchanged.  Triage Disposition: Home Care  Patient/caregiver understands and will follow disposition?: Yes     Copied from CRM #[id number]. Topic: Clinical - Red Word Triage >> Dec 05, 2024  2:22 PM Amy B wrote: Red Word that prompted transfer to Nurse Triage: headache, nasal congestion, throat tightness, dry cough     Reason for Disposition  Cough with cold symptoms (e.g., runny nose, postnasal drip, throat clearing)  Answer Assessment - Initial Assessment Questions Pt called in with dry cough, h/a and nasal congestion since 12/20 after returning to work from vacation. Pt states she was out of the country and returned to work Tuesday so possible exposures; reports symptoms started Saturday, 12/20. Pt denies chest pain, SOB or fever. Pt is currently taking Coricidin HBP cold and flu. Discussed addition of tylenol sinus to aid with relief of sinus pressure and h/a, humidifier and drinking warm fluids to aid in thinning mucous. Pt states she was not worried about symptoms but wanted to make PCP aware as cough is dry and she does wear a CPAP at night.  Pt did report she did not get her flu shot at her last appt and questioned if she should come in for vaccine. Reassured her I would update PCP on condition. Pt agreeable to do home care at this time.     1. ONSET: When did the cough begin?      Saturday, 12/20  2. SEVERITY: How bad is the cough today?      Dry, hacking cough; I sound like I have a smokers cough  3. SPUTUM: Describe the color of your sputum (e.g., none, dry cough; clear,  white, yellow, green)     Dry   4. HEMOPTYSIS: Are you coughing up any blood? If Yes, ask: How much? (e.g., flecks, streaks, tablespoons, etc.)     None  5. DIFFICULTY BREATHING: Are you having difficulty breathing? If Yes, ask: How bad is it? (e.g., mild, moderate, severe)      Only at night d/t congestion and pt is supposed to wear a cpap   6. FEVER: Do you have a fever? If Yes, ask: What is your temperature, how was it measured, and when did it start?     None    8. LUNG HISTORY: Do you have any history of lung disease?  (e.g., pulmonary embolus, asthma, emphysema)     OSA; uses CPAP  10. OTHER SYMPTOMS: Do you have any other symptoms? (e.g., runny nose, wheezing, chest pain)       H/a, sore throat  12. TRAVEL: Have you traveled out of the country in the last month? (e.g., travel history, exposures)       Yes; out of country last week, vacation to [place]  Protocols used: Cough - Acute Non-Productive-A-AH

## 2024-12-05 NOTE — Telephone Encounter (Signed)
 That is fine. She can come get flu shot one day next week as long as she is fever free.  Recommend OTC covid flu swab. Notify if cough is worsening or she becomes SOB.

## 2024-12-14 ENCOUNTER — Ambulatory Visit

## 2024-12-14 DIAGNOSIS — Z23 Encounter for immunization: Secondary | ICD-10-CM

## 2024-12-20 ENCOUNTER — Ambulatory Visit

## 2025-01-10 NOTE — Progress Notes (Unsigned)
 "  Virtual Visit via Video Note  I connected with [name] on 01/11/25 at 11:30 AM EST by a video enabled telemedicine application and verified that I am speaking with the correct person using two identifiers.   I discussed the limitations of evaluation and management by telemedicine and the availability of in person appointments. The patient expressed understanding and agreed to proceed.   -Location of the patient : Home -Location of the provider : Office -The names of all persons participating in the telemedicine service : Pt and myself       Name: Jera Headings  Age/ Sex: 64 y.o., female 64 y.o., female   MRN/ DOB: [id number], [birth year]     PCP: Gayle Saddie JULIANNA DEVONNA   Reason for Endocrinology Evaluation: Type 2 Diabetes Mellitus  Initial Endocrine Consultative Visit: 08/21/2021    PATIENT IDENTIFIER: Ms. Thresia Ramanathan is a 64 y.o. female with a past medical history of T2DM, OSA, HTN and Dyslipidemia. The patient has followed with Endocrinology clinic since 08/21/2020 for consultative assistance with management of her diabetes.  DIABETIC HISTORY:  Ms. Overall was diagnosed with DM many years ago. Metformin-  Tearing stomach   Her hemoglobin A1c has ranged from 7.9% in 2020, peaking at 9.8% in 2021.  On her initial visit to our clinic she had an A1c 9.8%, she was on Invokana  and Trulicity , we started Glipizide    Started basal insulin  in June, 2025 with an A1c of 9.3%   THYROID  HISTORY: Has been diagnosed with hyperthyroidism in 1993 secondary to Graves' disease . She is S/P RAI ablation, followed by LT- 4 replacement. She was on levothyroxine  initially and switched to armour a few years ago ( ~ 2019) . She has been having issues with adjusting her levels.  By 08/2021 we switched from Armour to Levothyroxine       Father with thyroid  disease    SUBJECTIVE:   During the last visit (09/09/2024): A1c 8.8%      Today (01/10/2025): Ms. Maret is here for a follow up on diabetes and  hypothyroidism. She checks her blood sugars multiple times daily through CGM . The patient has  /not had hypoglycemic episodes since the last clinic visit.   She had a follow-up with cardiology for mild coronary artery disease seen on coronary CT scan. She continues on CPAP for OSA, but the patient endorses sleeping difficulty as well as continued snoring She has no nausea per se, but does not feel like eating breakfast in the morning No changes in bowel movements   HOME ENDOCRINE  REGIMEN:  Glipizide  10 mg ,1 tablet before Breakfast and 1 tablet before supper  Jardiance  25 mg ,1 tablet in the morning Ozempic  2 mg weekly  Tresiba  20 units daily Levothyroxine  150 mcg daily  Rosuvastatin  20 mg daily      Statin: yes ACE-I/ARB: no    CONTINUOUS GLUCOSE MONITORING RECORD INTERPRETATION                       Glycemic patterns summary: Patient has been noted with hyperglycemia throughout the day and night  Hyperglycemic episodes mostly postprandial  Hypoglycemic episodes occurred N/A  Overnight periods: Elevated       DIABETIC COMPLICATIONS: Microvascular complications:   Denies: CKD, retinopathy, neuropathy  Last Eye Exam: Completed 03/30/2024  Macrovascular complications:   Denies: CAD, CVA, PVD   HISTORY:  Past Medical History:  Past Medical History:  Diagnosis Date   Allergy    Anxiety 04/2023  Family   Arthritis    CPAP (continuous positive airway pressure) dependence    Healthcare maintenance 11/04/2024   Hyperlipidemia    Hypertension    Myocardial infarction [facility])    Neuromuscular disorder (HCC) 2017   Trigger fingers   Oxygen deficiency 2009   CPAP   Sleep apnea    Thyroid  disease    Type 2 diabetes mellitus (HCC)    Vitamin D  deficiency    Past Surgical History:  Past Surgical History:  Procedure Laterality Date   ABDOMINAL HYSTERECTOMY     TONSILLECTOMY     TOOTH EXTRACTION     TRIGGER FINGER RELEASE     Social  History:  reports that she has never smoked. She has never been exposed to tobacco smoke. She has never used smokeless tobacco. She reports current alcohol use of about 1.0 standard drink of alcohol per week. She reports that she does not use drugs. Family History:  Family History  Problem Relation Age of Onset   Colon polyps Mother    Diabetes Mother    Hypertension Mother    Arthritis Mother    Stroke Mother    Stroke Father    Hypertension Sister    Diabetes Brother    Hypertension Brother    Learning disabilities Son    Obesity Daughter    Colon cancer Neg Hx    Esophageal cancer Neg Hx    Rectal cancer Neg Hx    Stomach cancer Neg Hx      HOME MEDICATIONS: Allergies as of 01/11/2025       Reactions   Lisinopril Anaphylaxis   Other Anaphylaxis   Tree nuts, grass, seasonal allergies.    Linagliptin Other (See Comments)   Upset stomach   Metformin And Related Diarrhea        Medication List        Accurate as of January 10, 2025  3:27 PM. If you have any questions, ask your nurse or doctor.          amLODipine  10 MG tablet Commonly known as: NORVASC  TAKE 1 TABLET(10 MG) BY MOUTH DAILY   empagliflozin  25 MG Tabs tablet Commonly known as: Jardiance  Take 1 tablet (25 mg total) by mouth daily.   EPINEPHrine  0.3 mg/0.3 mL Soaj injection Commonly known as: EPI-PEN USE AS DIRECTED FOR ALLERGIC REACTION   fexofenadine 180 MG tablet Commonly known as: ALLEGRA Take 1 tablet by mouth daily.   fluticasone 50 MCG/ACT nasal spray Commonly known as: FLONASE Place into both nostrils 2 (two) times daily.   FreeStyle Libre 3 Plus Sensor Misc 1 Device by Other route every 14 (fourteen) days. Change sensor every 15 days.   glipiZIDE  10 MG tablet Commonly known as: GLUCOTROL  Take 1 tablet (10 mg total) by mouth 2 (two) times daily before a meal.   hydrochlorothiazide  25 MG tablet Commonly known as: HYDRODIURIL  TAKE 1 TABLET(25 MG) BY MOUTH DAILY   Insulin  Pen  Needle 32G X 4 MM Misc 1 Device by Does not apply route daily in the afternoon.   levothyroxine  150 MCG tablet Commonly known as: SYNTHROID  Take 1 tablet (150 mcg total) by mouth daily before breakfast.   metoprolol  succinate 50 MG 24 hr tablet Commonly known as: TOPROL -XL TAKE 1 TABLET(50 MG) BY MOUTH DAILY   multivitamin tablet Take 1 tablet by mouth daily.   OneTouch Verio test strip Generic drug: glucose blood Daily   potassium chloride  SA 20 MEQ tablet Commonly known as: KLOR-CON  M TAKE 1  TABLET(20 MEQ) BY MOUTH DAILY   rosuvastatin  20 MG tablet Commonly known as: Crestor  Take 1 tablet (20 mg total) by mouth daily.   Semaglutide  (2 MG/DOSE) 8 MG/3ML Sopn Inject 2 mg as directed once a week.   Tresiba  FlexTouch 100 UNIT/ML FlexTouch Pen Generic drug: insulin  degludec Inject 20 Units into the skin daily.         OBJECTIVE:   Vital Signs: There were no vitals taken for this visit.  Wt Readings from Last 3 Encounters:  11/15/24 226 lb 6.4 oz (102.7 kg)  11/04/24 224 lb 4 oz (101.7 kg)  09/09/24 217 lb 6.4 oz (98.6 kg)     Exam: General: Pt appears well and is in NAD  Neuro: MS is good with appropriate affect, pt is alert and Ox3    DM foot exam:09/09/2024   The skin of the feet is intact without sores or ulcerations. The pedal pulses are 2+ on right and 2+ on left. The sensation is intact to a screening 5.07, 10 gram monofilament bilaterally    DATA REVIEWED:  Lab Results  Component Value Date   HGBA1C 8.5 (H) 10/25/2024   HGBA1C 8.8 (A) 09/09/2024   HGBA1C 9.3 (H) 05/23/2024    Latest Reference Range & Units 10/25/24 09:07  Sodium 134 - 144 mmol/L 142  Potassium 3.5 - 5.2 mmol/L 4.0  Chloride 96 - 106 mmol/L 103  CO2 20 - 29 mmol/L 21  Glucose 70 - 99 mg/dL 828 (H)  BUN 8 - 27 mg/dL 14  Creatinine 9.42 - 8.99 mg/dL 9.43 (L)  Calcium  8.7 - 10.3 mg/dL 9.6  BUN/Creatinine Ratio 12 - 28  25  eGFR >59 mL/min/1.73 103  Alkaline Phosphatase 49  - 135 IU/L 63  Albumin 3.9 - 4.9 g/dL 4.5  AST 0 - 40 IU/L 17  ALT 0 - 32 IU/L 22  Total Protein 6.0 - 8.5 g/dL 7.1  Total Bilirubin 0.0 - 1.2 mg/dL 0.9  Total CHOL/HDL Ratio 0.0 - 4.4 ratio 2.3  Cholesterol, Total 100 - 199 mg/dL 847  HDL Cholesterol >60 mg/dL 65  Triglycerides 0 - 850 mg/dL 97  VLDL Cholesterol Cal 5 - 40 mg/dL 18  LDL Chol Calc (NIH) 0 - 99 mg/dL 69    Old records , labs and images have been reviewed.    ASSESSMENT / PLAN / RECOMMENDATIONS:   1) Type 2 Diabetes Mellitus, Poorly controlled, Without complications - Most recent A1c of 8.5 %. Goal A1c < 7.0 %.   -A1c is trending down but continues to be above goal - Her BGs have been elevated throughout the night and during the day, I will increase basal insulin  at this time - No other changes   MEDICATIONS: - Increase Tresiba  24 units daily - Continue glipizide  10 mg , 1 tablet before breakfast and 1 tablet before Supper  - Continue Jardiance  25 mg daily  - Continue Ozempic  2 mg weekly     EDUCATION / INSTRUCTIONS: BG monitoring instructions: Patient is instructed to check her blood sugars 3 times a day, before meals  Call [facility] if: BG persistently < 70  I reviewed the Rule of 15 for the treatment of hypoglycemia in detail with the patient. Literature supplied.    2) Diabetic complications:  Eye: Does not have known diabetic retinopathy.  Neuro/ Feet: Does not have known diabetic peripheral neuropathy .  Renal: Patient does not have known baseline CKD. She   is not  on an ACEI/ARB at  present.     3) Postablative Hypothyroidism:   - Patient is clinically euthyroid - Most recent TFTs are within normal range Patient confirms today that she takes 2 tablets once a week and 1 tablet rest of the week    Medication:   Continue levothyroxine  150 mcg, 2 tablets on Sundays and 1 tablet rest of the week   4) Dyslipidemia:   -I have increased rosuvastatin  from 10 mg to 20 mg  2024 due to LDL above goal - Most recent LDL is at goal, no change   Medication Continue rosuvastatin  20  mg daily   5) Sleep Issues/OSA:  - Patient encouraged to follow-up with pulmonary to discuss sleep concerns   F/U in 4 months    Signed electronically by: Stefano Redgie Butts, MD  [facility]  [facility] [address] [place], [place] [address] Phone: [phone] FAX: [phone]   CC: Gayle Saddie JULIANNA DEVONNA [address] [place] [address] Phone: [phone]  Fax: [phone]  Return to Endocrinology clinic as below: Future Appointments  Date Time Provider Department Center  01/11/2025 11:30 AM Anissia Wessells, Donell Redgie, MD [facility] None  04/27/2025  8:30 AM [facility]  05/04/2025  8:50 AM Gayle Saddie JULIANNA, PA-C [facility]       "

## 2025-01-11 ENCOUNTER — Telehealth: Admitting: Internal Medicine

## 2025-01-11 ENCOUNTER — Telehealth: Payer: Self-pay | Admitting: Internal Medicine

## 2025-01-11 ENCOUNTER — Encounter: Payer: Self-pay | Admitting: Internal Medicine

## 2025-01-11 VITALS — Ht 65.0 in | Wt 226.0 lb

## 2025-01-11 DIAGNOSIS — E039 Hypothyroidism, unspecified: Secondary | ICD-10-CM | POA: Diagnosis not present

## 2025-01-11 DIAGNOSIS — E1165 Type 2 diabetes mellitus with hyperglycemia: Secondary | ICD-10-CM

## 2025-01-11 DIAGNOSIS — E785 Hyperlipidemia, unspecified: Secondary | ICD-10-CM

## 2025-01-11 MED ORDER — TRESIBA FLEXTOUCH 100 UNIT/ML ~~LOC~~ SOPN: 24.0000 [IU] | PEN_INJECTOR | Freq: Every day | SUBCUTANEOUS | 3 refills | Status: AC

## 2025-01-11 MED ORDER — INSULIN PEN NEEDLE 32G X 4 MM MISC: 1.0000 | Freq: Every day | 3 refills | Status: AC

## 2025-01-11 MED ORDER — LEVOTHYROXINE SODIUM 150 MCG PO TABS: 150.0000 ug | ORAL_TABLET | ORAL | 3 refills | Status: AC

## 2025-01-11 MED ORDER — FREESTYLE LIBRE 3 PLUS SENSOR MISC: 1.0000 | 3 refills | Status: AC

## 2025-01-11 MED ORDER — ROSUVASTATIN CALCIUM 20 MG PO TABS: 20.0000 mg | ORAL_TABLET | Freq: Every day | ORAL | 3 refills | Status: AC

## 2025-01-11 MED ORDER — GLIPIZIDE 10 MG PO TABS: 10.0000 mg | ORAL_TABLET | Freq: Two times a day (BID) | ORAL | 3 refills | Status: AC

## 2025-01-11 MED ORDER — EMPAGLIFLOZIN 25 MG PO TABS: 25.0000 mg | ORAL_TABLET | Freq: Every day | ORAL | 3 refills | Status: AC

## 2025-01-11 MED ORDER — SEMAGLUTIDE (2 MG/DOSE) 8 MG/3ML ~~LOC~~ SOPN: 2.0000 mg | PEN_INJECTOR | SUBCUTANEOUS | 3 refills | Status: AC

## 2025-01-11 NOTE — Telephone Encounter (Signed)
 Can you please schedule this patient for follow-up in 4 months?  Thanks

## 2025-01-15 ENCOUNTER — Other Ambulatory Visit: Payer: Self-pay

## 2025-01-15 DIAGNOSIS — I10 Essential (primary) hypertension: Secondary | ICD-10-CM

## 2025-04-27 ENCOUNTER — Other Ambulatory Visit

## 2025-05-01 ENCOUNTER — Ambulatory Visit: Admitting: Internal Medicine

## 2025-05-04 ENCOUNTER — Ambulatory Visit
